# Patient Record
Sex: Male | Born: 1940 | Race: White | Hispanic: No | Marital: Married | Smoking: Former smoker
Health system: Southern US, Community
[De-identification: ages and names within clinical notes are randomized; demographics above are authoritative.]

## PROBLEM LIST (undated history)

## (undated) DIAGNOSIS — Z9889 Other specified postprocedural states: Secondary | ICD-10-CM

## (undated) DIAGNOSIS — M1990 Unspecified osteoarthritis, unspecified site: Secondary | ICD-10-CM

## (undated) DIAGNOSIS — C4490 Unspecified malignant neoplasm of skin, unspecified: Secondary | ICD-10-CM

## (undated) DIAGNOSIS — E119 Type 2 diabetes mellitus without complications: Secondary | ICD-10-CM

## (undated) DIAGNOSIS — I10 Essential (primary) hypertension: Secondary | ICD-10-CM

## (undated) DIAGNOSIS — J309 Allergic rhinitis, unspecified: Secondary | ICD-10-CM

## (undated) DIAGNOSIS — N189 Chronic kidney disease, unspecified: Secondary | ICD-10-CM

## (undated) DIAGNOSIS — G4733 Obstructive sleep apnea (adult) (pediatric): Secondary | ICD-10-CM

## (undated) DIAGNOSIS — E039 Hypothyroidism, unspecified: Secondary | ICD-10-CM

## (undated) DIAGNOSIS — L989 Disorder of the skin and subcutaneous tissue, unspecified: Secondary | ICD-10-CM

## (undated) DIAGNOSIS — Z9989 Dependence on other enabling machines and devices: Secondary | ICD-10-CM

## (undated) DIAGNOSIS — R42 Dizziness and giddiness: Secondary | ICD-10-CM

## (undated) DIAGNOSIS — I4891 Unspecified atrial fibrillation: Secondary | ICD-10-CM

## (undated) DIAGNOSIS — R112 Nausea with vomiting, unspecified: Secondary | ICD-10-CM

## (undated) HISTORY — PX: TONSILLECTOMY: SUR1361

## (undated) HISTORY — PX: EYE SURGERY: SHX253

## (undated) HISTORY — PX: CARDIAC CATHETERIZATION: SHX172

## (undated) HISTORY — DX: Dependence on other enabling machines and devices: Z99.89

## (undated) HISTORY — DX: Type 2 diabetes mellitus without complications: E11.9

## (undated) HISTORY — DX: Obstructive sleep apnea (adult) (pediatric): G47.33

## (undated) HISTORY — PX: KNEE ARTHROSCOPY: SUR90

## (undated) HISTORY — DX: Unspecified atrial fibrillation: I48.91

## (undated) HISTORY — DX: Allergic rhinitis, unspecified: J30.9

---

## 1978-11-20 HISTORY — PX: OTHER SURGICAL HISTORY: SHX169

## 2004-10-27 ENCOUNTER — Ambulatory Visit: Payer: Self-pay | Admitting: Cardiology

## 2004-10-27 ENCOUNTER — Ambulatory Visit: Payer: Self-pay | Admitting: Endocrinology

## 2004-10-28 ENCOUNTER — Ambulatory Visit: Payer: Self-pay | Admitting: Internal Medicine

## 2004-11-24 ENCOUNTER — Ambulatory Visit: Payer: Self-pay | Admitting: Internal Medicine

## 2004-12-15 ENCOUNTER — Ambulatory Visit: Payer: Self-pay | Admitting: Internal Medicine

## 2005-01-05 ENCOUNTER — Ambulatory Visit: Payer: Self-pay | Admitting: *Deleted

## 2005-01-18 ENCOUNTER — Ambulatory Visit: Payer: Self-pay | Admitting: Endocrinology

## 2005-01-25 ENCOUNTER — Ambulatory Visit: Payer: Self-pay | Admitting: Cardiology

## 2005-01-26 ENCOUNTER — Ambulatory Visit: Payer: Self-pay | Admitting: Endocrinology

## 2005-01-27 ENCOUNTER — Ambulatory Visit: Payer: Self-pay | Admitting: Endocrinology

## 2005-02-09 ENCOUNTER — Ambulatory Visit: Payer: Self-pay | Admitting: Endocrinology

## 2005-02-14 ENCOUNTER — Ambulatory Visit: Payer: Self-pay | Admitting: Internal Medicine

## 2005-02-15 ENCOUNTER — Ambulatory Visit: Payer: Self-pay | Admitting: Internal Medicine

## 2005-02-15 ENCOUNTER — Ambulatory Visit: Payer: Self-pay

## 2005-02-22 ENCOUNTER — Ambulatory Visit: Payer: Self-pay | Admitting: *Deleted

## 2005-03-22 ENCOUNTER — Ambulatory Visit: Payer: Self-pay | Admitting: *Deleted

## 2005-04-06 ENCOUNTER — Ambulatory Visit: Payer: Self-pay | Admitting: Internal Medicine

## 2005-04-28 ENCOUNTER — Ambulatory Visit: Payer: Self-pay | Admitting: Cardiology

## 2005-06-19 ENCOUNTER — Ambulatory Visit: Payer: Self-pay | Admitting: Cardiology

## 2005-07-11 ENCOUNTER — Ambulatory Visit: Payer: Self-pay | Admitting: Cardiology

## 2005-07-17 ENCOUNTER — Ambulatory Visit: Payer: Self-pay | Admitting: Cardiology

## 2005-08-07 ENCOUNTER — Ambulatory Visit: Payer: Self-pay | Admitting: Cardiology

## 2005-09-04 ENCOUNTER — Ambulatory Visit: Payer: Self-pay | Admitting: Internal Medicine

## 2005-09-05 ENCOUNTER — Ambulatory Visit: Payer: Self-pay | Admitting: Endocrinology

## 2005-09-27 ENCOUNTER — Ambulatory Visit: Payer: Self-pay | Admitting: Endocrinology

## 2005-10-05 ENCOUNTER — Ambulatory Visit: Payer: Self-pay | Admitting: Endocrinology

## 2005-10-09 ENCOUNTER — Ambulatory Visit: Payer: Self-pay | Admitting: Cardiology

## 2005-10-24 ENCOUNTER — Ambulatory Visit: Payer: Self-pay | Admitting: Cardiology

## 2005-10-26 ENCOUNTER — Ambulatory Visit: Payer: Self-pay | Admitting: Endocrinology

## 2005-11-27 ENCOUNTER — Ambulatory Visit: Payer: Self-pay | Admitting: Cardiology

## 2005-12-18 ENCOUNTER — Ambulatory Visit: Payer: Self-pay | Admitting: Cardiology

## 2005-12-28 ENCOUNTER — Ambulatory Visit: Payer: Self-pay | Admitting: *Deleted

## 2006-01-25 ENCOUNTER — Ambulatory Visit: Payer: Self-pay | Admitting: Cardiology

## 2006-02-22 ENCOUNTER — Ambulatory Visit: Payer: Self-pay | Admitting: Cardiology

## 2006-03-26 ENCOUNTER — Ambulatory Visit: Payer: Self-pay | Admitting: Cardiology

## 2006-04-23 ENCOUNTER — Ambulatory Visit: Payer: Self-pay | Admitting: Cardiology

## 2006-05-15 ENCOUNTER — Ambulatory Visit: Payer: Self-pay | Admitting: Cardiovascular Disease

## 2006-06-12 ENCOUNTER — Ambulatory Visit: Payer: Self-pay | Admitting: *Deleted

## 2006-06-26 ENCOUNTER — Ambulatory Visit: Payer: Self-pay | Admitting: Cardiology

## 2006-07-24 ENCOUNTER — Ambulatory Visit: Payer: Self-pay | Admitting: Cardiovascular Disease

## 2006-08-21 ENCOUNTER — Ambulatory Visit: Payer: Self-pay | Admitting: *Deleted

## 2006-09-11 ENCOUNTER — Ambulatory Visit: Payer: Self-pay | Admitting: Internal Medicine

## 2006-09-19 ENCOUNTER — Ambulatory Visit: Payer: Self-pay | Admitting: Internal Medicine

## 2006-09-20 ENCOUNTER — Ambulatory Visit (HOSPITAL_BASED_OUTPATIENT_CLINIC_OR_DEPARTMENT_OTHER): Admission: RE | Admit: 2006-09-20 | Discharge: 2006-09-20 | Payer: Self-pay | Admitting: Internal Medicine

## 2006-09-23 ENCOUNTER — Ambulatory Visit: Payer: Self-pay | Admitting: Internal Medicine

## 2006-10-16 ENCOUNTER — Ambulatory Visit: Payer: Self-pay | Admitting: Internal Medicine

## 2006-10-30 ENCOUNTER — Encounter (HOSPITAL_BASED_OUTPATIENT_CLINIC_OR_DEPARTMENT_OTHER): Admission: RE | Admit: 2006-10-30 | Discharge: 2006-11-08 | Payer: Self-pay | Admitting: Surgery

## 2006-11-23 ENCOUNTER — Encounter (HOSPITAL_BASED_OUTPATIENT_CLINIC_OR_DEPARTMENT_OTHER): Admission: RE | Admit: 2006-11-23 | Discharge: 2007-02-21 | Payer: Self-pay | Admitting: Internal Medicine

## 2007-04-16 ENCOUNTER — Ambulatory Visit: Payer: Self-pay | Admitting: Internal Medicine

## 2008-04-10 DIAGNOSIS — J302 Other seasonal allergic rhinitis: Secondary | ICD-10-CM

## 2008-04-10 DIAGNOSIS — J4540 Moderate persistent asthma, uncomplicated: Secondary | ICD-10-CM | POA: Insufficient documentation

## 2008-04-10 DIAGNOSIS — J3089 Other allergic rhinitis: Secondary | ICD-10-CM

## 2008-04-14 ENCOUNTER — Ambulatory Visit: Payer: Self-pay | Admitting: Internal Medicine

## 2008-04-23 DIAGNOSIS — G4733 Obstructive sleep apnea (adult) (pediatric): Secondary | ICD-10-CM

## 2008-04-23 DIAGNOSIS — E119 Type 2 diabetes mellitus without complications: Secondary | ICD-10-CM

## 2009-04-14 ENCOUNTER — Ambulatory Visit: Payer: Self-pay | Admitting: Internal Medicine

## 2009-06-02 ENCOUNTER — Telehealth (INDEPENDENT_AMBULATORY_CARE_PROVIDER_SITE_OTHER): Payer: Self-pay | Admitting: *Deleted

## 2010-04-13 ENCOUNTER — Ambulatory Visit: Payer: Self-pay | Admitting: Internal Medicine

## 2010-06-20 ENCOUNTER — Encounter: Payer: Self-pay | Admitting: Cardiology

## 2010-07-06 ENCOUNTER — Encounter: Payer: Self-pay | Admitting: Cardiology

## 2010-07-12 DIAGNOSIS — I10 Essential (primary) hypertension: Secondary | ICD-10-CM | POA: Insufficient documentation

## 2010-07-12 DIAGNOSIS — I4891 Unspecified atrial fibrillation: Secondary | ICD-10-CM

## 2010-07-12 DIAGNOSIS — E785 Hyperlipidemia, unspecified: Secondary | ICD-10-CM

## 2010-07-13 ENCOUNTER — Ambulatory Visit: Payer: Self-pay | Admitting: Cardiology

## 2010-07-13 DIAGNOSIS — R0609 Other forms of dyspnea: Secondary | ICD-10-CM

## 2010-07-14 ENCOUNTER — Ambulatory Visit: Payer: Self-pay | Admitting: Cardiology

## 2010-07-14 ENCOUNTER — Encounter (INDEPENDENT_AMBULATORY_CARE_PROVIDER_SITE_OTHER): Payer: Self-pay | Admitting: *Deleted

## 2010-07-14 LAB — CONVERTED CEMR LAB
Basophils Relative: 1.2 % (ref 0.0–3.0)
CO2: 26 meq/L (ref 19–32)
Calcium: 9.2 mg/dL (ref 8.4–10.5)
Chloride: 105 meq/L (ref 96–112)
Creatinine, Ser: 1.5 mg/dL (ref 0.4–1.5)
Eosinophils Absolute: 0.3 10*3/uL (ref 0.0–0.7)
Eosinophils Relative: 3.1 % (ref 0.0–5.0)
Hemoglobin: 15.3 g/dL (ref 13.0–17.0)
INR: 1.2 — ABNORMAL HIGH (ref 0.8–1.0)
Lymphocytes Relative: 28.7 % (ref 12.0–46.0)
MCHC: 33.8 g/dL (ref 30.0–36.0)
MCV: 95.2 fL (ref 78.0–100.0)
Neutro Abs: 5.1 10*3/uL (ref 1.4–7.7)
Neutrophils Relative %: 59.9 % (ref 43.0–77.0)
Prothrombin Time: 12.7 s — ABNORMAL HIGH (ref 9.7–11.8)
RBC: 4.75 M/uL (ref 4.22–5.81)
Sodium: 138 meq/L (ref 135–145)
WBC: 8.6 10*3/uL (ref 4.5–10.5)

## 2010-07-20 ENCOUNTER — Inpatient Hospital Stay (HOSPITAL_BASED_OUTPATIENT_CLINIC_OR_DEPARTMENT_OTHER): Admission: RE | Admit: 2010-07-20 | Discharge: 2010-07-20 | Payer: Self-pay | Admitting: Cardiovascular Disease

## 2010-07-20 ENCOUNTER — Ambulatory Visit: Payer: Self-pay | Admitting: Cardiovascular Disease

## 2010-08-16 ENCOUNTER — Ambulatory Visit: Payer: Self-pay | Admitting: Cardiology

## 2010-08-17 ENCOUNTER — Encounter: Payer: Self-pay | Admitting: Cardiology

## 2010-09-22 ENCOUNTER — Ambulatory Visit: Payer: Self-pay | Admitting: Internal Medicine

## 2010-10-20 HISTORY — PX: JOINT REPLACEMENT: SHX530

## 2010-11-02 ENCOUNTER — Inpatient Hospital Stay (HOSPITAL_COMMUNITY)
Admission: RE | Admit: 2010-11-02 | Discharge: 2010-11-06 | Payer: Self-pay | Source: Home / Self Care | Attending: Orthopedic Surgery | Admitting: Orthopedic Surgery

## 2010-12-20 NOTE — Medication Information (Signed)
Summary: [facility] Medication Refill Log   [facility] Medication Refill Log   Imported By: Roderic Ovens 09/22/2010 12:21:47  _____________________________________________________________________  External Attachment:    Type:   Image     Comment:   External Document

## 2010-12-20 NOTE — Assessment & Plan Note (Signed)
Summary: SOB/ MBW   Copy to:  Buren Kos Primary Provider/Referring Provider:  Buren Kos  CC:  Follow up pt c/o increase sob with exertion and cardiac catherization in sept 2011.  History of Present Illness: 04/14/09- OSA, allergic rhinitis, asthma Asks a bout increasing cpap. He wakes unrested more often. Using a full face mask.  Frequent colds this winter- only a little asthma. Got through Spring pollen with continuous use of all meds- reviewed. Controls edema with support hose and diuretics. Currently feels very well.  Apr 13, 2010- OSA, allergic rhinitis, asthma, AF/ Pradaxa No active asthma, except a flare after climbing stairs in [place]- rescue inhaler worked. After that he used rescue inhaler ahead of exertion for rest of trip to be safe. Ambulation limited by his arthritis and DM. He asks I fill celebrex til he can see Dr [name] next month. Spring pollen cause watery rhinorhea- controlled with Claritin then.  Had a sustained flu syndrome last winter- did get flu shot. Sleep apnea- Uses CPAP 12 almost every night. He doesn't take the cpap on trips out of town and we discussed this. Pressure setting now is good.  September 22, 2010- OSA, allergic rhinitis, asthma, AF/ Pradaxa Nurse-CC: Follow up pt c/o increase sob with exertion, cardiac catherization in sept 2011 Fully compliant with CPAP w/o change. Had been exercising regularly in July. On return from [employer] ad asthma episode managed with his meds. After that never seemed to get over lingering dyspnea, worse than ususal. Had heart cath in September"OK".- widely patent coronaries, EF 55%. EKG-AFib w/ slow ventricular response. CXR for routine physical was ok. Pending knee surgery in December. Now nasal congestion- uses Neti pot. Notes mainly exertional dyspnea w/o wheeze or cough. Not anemic.     Asthma History    Initial Asthma Severity Rating:    Age range: 12+ years    Symptoms: daily    Nighttime Awakenings: 0-2/month    Interferes w/ normal activity: minor limitations    SABA use (not for EIB): >2 days/week but not >1X/day    Asthma Severity Assessment: Moderate Persistent   Preventive Screening-Counseling & Management  Alcohol-Tobacco     Smoking Status: quit     Packs/Day: 1.0     Year Started: 1956     Year Quit: 1991  Medications Prior to Update: 1)  Potassium Chloride Crys Cr 20 Meq Tbcr (Potassium Chloride Crys Cr) .... Take 1 Tablet By Mouth Once A Day 2)  Pradaxa 150 Mg Caps (Dabigatran Etexilate Mesylate) .[name].. 1 Cap Two Times A Day 3)  Lipitor 80 Mg  Tabs (Atorvastatin Calcium) .... Take 1 By Mouth Once Daily 4)  Singulair 10 Mg  Tabs (Montelukast Sodium) .... 2 Tabs At Bedtime 5)  Coq10 100 Mg  Caps (Coenzyme Q10) .... Take Once Daily 6)  Hydrochlorothiazide 12.5 Mg  Tabs (Hydrochlorothiazide) .... Take 1 By Mouth Once Daily 7)  Accupril 40 Mg  Tabs (Quinapril Hcl) .... 2 Tab At Bedtime 8)  Furosemide 20 Mg Tabs (Furosemide) .[name].. 1 Tab Once Daily 9)  Metformin Hcl 500 Mg Tabs (Metformin Hcl) .... Take 1 Tablet By Mouth Two Times A Day 10)  Victoza 18 Mg/11ml Soln (Liraglutide) .... 1.8 Units Once Daily 11)  Protonix 20 Mg  Tbec (Pantoprazole Sodium) .... Two Times A Day 12)  Advair Diskus 250-50 Mcg/dose  Misc (Fluticasone-Salmeterol) .[name].. 1 Puff Two Times A Day As Needed 13)  Proair Hfa 108 (90 Base) Mcg/act  Aers (Albuterol Sulfate) .... 2  Puffs Four Times A Day As Needed 14)  Cpap 12  [facility] .... At Bedtime 15)  Skelaxin 800 Mg  Tabs (Metaxalone) .... As Needed 16)  Endocet 7.5-325 Mg  Tabs (Oxycodone-Acetaminophen) .... As Needed 17)  Verelan Pm 300 Mg  Cp24 (Verapamil Hcl) .... Once Daily 18)  Androgel Pump 1 %  Gel (Testosterone) .... Once Daily 19)  Creon 10 33.12-31-35.5 Mu  Cpep (Amylase-Lipase-Protease) .... Once Daily 20)  Astelin 137 Mcg/spray  Soln (Azelastine Hcl) .... Once Daily 21)  Fluticasone Propionate 50 Mcg/act  Susp (Fluticasone Propionate) .... Once  Daily 22)  Vitamin B-12 100 Mcg  Tabs (Cyanocobalamin) .... Once Daily 23)  Safflower Caps .Marland KitchenMarland Kitchen. 1 Cap Two Times A Day 24)  Accuflora .... Once Daily 25)  Metoprolol Succinate 100 Mg Xr24h-Tab (Metoprolol Succinate) .... Take 1 Tablet By Mouth Two Times A Day 26)  Cinnamon/chrouim .... Take 1 By Mouth Once Daily 27)  Synthroid 50 Mcg Tabs (Levothyroxine Sodium) .[name].. 1 Tab Once Daily 28)  Meloxicam 15 Mg Tabs (Meloxicam) .[name].. 1 Tab Once Daily 29)  Qualaquin 324 Mg Caps (Quinine Sulfate) .[name].. 1 Tab At Bedtime As Needed  Allergies (verified): No Known Drug Allergies  Past History:  Past Medical History: Last updated: 04/14/2008 Allergic Rhinitis Asthma Atrial Fibrillation Sleep Apnea Diabetes, Type 2  Past Surgical History: Last updated: 04/14/2009 Knee arthroscopy Tonsillectomy repair lacerated finger Cardiac cath  Family History: Last updated: 04/14/2008 emphysema-father heart disease-mpther,father rheumatism-father cancer-mother-breast mother died at 27 of respiratory failure father died at 18 of respiratory failure sister-71-hypertension brother-64 emphysema  Social History: Last updated: 04/13/2010 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 remote  Risk Factors: Alcohol Use: 2 (04/14/2008)  Risk Factors: Smoking Status: quit (09/22/2010) Packs/Day: 1.0 (09/22/2010)  Social History: Smoking Status:  quit Packs/Day:  1.0  Review of Systems      See HPI       The patient complains of dyspnea on exertion and peripheral edema.  The patient denies anorexia, fever, weight loss, weight gain, vision loss, decreased hearing, hoarseness, chest pain, syncope, prolonged cough, headaches, hemoptysis, abdominal pain, melena, severe indigestion/heartburn, muscle weakness, enlarged lymph nodes, and angioedema.    Vital Signs:  Patient profile:   70 year old male Height:      68 inches Weight:      259.4  pounds BMI:     39.58 O2 Sat:      97 % on Room air Pulse rate:   51 / minute BP sitting:   130 / 78  (left arm) Cuff size:   large  Vitals Entered By: Renold Genta RCP, LPN (September 22, 2010 11:08 AM)  O2 Flow:  Room air CC: Follow up pt c/o increase sob with exertion, cardiac catherization in sept 2011 Comments Medications reviewed with patient Renold Genta RCP, LPN  September 22, 2010 11:13 AM    Physical Exam  Additional Exam:  General: A/Ox3; pleasant and cooperative, NAD,  SKIN: no rash, lesions NODES: no lymphadenopathy HEENT: [place]/AT, EOM- WNL, Conjuctivae- clear, PERRLA, TM-WNL, Nose- clear, Throat- clear and wnl, Mallampati III-IV NECK: Supple w/ fair ROM, JVD- none, normal carotid impulses w/o bruits Thyroid- , no stridor CHEST: Clear to P&A HEART: RRR- slow but regular, no m/g/r heard ABDOMEN: overweight ZOX:WRUE, nl pulses, cane, 1+ edema L>R NEURO: Grossly intact to observation      Impression & Recommendations:  Problem # 1:  DYSPNEA (ICD-786.05)  Dyspnea  with change of pattern since [place] trip. Negative Homan's. I will check D-dimer. He is getting better over time, and I will ok him for planned knee surgery at this point. Note that resting sat is now 97% w/ no wheeze and negative Homan's. His heart cath didn't see any redflags. Inhaler helps some, but not enough. We will try to get a 6 MWT if his  knees can do that.       ADDENDUM: 6 minute Walk Test: done today- 98% at start, 96% at end, 100% after 2 minutes rest; no stops. Good oxygenation.                    Conclusion: Dyspnea probably from deconditioning and obesity. No contraindication to knee surgery.  Had flu vax.  Other Orders: Est. Patient Level IV (11914) T-D-Dimer Fibrin Derivatives Quantitive ([id number]) Pulmonary Stress (6 min walk) (86578)  Patient Instructions: 1)  Please schedule a follow-up appointment in 6 months. Call sooner as needed. 2)  Lab 3)  6 Minute walk  test   Six Minute Walk Test Medications taken before test(dose and time): 1)  Potassium Chloride Crys Cr 20 Meq Tbcr (Potassium Chloride Crys Cr) .... Take 1 Tablet By Mouth Once A Day 2)  Pradaxa 150 Mg Caps (Dabigatran Etexilate Mesylate) .[name].. 1 Cap Two Times A Day 3)  Lipitor 80 Mg  Tabs (Atorvastatin Calcium) .... Take 1 By Mouth Once Daily 4)  Singulair 10 Mg  Tabs (Montelukast Sodium) .... 2 Tabs At Bedtime 5)  Coq10 100 Mg  Caps (Coenzyme Q10) .... Take Once Daily 6)  Hydrochlorothiazide 12.5 Mg  Tabs (Hydrochlorothiazide) .... Take 1 By Mouth Once Daily 7)  Accupril 40 Mg  Tabs (Quinapril Hcl) .... 2 Tab At Bedtime 8)  Furosemide 20 Mg Tabs (Furosemide) .[name].. 1 Tab Once Daily 9)  Metformin Hcl 500 Mg Tabs (Metformin Hcl) .... Take 1 Tablet By Mouth Two Times A Day 10)  Victoza 18 Mg/63ml Soln (Liraglutide) .... 1.8 Units Once Daily 11)  Protonix 20 Mg  Tbec (Pantoprazole Sodium) .... Two Times A Day 12)  Advair Diskus 250-50 Mcg/dose  Misc (Fluticasone-Salmeterol) .[name].. 1 Puff Two Times A Day As Needed 13)  Proair Hfa 108 (90 Base) Mcg/act  Aers (Albuterol Sulfate) .... 2 Puffs Four Times A Day As Needed 14)  Cpap 12  [facility] .... At Bedtime 15)  Skelaxin 800 Mg  Tabs (Metaxalone) .... As Needed 16)  Endocet 7.5-325 Mg  Tabs (Oxycodone-Acetaminophen) .... As Needed 17)  Verelan Pm 300 Mg  Cp24 (Verapamil Hcl) .... Once Daily 18)  Androgel Pump 1 %  Gel (Testosterone) .... Once Daily 19)  Creon 10 33.12-31-35.5 Mu  Cpep (Amylase-Lipase-Protease) .... Once Daily 20)  Astelin 137 Mcg/spray  Soln (Azelastine Hcl) .... Once Daily 21)  Fluticasone Propionate 50 Mcg/act  Susp (Fluticasone Propionate) .... Once Daily 22)  Vitamin B-12 100 Mcg  Tabs (Cyanocobalamin) .... Once Daily 23)  Safflower Caps .Marland KitchenMarland Kitchen. 1 Cap Two Times A Day PT TOOK ALL AM MEDS AT APPROX 8AM TODAY Supplemental oxygen during the test: No Flow: Room air  Lap counter(place a tick mark inside a square for each  lap completed) lap 1 complete  lap 2 complete   lap 3 complete   lap 4 complete  lap 5 complete  lap 6 complete  lap 7 complete    Baseline  BP sitting: 130/ 78 Heart rate: 51 Dyspnea ( Borg scale) 0 Fatigue (Borg scale) 1  SPO2 98  End Of Test  BP sitting: 140/ 82 Heart rate: 78 Dyspnea ( Borg scale) 0.5 Fatigue (Borg scale) 1 SPO2 96  2 Minutes post  BP sitting: 140/ 84 Heart rate: 56 SPO2 100  Stopped or paused before six minutes? No  Interpretation: Number of laps  7 X 48 meters =   336 meters =    336 meters   Total distance walked in six minutes: 336 meters  Tech ID: Tivis Ringer, CNA (September 22, 2010 4:04 PM) Tech Comments Pt completed test w/ 0 rest breaks and 0 complaints. Armeta performed test and I am documenting this for her.

## 2010-12-20 NOTE — Assessment & Plan Note (Signed)
Summary: eph/jml   Visit Type:  EPH Referring Provider:  Buren Kos Primary Provider:  Buren Kos  CC:  pt states he is having Right total knee replacement in 10/2010 w/Dr. Lequita Halt....sob....denies any cp or edema.Colton KitchenMarland Kitchenpt has lost 11 lb's since 06/2010.  History of Present Illness: Colton Brooks returns today after having a cardiac catheterization. He had widely patent coronary vessels. His chest discomfort and/or his shortness of breath developing noncardiac. He had a normal left ventricular EF of 55%. There were no wall motion analysis.  He needs preop clearance for a right total knee replacement by Dr. Despina Hick in December. He will have his left knee done after that. He is also cleared a low operative risk. He will have to stop his Pradaxa 4 days prior to surgery.  Clinical Reports Reviewed:  Cardiac Cath:  07/20/2010: Cardiac Cath Findings:   Left ventriculography performed in the RAO projection shows normal LV   function.  The EF is estimated at 55%.  There are no regional wall   motion abnormalities.      Coronary angiography.  The right coronary artery is smooth throughout   its course.  It is a dominant vessel that supplies a PDA branch as well   as 2 posterolateral branches.  The first posterolateral branch is   moderate. The second PL is small.  PDA is fairly large.  The RCA is   smooth throughout its course and there appears to be no luminal   irregularity or significant obstructive disease.      Left mainstem.  Widely patent.  Divides into the LAD and left   circumflex.      LAD:  The LAD is also smooth throughout its course.  There are no   luminal irregularities or significant stenoses identified.  The LAD   gives off two moderate-sized diagonal branches, both of which are   patent.      Left circumflex:  Left circumflex is also patent throughout its course.   There is no obstructive disease identified.  Circumflex gives off its   largest branch in an OM-2 territory.        ASSESSMENT:   1. Widely patent coronary arteries.   2. Normal left ventricular function.               Colton Brooks. Excell Seltzer, MD       Current Medications (verified): 1)  Potassium Chloride Crys Cr 20 Meq Tbcr (Potassium Chloride Crys Cr) .... Take 1 Tablet By Mouth Once A Day 2)  Pradaxa 150 Mg Caps (Dabigatran Etexilate Mesylate) .Colton Brooks.. 1 Cap Two Times A Day 3)  Lipitor 80 Mg  Tabs (Atorvastatin Calcium) .... Take 1 By Mouth Once Daily 4)  Singulair 10 Mg  Tabs (Montelukast Sodium) .... 2 Tabs At Bedtime 5)  Coq10 100 Mg  Caps (Coenzyme Q10) .... Take Once Daily 6)  Hydrochlorothiazide 12.5 Mg  Tabs (Hydrochlorothiazide) .... Take 1 By Mouth Once Daily 7)  Accupril 40 Mg  Tabs (Quinapril Hcl) .... 2 Tab At Bedtime 8)  Furosemide 20 Mg Tabs (Furosemide) .Colton Brooks.. 1 Tab Once Daily 9)  Metformin Hcl 500 Mg Tabs (Metformin Hcl) .... Take 1 Tablet By Mouth Two Times A Day 10)  Victoza 18 Mg/45ml Soln (Liraglutide) .... 1.8 Units Once Daily 11)  Protonix 20 Mg  Tbec (Pantoprazole Sodium) .... Two Times A Day 12)  Advair Diskus 250-50 Mcg/dose  Misc (Fluticasone-Salmeterol) .Colton Brooks.. 1 Puff Two Times A Day As Needed 13)  Proair Hfa 108 (  90 Base) Mcg/act  Aers (Albuterol Sulfate) .... 2 Puffs Four Times A Day As Needed 14)  Cpap 12  [facility] .... At Bedtime 15)  Skelaxin 800 Mg  Tabs (Metaxalone) .... As Needed 16)  Endocet 7.5-325 Mg  Tabs (Oxycodone-Acetaminophen) .... As Needed 17)  Verelan Pm 300 Mg  Cp24 (Verapamil Hcl) .... Once Daily 18)  Androgel Pump 1 %  Gel (Testosterone) .... Once Daily 19)  Creon 10 33.12-31-35.5 Mu  Cpep (Amylase-Lipase-Protease) .... Once Daily 20)  Astelin 137 Mcg/spray  Soln (Azelastine Hcl) .... Once Daily 21)  Fluticasone Propionate 50 Mcg/act  Susp (Fluticasone Propionate) .... Once Daily 22)  Vitamin B-12 100 Mcg  Tabs (Cyanocobalamin) .... Once Daily 23)  Safflower Caps .Colton KitchenMarland Brooks. 1 Cap Two Times A Day 24)  Accuflora .... Once Daily 25)  Metoprolol  Succinate 100 Mg Xr24h-Tab (Metoprolol Succinate) .... Take 1 Tablet By Mouth Two Times A Day 26)  Cinnamon/chrouim .... Take 1 By Mouth Once Daily 27)  Synthroid 50 Mcg Tabs (Levothyroxine Sodium) .Colton Brooks.. 1 Tab Once Daily 28)  Meloxicam 15 Mg Tabs (Meloxicam) .Colton Brooks.. 1 Tab Once Daily 29)  Qualaquin 324 Mg Caps (Quinine Sulfate) .Colton Brooks.. 1 Tab At Bedtime As Needed  Allergies (verified): No Known Drug Allergies  Past History:  Past Medical History: Last updated: 04/14/2008 Allergic Rhinitis Asthma Atrial Fibrillation Sleep Apnea Diabetes, Type 2  Past Surgical History: Last updated: 04/14/2009 Knee arthroscopy Tonsillectomy repair lacerated finger Cardiac cath  Family History: Last updated: 04/14/2008 emphysema-father heart disease-mpther,father rheumatism-father cancer-mother-breast mother died at 24 of respiratory failure father died at 5 of respiratory failure sister-71-hypertension brother-64 emphysema  Social History: Last updated: 04/13/2010 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 No problem catheter site.  Vital Signs:  Patient profile:   70 year old male Height:      68 inches Weight:      248 pounds BMI:     37.84 Pulse rate:   58 / minute Pulse rhythm:   irregular BP sitting:   144 / 90  (left arm) Cuff size:   large  Vitals Entered By: Danielle Rankin, CMA (August 16, 2010 12:08 PM)  Physical Exam  General:  obese.   Head:  normocephalic and atraumatic Eyes:  PERRLA/EOM intact; conjunctiva and lids normal. Neck:  Neck supple, no JVD. No masses, thyromegaly or abnormal cervical nodes. Chest Wall:  no deformities or breast masses noted Lungs:  Clear bilaterally to auscultation and percussion. Heart:  PMI  nondisplaced, regular rate and rhythm, normal S1-S2, carotids full without bruit Msk:  decreased ROM.   Pulses:  pulses normal in all 4 extremities Extremities:  No clubbing or cyanosis. Neurologic:  Alert and oriented x 3. Skin:  Intact without lesions or rashes. Psych:  Normal affect.   EKG  Procedure date:  08/16/2010  Findings:      chronic atrial fibrillation with a well-controlled ventricular rate  Impression & Recommendations:  Problem # 1:  PRE-OPERATIVE CARDIOVASCULAR EXAMINATION (ICD-V72.81) We have given the patient a letter clearing him for surgery at low risk. Followup in 2 years. The following medications were removed from the medication list:    Aspirin Ec 325 Mg Tbec (Aspirin) .Colton Brooks... Take one tablet by mouth daily His updated medication  list for this problem includes:    Accupril 40 Mg Tabs (Quinapril hcl) .Colton Brooks... 2 tab at bedtime    Verelan Pm 300 Mg Cp24 (Verapamil hcl) ..... Once daily    Metoprolol Succinate 100 Mg Xr24h-tab (Metoprolol succinate) .Colton Brooks... Take 1 tablet by mouth two times a day  Orders: EKG w/ Interpretation (93000)  Problem # 2:  DYSPNEA (ICD-786.05) Assessment: Unchanged  The following medications were removed from the medication list:    Aspirin Ec 325 Mg Tbec (Aspirin) .Colton Brooks... Take one tablet by mouth daily His updated medication list for this problem includes:    Hydrochlorothiazide 12.5 Mg Tabs (Hydrochlorothiazide) .Colton Brooks... Take 1 by mouth once daily    Accupril 40 Mg Tabs (Quinapril hcl) .Colton Brooks... 2 tab at bedtime    Furosemide 20 Mg Tabs (Furosemide) .Colton Brooks... 1 tab once daily    Verelan Pm 300 Mg Cp24 (Verapamil hcl) ..... Once daily    Metoprolol Succinate 100 Mg Xr24h-tab (Metoprolol succinate) .Colton Brooks... Take 1 tablet by mouth two times a day  Problem # 3:  ATRIAL FIBRILLATION, CHRONIC (ICD-427.31) Assessment: Unchanged Stop Pradaxa 4 days prior to surgery. The following medications were removed from the medication list:    Aspirin Ec 325 Mg  Tbec (Aspirin) .Colton Brooks... Take one tablet by mouth daily His updated medication list for this problem includes:    Metoprolol Succinate 100 Mg Xr24h-tab (Metoprolol succinate) .Colton Brooks... Take 1 tablet by mouth two times a day  Patient Instructions: 1)  Your physician recommends that you schedule a follow-up appointment in: 2 years 2)  Your physician recommends that you continue on your current medications as directed. Please refer to the Current Medication list given to you today. 3)  Stop Pradaxa 4 days prior to knee surgery.

## 2010-12-20 NOTE — Assessment & Plan Note (Signed)
Summary: ec6/last seen in 06 by Dr wall/DOE/ o.k. per tw to doubale bo...   Visit Type:  Surg clearance Referring Leighanna Kirn:  Buren Kos Primary Keyaan Lederman:  Buren Kos  CC:  sob.....denies any cp....pt states he is having bilateral total knee replacements w/ Dr. Lequita Halt.  History of Present Illness: Mr Vanbenschoten is a 70 year old gentleman that I saw all over 5 years ago for chronic atrial fibrillation.  Referred now by Dr. [name] with new onset dyspnea on exertion.  He's always been mildly short of breath with activity though he is very active. The last couple weeks he's had increased dyspnea on exertion to the point he cannot do what he normally does.he denies orthopnea, PND or edema.  His multiple cardiac risk factors including diabetes. He is very compliant with his medications.  He has not had a stress test and number of years since moving here from [place]. Echocardiogram in 2006 showed normal left ventricular function with ejection fraction 60% with basal inferior hypokinesia.  Current Medications (verified): 1)  Potassium Chloride Crys Cr 20 Meq Tbcr (Potassium Chloride Crys Cr) .... Take 1 Tablet By Mouth Once A Day 2)  Pradaxa 75 Mg Caps (Dabigatran Etexilate Mesylate) .... Take As Directed 3)  Lipitor 80 Mg  Tabs (Atorvastatin Calcium) .... Take 1 By Mouth Once Daily 4)  Singulair 10 Mg  Tabs (Montelukast Sodium) .... Take 1 By Mouth Two Times A Day 5)  Coq10 100 Mg  Caps (Coenzyme Q10) .... Take Once Daily 6)  Joint Juice .... Take As Directed 7)  Hydrochlorothiazide 12.5 Mg  Tabs (Hydrochlorothiazide) .... Take 1 By Mouth Once Daily 8)  Accupril 40 Mg  Tabs (Quinapril Hcl) .... Take 1 By Mouth Two Times A Day 9)  Furosemide 20 Mg Tabs (Furosemide) .[name].. 1 Tab Once Daily 10)  Metformin Hcl 500 Mg Tabs (Metformin Hcl) .... Take 1 Tablet By Mouth Two Times A Day 11)  Victoza 18 Mg/80ml Soln (Liraglutide) .... 1.8 Units Once Daily 12)  Protonix 20 Mg  Tbec (Pantoprazole Sodium)  .... Two Times A Day 13)  Advair Diskus 250-50 Mcg/dose  Misc (Fluticasone-Salmeterol) .[name].. 1 Puff Two Times A Day As Needed 14)  Proair Hfa 108 (90 Base) Mcg/act  Aers (Albuterol Sulfate) .... 2 Puffs Four Times A Day As Needed 15)  Cpap 12  [facility] .... At Bedtime 16)  Skelaxin 800 Mg  Tabs (Metaxalone) .... As Needed 17)  Endocet 7.5-325 Mg  Tabs (Oxycodone-Acetaminophen) .... As Needed 18)  Verelan Pm 300 Mg  Cp24 (Verapamil Hcl) .... Once Daily 19)  Androgel Pump 1 %  Gel (Testosterone) .... Once Daily 20)  Creon 10 33.12-31-35.5 Mu  Cpep (Amylase-Lipase-Protease) .... Once Daily 21)  Astelin 137 Mcg/spray  Soln (Azelastine Hcl) .... Once Daily 22)  Fluticasone Propionate 50 Mcg/act  Susp (Fluticasone Propionate) .... Once Daily 23)  Vitamin B-12 100 Mcg  Tabs (Cyanocobalamin) .... Once Daily 24)  [place] 300 Mg  Caps ([place]) .... Once Daily 25)  Safflower Oil   Oil (Safflower Oil) .... Once Daily 26)  Accuflora .... Once Daily 27)  Celebrex 200 Mg Caps (Celecoxib) .... Take 1 Tablet By Mouth Once A Day.as Needed 28)  Metoprolol Succinate 100 Mg Xr24h-Tab (Metoprolol Succinate) .... Take 1 Tablet By Mouth Two Times A Day 29)  Cinnamon/chrouim .... Take 1 By Mouth Once Daily 30)  Synthroid 50 Mcg Tabs (Levothyroxine Sodium) .[name].. 1 Tab Once Daily 31)  Meloxicam 15 Mg Tabs (Meloxicam) .Marland KitchenMarland KitchenMarland Kitchen  1 Tab Once Daily 32)  Qualaquin 324 Mg Caps (Quinine Sulfate) .[name].. 1 Tab At Bedtime As Needed  Allergies (verified): No Known Drug Allergies  Past History:  Past Medical History: Last updated: 04/14/2008 Allergic Rhinitis Asthma Atrial Fibrillation Sleep Apnea Diabetes, Type 2  Past Surgical History: Last updated: 04/14/2009 Knee arthroscopy Tonsillectomy repair lacerated finger Cardiac cath  Family History: Last updated: 04/14/2008 emphysema-father heart disease-mpther,father rheumatism-father cancer-mother-breast mother died at 23 of respiratory  failure father died at 49 of respiratory failure sister-71-hypertension brother-64 emphysema  Social History: Last updated: 04/13/2010 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 remote  Risk Factors: Alcohol Use: 2 (04/14/2008)  Risk Factors: Smoking Status: quit > 6 months (04/13/2010)  Review of Systems       negative other than history of present illness  Vital Signs:  Patient profile:   70 year old male Height:      68 inches Weight:      259.12 pounds BMI:     39.54 Pulse rate:   58 / minute Pulse rhythm:   irregular BP sitting:   110 / 70  (left arm) Cuff size:   large  Vitals Entered By: Danielle Rankin, CMA (July 13, 2010 4:41 PM)  Physical Exam  General:  pleasant, overweight, in no acute distress Head:  normocephalic and atraumatic Eyes:  PERRLA/EOM intact; conjunctiva and lids normal. Neck:  Neck supple, no JVD. No masses, thyromegaly or abnormal cervical nodes. Chest Wall:  no deformities or breast masses noted Lungs:  Clear bilaterally to auscultation and percussion. Heart:  PMI nondisplaced, normal S1-S2, no gallop, regular rate and rhythm, chronotropic February Abdomen:  Bowel sounds positive; abdomen soft and non-tender without masses, organomegaly, or hernias noted. No hepatosplenomegaly. Msk:  Back normal, normal gait. Muscle strength and tone normal. Pulses:  pulses normal in all 4 extremities Extremities:  No clubbing or cyanosis. Neurologic:  Alert and oriented x 3. Skin:  Intact without lesions or rashes. Psych:  Normal affect.   Problems:  Medical Problems Added: 1)  Dx of Dyspnea  (ICD-786.05)  Impression & Recommendations:  Problem # 1:  DYSPNEA (ICD-786.05) Assessment New This could clearly be an ischemic equivalent with underlying coronary disease. I've recommended cardiac catheterization which he agrees to. Indication, risks, potential benefits discussed. We'll proceed early  next week. He will need to come off of Pradaxa 2 days and his metformin. His updated medication list for this problem includes:    Hydrochlorothiazide 12.5 Mg Tabs (Hydrochlorothiazide) .[name]... Take 1 by mouth once daily    Accupril 40 Mg Tabs (Quinapril hcl) .[name]... Take 1 by mouth two times a day    Furosemide 20 Mg Tabs (Furosemide) .[name]... 1 tab once daily    Verelan Pm 300 Mg Cp24 (Verapamil hcl) ..... Once daily    Metoprolol Succinate 100 Mg Xr24h-tab (Metoprolol succinate) .[name]... Take 1 tablet by mouth two times a day    Aspirin Ec 325 Mg Tbec (Aspirin) .[name]... Take one tablet by mouth daily  Problem # 2:  HYPERTENSION, HX OF (ICD-V12.50) Assessment: Improved  Orders: EKG w/ Interpretation (93000)  Problem # 3:  HYPERLIPIDEMIA (ICD-272.4)  His updated medication list for this problem includes:    Lipitor 80 Mg Tabs (Atorvastatin calcium) .[name]... Take 1 by mouth once daily  Problem # 4:  DIABETES, TYPE 2 (ICD-250.00)  The following medications were removed from the medication list:    Actos 30 Mg Tabs (Pioglitazone hcl) .[name]... Take  1 tablet by mouth once a day His updated medication list for this problem includes:    Accupril 40 Mg Tabs (Quinapril hcl) .[name]... Take 1 by mouth two times a day    Metformin Hcl 500 Mg Tabs (Metformin hcl) .[name]... Take 1 tablet by mouth two times a day    Victoza 18 Mg/57ml Soln (Liraglutide) .[name]... 1.8 units once daily    Aspirin Ec 325 Mg Tbec (Aspirin) .[name]... Take one tablet by mouth daily  Problem # 5:  SLEEP APNEA (ICD-780.57)  Problem # 6:  ATRIAL FIBRILLATION, CHRONIC (ICD-427.31) Assessment: Unchanged  His updated medication list for this problem includes:    Metoprolol Succinate 100 Mg Xr24h-tab (Metoprolol succinate) .[name]... Take 1 tablet by mouth two times a day    Aspirin Ec 325 Mg Tbec (Aspirin) .[name]... Take one tablet by mouth daily  Patient Instructions: 1)  Your physician has recommended you make the following change in your medication: STOP  PRADAXA 2 DAYS PRIOR TO HEART CATHETERIZATION.  STOP METFORMIN 24 HOURS PRIOR TO CATHETERIZATION.   START ASPIRIN  2)  Your physician recommends that you return for lab work this week prior to your cath:  cbc, pt, ptt, bmet  v58.61, 786.09, 427.31

## 2010-12-20 NOTE — Letter (Signed)
Summary: Clearance Letter  [facility], Main Office  [address]   [place], [place] [address]   Phone: [phone]  Fax: [phone]    August 16, 2010  Re:     [facility] Address:   [address]     [place], [place]  [address] DOB:     14-Oct-1941 MRN:     [id number]   Dear Dr. Leonette Monarch  Colton Brooks is low cardiac risk for surgery.  He will stop Pradaxa four days prior to surgery. Please call if there are any questions. Our office number is [phone]           Sincerely,  Valera Castle, MD

## 2010-12-20 NOTE — Letter (Signed)
Summary: [facility]  [facility]   Imported By: Marylou Mccoy 08/29/2010 08:25:47  _____________________________________________________________________  External Attachment:    Type:   Image     Comment:   External Document

## 2010-12-20 NOTE — Assessment & Plan Note (Signed)
Summary: 12 months/apc   Primary Provider/Referring Provider:  Harrison Mons  CC:  Follow up  visit-sleep.  History of Present Illness: 04/14/08-OBSTRUCTIVE SLEEP APNEA (ICD-327.23) Hx of ATRIAL FIBRILLATION (ICD-427.31) ASTHMA (ICD-493.90) ALLERGIC RHINITIS (ICD-477.9)   He returns for follow-up saying that he has been relatively stable.  CPAP is set  at 11 CWP through [facility].  His last study was in November of 2007.  He stopped wearing CPAP this winter after a series of colds.  When he got better in the springtime he found that the mask seemed to hurt under his nose.  He does use a Neti pot. asthma is controlled.  04/14/09- OSA, allergic rhinitis, asthma Asks a bout increasing cpap. He wakes unrested more often. Using a full face mask.  Frequent colds this winter- only a little asthma. Got through Spring pollen with continuous use of all meds- reviewed. Controls edema with support hose and diuretics. Currently feels very well.  Apr 13, 2010- OSA, allergic rhinitis, asthma, AF/ Pradoxa No active asthma, except a flare after climbing stairs in [place]- rescue inhaler worked. After that he used rescue inhaler ahead of exertion for rest of trip to be safe. Ambulation limited by his arthritis and DM. He asks I fill celebrex til he can see Dr [name] next month. Spring pollen cause watery rhinorhea- controlled with Claritin then.  Had a sustained flu syndrome last winter- did get flu shot. Sleep apnea- Uses CPAP 12 almost every night. He doesn't take the cpap on trips out of town and we discussed this. Pressure setting now is good.     Preventive Screening-Counseling & Management  Alcohol-Tobacco     Smoking Status: quit > 6 months  Current Medications (verified): 1)  Potassium Chloride Crys Cr 20 Meq Tbcr (Potassium Chloride Crys Cr) .... Take 1 Tablet By Mouth Once A Day 2)  Pradaxa 75 Mg Caps (Dabigatran Etexilate Mesylate) .... Take As Directed 3)  Lipitor 80 Mg  Tabs  (Atorvastatin Calcium) .... Take 1 By Mouth Once Daily 4)  Singulair 10 Mg  Tabs (Montelukast Sodium) .... Take 1 By Mouth Two Times A Day 5)  Coq10 100 Mg  Caps (Coenzyme Q10) .... Take Once Daily 6)  Joint Juice .... Take As Directed 7)  Hydrochlorothiazide 12.5 Mg  Tabs (Hydrochlorothiazide) .... Take 1 By Mouth Once Daily 8)  Accupril 40 Mg  Tabs (Quinapril Hcl) .... Take 1 By Mouth Two Times A Day 9)  Furosemide 80 Mg Tabs (Furosemide) .... Take 1 By Mouth Once Daily 10)  Metformin Hcl 500 Mg Tabs (Metformin Hcl) .... Take 1 Tablet By Mouth Two Times A Day 11)  Actos 30 Mg Tabs (Pioglitazone Hcl) .... Take 1 Tablet By Mouth Once A Day 12)  Victoza 18 Mg/69ml Soln (Liraglutide) .... 1.8 Units Once Daily 13)  Protonix 20 Mg  Tbec (Pantoprazole Sodium) .... Two Times A Day 14)  Advair Diskus 250-50 Mcg/dose  Misc (Fluticasone-Salmeterol) .[name].. 1 Puff Two Times A Day As Needed 15)  Proair Hfa 108 (90 Base) Mcg/act  Aers (Albuterol Sulfate) .... 2 Puffs Four Times A Day As Needed 16)  Cpap 12  [facility] 17)  Skelaxin 800 Mg  Tabs (Metaxalone) .... As Needed 18)  Endocet 7.5-325 Mg  Tabs (Oxycodone-Acetaminophen) .... As Needed 19)  Verelan Pm 300 Mg  Cp24 (Verapamil Hcl) .... Once Daily 20)  Androgel Pump 1 %  Gel (Testosterone) .... Once Daily 21)  Creon 10 33.12-31-35.5 Mu  Cpep (Amylase-Lipase-Protease) .Marland KitchenMarland KitchenMarland Kitchen  Once Daily 22)  Astelin 137 Mcg/spray  Soln (Azelastine Hcl) .... Once Daily 23)  Fluticasone Propionate 50 Mcg/act  Susp (Fluticasone Propionate) .... Once Daily 24)  Vitamin B-12 100 Mcg  Tabs (Cyanocobalamin) .... Once Daily 25)  [place] 300 Mg  Caps ([place]) .... Once Daily 26)  Safflower Oil   Oil (Safflower Oil) .... Once Daily 27)  Accuflora .... Once Daily 28)  Celebrex 200 Mg Caps (Celecoxib) .... Take 1 Tablet By Mouth Once A Day 29)  Metoprolol Succinate 100 Mg Xr24h-Tab (Metoprolol Succinate) .... Take 1 Tablet By Mouth Two Times A Day 30)   Cinnamon/chrouim .... Take 1 By Mouth Once Daily  Allergies (verified): No Known Drug Allergies  Past History:  Past Medical History: Last updated: 04/14/2008 Allergic Rhinitis Asthma Atrial Fibrillation Sleep Apnea Diabetes, Type 2  Past Surgical History: Last updated: 04/14/2009 Knee arthroscopy Tonsillectomy repair lacerated finger Cardiac cath  Family History: Last updated: 04/14/2008 emphysema-father heart disease-mpther,father rheumatism-father cancer-mother-breast mother died at 80 of respiratory failure father died at 65 of respiratory failure sister-71-hypertension brother-64 emphysema  Social History: Last updated: 04/13/2010 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  The patient denies shortness of breath with activity, shortness of breath at rest, productive cough, non-productive cough, coughing up blood, chest pain, acid heartburn, indigestion, loss of appetite, weight change, abdominal pain, difficulty swallowing, sore throat, tooth/dental problems, headaches, nasal congestion/difficulty breathing through nose, and sneezing.    Vital Signs:  Patient profile:   70 year old male Height:      68 inches Weight:      241 pounds BMI:     36.78 O2 Sat:      97 % on Room air Pulse rate:   48 / minute BP sitting:   132 / 64  (left arm) Cuff size:   large  Vitals Entered By: Reynaldo Minium CMA (Apr 13, 2010 9:13 AM)  O2 Flow:  Room air  Physical Exam  Additional Exam:  General: A/Ox3; pleasant and cooperative, NAD,  SKIN: no rash,  lesions NODES: no lymphadenopathy HEENT: [place]/AT, EOM- WNL, Conjuctivae- clear, PERRLA, TM-WNL, Nose- clear, Throat- clear and wnl, Mallampati III-IV NECK: Supple w/ fair ROM, JVD- none, normal carotid impulses w/o bruits Thyroid- , no stridor CHEST: Clear to P&A HEART: RRR- slow but regular, no m/g/r heard ABDOMEN: overweight ZOX:WRUE, nl pulses, no edema -wearing elastic hose, walks with cane NEURO: Grossly intact to observation      Impression & Recommendations:  Problem # 1:  SLEEP APNEA (ICD-780.57)  Good control and compliance with cpap, except when leaving town. We talked about smaller machines available now for travel  Problem # 2:  ALLERGIC RHINITIS (ICD-477.9) Assessment: Improved  Good control with claritin His updated medication list for this problem includes:    Astelin 137 Mcg/spray Soln (Azelastine hcl) ..... Once daily    Fluticasone Propionate 50 Mcg/act Susp (Fluticasone propionate) ..... Once daily  Problem # 3:  ASTHMA (ICD-493.90) Assessment: Improved Good control with minimal need for occasional rescue inhaler  Medications Added to Medication  List This Visit: 1)  Pradaxa 75 Mg Caps (Dabigatran etexilate mesylate) .... Take as directed 2)  Joint Juice  .... Take as directed 3)  Furosemide 80 Mg Tabs (Furosemide) .... Take 1 by mouth once daily 4)  Victoza 18 Mg/83ml Soln (Liraglutide) .... 1.8 units once daily 5)  Cinnamon/chrouim  .... Take 1 by mouth once daily  Other Orders: Est. Patient Level IV (09811) Prescription Created Electronically [id number])  Patient Instructions: 1)  Please schedule a follow-up appointment in 1 year. 2)  Script for Celebrex sent 3)  Copy sent to: Dr [name] Prescriptions: CELEBREX 200 MG CAPS (CELECOXIB) Take 1 tablet by mouth once a day  #30 x 1   Entered and Authorized by:   Waymon Budge MD   Signed by:   Waymon Budge MD on 04/13/2010   Method used:   Electronically to        [id number] (retail)        [address]       [place], [place]  [address]       Ph: [phone] or [phone]       Fax: [phone]   RxID:   [id number]

## 2010-12-20 NOTE — Cardiovascular Report (Signed)
Summary: Pre Cath Orders   Pre Cath Orders   Imported By: Roderic Ovens 08/02/2010 15:14:30  _____________________________________________________________________  External Attachment:    Type:   Image     Comment:   External Document

## 2010-12-20 NOTE — Letter (Signed)
Summary: Cardiac Catheterization Instructions- [facility]  [facility], Main Office  [address]   [place], [place] [address]   Phone: [phone]  Fax: [phone]     07/14/2010 MRN: [id number]  [facility] [address] [place], [place]  [address]  Dear Mr. Kneip,   You are scheduled for a Cardiac Catheterization on _8/31/11 at 9:00 am________ with Dr._nishan_____________  Please arrive to the 1st floor of the Heart and Vascular Center at [facility] at 8:00____ am  on the day of your procedure. Please do not arrive before 6:30 a.m. Call the Heart and Vascular Center at [phone] if you are unable to make your appointmnet. The Code to get into the parking garage under the building is__0002_____. Take the elevators to the 1st floor. You must have someone to drive you home. Someone must be with you for the first 24 hours after you arrive home. Please wear clothes that are easy to get on and off and wear slip-on shoes. Do not eat or drink after midnight except water with your medications that morning. Bring all your medications and current insurance cards with you.  ___ DO NOT take these medications before your procedure: __please stop your pradaxa 07/17/10 and stop your metformin 07/19/10 ______________________________________________________________  _x__ Make sure you take your aspirin.  ___ You may take ALL of your medications with water that morning. ________________________________________________________________________________________________________________________________  ___ DO NOT take ANY medications before your procedure.  ___ Pre-med instructions:  ________________________________________________________________________________________________________________________________  The usual length of stay after your procedure is 2 to 3 hours. This can vary.  If you have any questions, please call the office at the number listed  above.   Ledon Snare, RN

## 2011-01-30 LAB — BASIC METABOLIC PANEL
BUN: 14 mg/dL (ref 6–23)
CO2: 29 mEq/L (ref 19–32)
Chloride: 100 mEq/L (ref 96–112)
Chloride: 100 mEq/L (ref 96–112)
Creatinine, Ser: 1.92 mg/dL — ABNORMAL HIGH (ref 0.4–1.5)
GFR calc Af Amer: 42 mL/min — ABNORMAL LOW (ref >60)
GFR calc non Af Amer: 55 mL/min — ABNORMAL LOW (ref >60)
Glucose, Bld: 157 mg/dL — ABNORMAL HIGH (ref 70–99)
Potassium: 3.5 mEq/L (ref 3.5–5.1)
Sodium: 135 mEq/L (ref 135–145)

## 2011-01-30 LAB — CBC
HCT: 39.6 % (ref 39.0–52.0)
Hemoglobin: 13 g/dL (ref 13.0–17.0)
MCH: 30.1 pg (ref 26.0–34.0)
MCH: 30.3 pg (ref 26.0–34.0)
MCHC: 32.4 g/dL (ref 30.0–36.0)
MCV: 91 fL (ref 78.0–100.0)
MCV: 93.4 fL (ref 78.0–100.0)
MCV: 93.5 fL (ref 78.0–100.0)
Platelets: 162 10*3/uL (ref 150–400)
Platelets: 164 10*3/uL (ref 150–400)
RBC: 4.16 MIL/uL — ABNORMAL LOW (ref 4.22–5.81)
RBC: 4.24 MIL/uL (ref 4.22–5.81)
RDW: 14.9 % (ref 11.5–15.5)
WBC: 11.5 10*3/uL — ABNORMAL HIGH (ref 4.0–10.5)
WBC: 13.5 10*3/uL — ABNORMAL HIGH (ref 4.0–10.5)

## 2011-01-30 LAB — PROTIME-INR
INR: 1.34 (ref 0.00–1.49)
INR: 3.33 — ABNORMAL HIGH (ref 0.00–1.49)

## 2011-01-30 LAB — GLUCOSE, CAPILLARY
Glucose-Capillary: 114 mg/dL — ABNORMAL HIGH (ref 70–99)
Glucose-Capillary: 115 mg/dL — ABNORMAL HIGH (ref 70–99)
Glucose-Capillary: 121 mg/dL — ABNORMAL HIGH (ref 70–99)
Glucose-Capillary: 127 mg/dL — ABNORMAL HIGH (ref 70–99)
Glucose-Capillary: 146 mg/dL — ABNORMAL HIGH (ref 70–99)
Glucose-Capillary: 150 mg/dL — ABNORMAL HIGH (ref 70–99)
Glucose-Capillary: 170 mg/dL — ABNORMAL HIGH (ref 70–99)
Glucose-Capillary: 182 mg/dL — ABNORMAL HIGH (ref 70–99)

## 2011-01-31 LAB — COMPREHENSIVE METABOLIC PANEL
ALT: 23 U/L (ref 0–53)
AST: 26 U/L (ref 0–37)
Albumin: 3.9 g/dL (ref 3.5–5.2)
Calcium: 9.2 mg/dL (ref 8.4–10.5)
GFR calc Af Amer: >60 mL/min (ref >60)
Sodium: 142 mEq/L (ref 135–145)
Total Protein: 7 g/dL (ref 6.0–8.3)

## 2011-01-31 LAB — TYPE AND SCREEN
ABO/RH(D): B POS
Antibody Screen: NEGATIVE

## 2011-01-31 LAB — URINALYSIS, ROUTINE W REFLEX MICROSCOPIC
Ketones, ur: NEGATIVE mg/dL
Nitrite: NEGATIVE
Protein, ur: NEGATIVE mg/dL

## 2011-01-31 LAB — GLUCOSE, CAPILLARY
Glucose-Capillary: 161 mg/dL — ABNORMAL HIGH (ref 70–99)
Glucose-Capillary: 179 mg/dL — ABNORMAL HIGH (ref 70–99)
Glucose-Capillary: 180 mg/dL — ABNORMAL HIGH (ref 70–99)

## 2011-01-31 LAB — CBC
MCHC: 34.5 g/dL (ref 30.0–36.0)
Platelets: 158 10*3/uL (ref 150–400)
RDW: 15.1 % (ref 11.5–15.5)
WBC: 9.7 10*3/uL (ref 4.0–10.5)

## 2011-01-31 LAB — PROTIME-INR
INR: 1.12 (ref 0.00–1.49)
Prothrombin Time: 16.9 seconds — ABNORMAL HIGH (ref 11.6–15.2)

## 2011-01-31 LAB — SURGICAL PCR SCREEN: MRSA, PCR: NEGATIVE

## 2011-03-20 ENCOUNTER — Encounter: Payer: Self-pay | Admitting: Internal Medicine

## 2011-03-22 ENCOUNTER — Encounter: Payer: Self-pay | Admitting: Internal Medicine

## 2011-03-22 ENCOUNTER — Ambulatory Visit (INDEPENDENT_AMBULATORY_CARE_PROVIDER_SITE_OTHER): Payer: Medicare Other | Admitting: Internal Medicine

## 2011-03-22 VITALS — BP 120/72 | HR 50 | Ht 68.0 in | Wt 240.2 lb

## 2011-03-22 DIAGNOSIS — G4730 Sleep apnea, unspecified: Secondary | ICD-10-CM

## 2011-03-22 DIAGNOSIS — J309 Allergic rhinitis, unspecified: Secondary | ICD-10-CM

## 2011-03-22 DIAGNOSIS — J45909 Unspecified asthma, uncomplicated: Secondary | ICD-10-CM

## 2011-03-22 NOTE — Patient Instructions (Signed)
Order- [facility] referral to Dr [name], Orthodontist, for oral appliance for Sleep apnea

## 2011-03-22 NOTE — Assessment & Plan Note (Signed)
His compliance is ok but not better than that. We will give referral to learn about oral appliances.

## 2011-03-22 NOTE — Progress Notes (Signed)
  Subjective:    Patient ID: Colton Brooks, male    DOB: November 26, 1940, 70 y.o.   MRN: [id number]  HPI 8 [name] followed for dyspnea, OSA, allergic rhinitis, asthma, complicated by DM-II, chronic AFib. Last here- September 22, 2010- notre reviewed. No more episodes of dyspnea. Got through right TKR with no respiratory problems.  CPAP remains good, but got off it for a bit while recuperating from knee. If he gets up at 4 AM he won't bother putting it back on. Pressure 12. We talked about alternatives including oral appliances. He is interested in learning more about oral appliances for especially during frequent travel.   Review of Systems See HPI Constitutional:   No weight loss, night sweats,  Fevers, chills, fatigue, lassitude. HEENT:   No headaches,  Difficulty swallowing,  Tooth/dental problems,  Sore throat,                No sneezing, itching, ear ache, nasal congestion, post nasal drip,   CV:  No chest pain,  Orthopnea, PND, swelling in lower extremities, anasarca, dizziness, palpitations  GI  No heartburn, indigestion, abdominal pain, nausea, vomiting, diarrhea, change in bowel habits, loss of appetite  Resp: No shortness of breath with exertion or at rest.  No excess mucus, no productive cough,  No non-productive cough,  No coughing up of blood.  No change in color of mucus.  No wheezing.  Skin: no rash or lesions.  GU: no dysuria, change in color of urine, no urgency or frequency.  No flank pain.  MS:  No joint pain or swelling.  No decreased range of motion.  No back pain.  Psych:  No change in mood or affect. No depression or anxiety.  No memory loss.      Objective:   Physical Exam General- Alert, Oriented, Affect-appropriate, Distress- none acute  Skin- rash-none, lesions- none, excoriation- none  Lymphadenopathy- none  Head- atraumatic  Eyes- Gross vision intact, PERRLA, conjunctivae clear secretions  Ears- Normal- Hearing, canals,   Nose- Clear, No-Septal dev,  mucus, polyps, erosion, perforation   Throat- Mallampati IV , mucosa clear , drainage- none, tonsils- atrophic  Neck- flexible , trachea midline, no stridor , thyroid nl, carotid no bruit  Chest - symmetrical excursion , unlabored     Heart/CV- Irregular/ controlled , no murmur , no gallop  , no rub, nl s1 s2                     - JVD- none , edema- none, stasis changes- none, varices- none     Lung- clear to P&A,    wheeze- none, cough- none , dullness-none, rub- none     Chest wall-  Abd- tender-no, distended-no, bowel sounds-present, HSM- no  Br/ Gen/ Rectal- Not done, not indicated  Extrem- cyanosis- none, clubbing, none, atrophy- none, strength- nl. New right knee scar, cane.   Neuro- grossly intact to observation         Assessment & Plan:

## 2011-03-26 NOTE — Assessment & Plan Note (Signed)
Infrequent need for rescue albuterol inhaler. Better stability in the past year.

## 2011-03-26 NOTE — Assessment & Plan Note (Signed)
This Spring season has been quite benign, with occasional antihistamine when needed.

## 2011-04-04 NOTE — Assessment & Plan Note (Signed)
[facility]                             PULMONARY OFFICE NOTE   Colton Brooks, Colton Brooks                    MRN:          [id number]  DATE:04/16/2007                            DOB:          July 16, 1941    PROBLEM:  1. Obstructive sleep apnea.  2. Allergic rhinitis.  3. Chronic atrial fibrillation/Coumadin.  4. Asthma.   HISTORY:  We had changed his CPAP pressure to 11 and home titration had  shown an AHI on this pressure of 2.7.  He feels very comfortable with  this.  He continues Singulair which he insists he needs to take b.i.d.,  that once a day is not enough.  I told him that the standard  prescription guidance for this drug was once daily.  He is tolerating it  very well and believes it helps his breathing.  Otherwise, he has had no  problems.   MEDICATIONS:  1. Lipitor 80 mg.  2. Singulair 10 mg b.i.d.  3. Coumadin.  4. Potassium.  5. Hydrochlorothiazide.  6. Toprol XL 100 mg.  7. Accupril 40 mg b.i.d.  8. Furosemide 20 mg.  9. Glucophage XR 1000 mg.  10.Actos 45 mg.  11.AndroGel.  12.Byetta.  13.Protonix.  14.Albuterol rescue inhaler.  15.Advair 250/50.  16.CPAP at 11 CWP through [facility].   No medication allergy.   OBJECTIVE:  Weight 252 pounds, blood pressure 136/78, pulse is about 70,  room air saturation 96%.  He is overweight, walks with a cane, but in very good spirits.  Breath sounds are distant, but unlabored with no rales, rhonchi or  wheeze heard.  Heart sounds were regular or nearly so without murmur audible.  No edema.   IMPRESSION:  Overall doing quite well.   PLAN:  Continue CPAP at 11 CWP, continue pulmonary medications and  schedule return in one year, earlier p.r.n.     Clinton D. Maple Hudson, MD, Tonny Bollman, FACP  Electronically Signed    CDY/MedQ  DD: 04/16/2007  DT: 04/17/2007  Job #: [id number]

## 2011-04-07 NOTE — Assessment & Plan Note (Signed)
[facility]                               PULMONARY OFFICE NOTE   Colton Brooks, Colton Brooks                    MRN:          [id number]  DATE:09/01/2006                            DOB:          Dec 24, 1940    PROBLEM:  1. Obstructive sleep apnea.  2. Allergic rhinitis.  3. Chronic atrial fibrillation/Coumadin.  4. Asthma.   HISTORY:  Singulair has helped his nasal congestion, and Advair has seemed  to help his chest.  He has finally came back saying that his CPAP machine is  old and worn out.  We had been unclear about his pressure status previously,  and he is at a point now here his insurance needs a new study to approve a  replacement machine.  He has gotten his flu shot.  He is being cared for by  Dr. __________  for his atrial fibrillation and control of peripheral edema.   OBJECTIVE:  VITAL SIGNS:  He has gained a few more pounds since I last saw  him in May 2006, now at 263 pounds.  BP 132/74.  Pulse regular at 72.  Room  air saturation 97%.  His pulse feels regular to me with occasional extra  beat.  GENERAL:  He seems alert, relaxed and comfortable, walking with a cane.  RESPIRATORY:  Breathing is unlabored and lung fields are clear.  His nasal  airway is not obstructed.  NECK:  There is no neck vein distention.  EXTREMITIES:  No ankle edema.   MEDICATIONS:  1. Lipitor 80 mg.  2. Singulair 10 mg b.i.d.  3. Fish oil.  4. Glucosamine.  5. Verelan 300 mg.  6. Coumadin.  7. Potassium 20 mEq.  8. HCTZ 12.5 mg.  9. Toprol XL 100.  10.Accupril 40 mg b.i.d.  11.Furosemide 20 mg.  12.Glucophage XR 1,000 mg.  13.Actos 45 mg.  14.AndroGel.  15.Byetta.  16.Protonix.  17.Albuterol inhaler.  18.Advair 250/50.   ALLERGIES:  NO MEDICATION ALLERGY.   IMPRESSION:  1. Obstructive sleep apnea, needing replacement CPAP and reassessment of      correct pressures.  2. Exogenous obesity.  Weight gain would help several of his medical  problems.  3. Asthma.  Has been adequately controlled.  4. Atrial fibrillation.  Now feeling a if he may be in sinus rhythm with      PAC's, on Coumadin followed by Dr. __________ .   PLAN:  1. We refilled his Albuterol inhaler with discussion of effect on heart      rhythm.  2. Reschedule nocturnal polysomnogram by split protocol with the [facility].  3. He will return after his sleep study for my followup.     Clinton D. Maple Hudson, MD, Tonny Bollman, FACP  Electronically Signed    CDY/MedQ  DD: 09/20/2006  DT: 09/21/2006  Job #: [id number]   cc:   [facility]

## 2011-04-07 NOTE — Assessment & Plan Note (Signed)
[facility]   NAME:  [name], [name]             ACCOUNT NO.:  [id number]   MEDICAL RECORD NO.:  [id number]      DATE OF BIRTH:  10-Feb-1941   PHYSICIAN:  Theresia Majors. Tanda Rockers, M.D. VISIT DATE:  11/07/2006                                   OFFICE VISIT   VITAL SIGNS:  Blood pressure is 125/80, respirations 18, pulse rate 78  and temperature 98.4.  Capillary blood glucose is 113 mg percent.   PURPOSE OF TODAY'S VISIT:  Mr. Colton Brooks is a 70 year old man who we have  treated for a stasis ulcer involving his right lower extremity.  In the  interim, he has worn an Radio broadcast assistant.  He returns complaining of decrease  in drainage, no excessive malodor or pain.   WOUND EXAM:  Inspection of the right lower extremity shows that the  ulcer has markedly improved.  There is 80-85% reepithelization with  healthy granulation tissue over the remainder of the wound.  There is no  evidence of infection.  The pulses remain palpable.  There is bilateral  2+ edema with the persistent changes of chronic stasis.   DIAGNOSIS:  Marked improvement.   MANAGEMENT PLAN & GOAL:  We will reapply an Unna boot and we have given  the patient a prescription for bilateral, below the knee, compression  hose to be fitted in the interim.  We will reevaluate the patient in 1  week.  The patient reports that he is going out of town through the  holidays and will not be back until after the new year.  We have  explained to him that we prefer to see the patient back in 1 week.  Nevertheless, if he intends to leave the area, the Unna boot should not  stay on over 10 days, at which time he can remove that, wash the leg and  to begin wearing the compression hose.  We will see him when he returns  from his vacation.           ______________________________  Theresia Majors Tanda Rockers, M.D.     Cephus Slater  D:  11/07/2006  T:  11/07/2006  Job:  [id number]

## 2011-04-07 NOTE — Assessment & Plan Note (Signed)
[facility]                             PULMONARY OFFICE NOTE   Colton Brooks, Colton Brooks                    MRN:          [id number]  DATE:10/16/2006                            DOB:          Aug 29, 1941    PROBLEM LIST:  1. Obstructive sleep apnea.  2. Allergic rhinitis.  3. Chronic atrial fibrillation/Coumadin.  4. Asthma.   This is a late dictation for record completion. The patient had reported  breathing well. Humidifier was uncomfortable on his old CPAP machine  with pressure set at 11. He had an updated sleep study on September 20, 2006 confirming moderately severe obstructive apnea with an index of  53.8/hour, moderate to loud snoring and oxygen desaturation to 80%. CPAP  titration was to 13 CWP for an index of zero.   MEDICATIONS:  1. Lipitor 80 mg.  2. Singulair 10 mg.  3. Verelan 300 mg.  4. Coumadin.  5. Potassium 20 mEq.  6. HCTZ 12.5 mg.  7. Toprol XL 100 mg.  8. Accupril 40 mg.  9. Furosemide 20 mg.  10.Glucophage XR 1000 mg.  11.Actos 45 mg.  12.Byetta.  13.Protonix.  14.Albuterol rescue inhaler p.r.n. use.  15.Advair 250/50.  16.CPAP.   MEDICATION ALLERGIES:  None.   OBJECTIVE:  Weight 253 pounds, blood pressure 142/82, pulse 67, room air  saturation 97%. Overweight, alert, pulse irregularly irregular.  Breathing unlabored. Nasal airway unobstructed.   IMPRESSION:  1. Obstructive sleep apnea.  2. Asthma is adequately controlled.  3. Rhinitis is controlled.   PLAN:  We emphasized weight loss. After discussion, we have refilled  Singulair which he likes to be able to use b.i.d. He finds that it does  help on standard dose. We are going to change the CPAP to 13 CWP and  schedule return in 6 months, earlier p.r.n.     Clinton D. Maple Hudson, MD, Tonny Bollman, FACP  Electronically Signed    CDY/MedQ  DD: 01/29/2007  DT: 01/31/2007  Job #: [id number]

## 2011-04-07 NOTE — Procedures (Signed)
NAME:  Colton Brooks, Colton Brooks NO.:  [id number]   MEDICAL RECORD NO.:  [id number]          PATIENT TYPE:  OUT   LOCATION:  SLEEP CENTER                 FACILITY:  [facility]   PHYSICIAN:  Clinton D. Maple Hudson, MD, FCCP, FACPDATE OF BIRTH:  10[birth year]-39   DATE OF STUDY:  09/20/2006                              NOCTURNAL POLYSOMNOGRAM   INDICATION FOR STUDY:  Hypersomnia with sleep apnea.   EPWORTH SLEEPINESS SCORE:  10/24.  BMI 37.6, weight 263 pounds.   MEDICATIONS:  Listed and reviewed.   The patient has a history of sleep apnea and CPAP mask at 11 cwp which he  has found uncomfortable. He is being reevaluated anticipating replacement  machine if appropriate.   SLEEP ARCHITECTURE:  Total sleep time 403 minutes with sleep efficiency at  87%.  Stage I was 10%;  stage II 76%; stages III and IV 2%.  REM 13% of  total sleep time.  Sleep latency 10 minutes, REM latency 166 minutes.  Awake  after sleep onset 51 minutes.  Arousal index 7.  No bedtime medication was  reported.   RESPIRATORY DATA:  Split study protocol.  Apnea hypopnea index (AHI, RDI)  53.8 obstructive events per hour indicating moderately severe obstructive  sleep apnea/hypopnea syndrome before CPAP.  There were 34 obstructive  apneas, and 135 hypopneas before CPAP.  He slept only supine.  All events  were recorded in that position.  REM AHI 27.3 per hour.  CPAP was titrate to  13 cwp, AHI 0 per hour.  Events were suppressed at all pressures tested, and  snoring was finally suppressed at 13 cwp.  He has a full-face beard.  A  Respironics ComfortLite mask small, nasal pillows was used with the heated  humidifier.   OXYGEN DATA:  Moderate-to-loud snoring before CPAP with oxygen desaturation  to a nadir of 80%.  After CPAP control saturation held 95% on room air.   CARDIAC DATA:  There is a regular narrow complex rhythm.  The patient has a  history of atrial fibrillation.  The rate for this study ranges between 61  and 69 beats/minute.  Through much of the tracing there is a poorly definite  P wave with short and possibly variable PR interval difficult to define on a  single lead tracing.  This may be a well regulated atrial fibrillation or  junctional rhythm. A 12-lead EKG would be necessary for clarification.   MOVEMENT/PARASOMNIA:  Occasional limb jerk with little effect on sleep.   IMPRESSIONS/RECOMMENDATIONS:  1. Moderately severe obstructive sleep apnea/hypopnea syndrome.  AHI 53.8      per hour while sleeping only supine.  Moderate-to-loud snoring with      oxygen saturation to a nadir of 80%.  2. CPAP titration to 13 cwp, AHI 0 per hour.  A Respironics ComfortLite      mask  with small nasal pillows and      heated humidifier was used.  3. A poorly definite cardiac rhythm, possibly atrial fibrillation as per      patient history.      Clinton D. Maple Hudson, MD, FCCP, FACP  Diplomate, Biomedical engineer of Sleep Medicine  Electronically  Signed     CDY/MEDQ  D:  09/23/2006 11:01:56  T:  09/24/2006 09:58:38  Job:  [id number]

## 2011-04-07 NOTE — Assessment & Plan Note (Signed)
[facility]   NAME:  ANDREJ, SPAGNOLI             ACCOUNT NO.:  [id number]   MEDICAL RECORD NO.:  [id number]      DATE OF BIRTH:  05-03-1941   PHYSICIAN:  Maxwell Caul, M.D. VISIT DATE:  11/23/2006                                   OFFICE VISIT   PURPOSE OF TODAY'S VISIT:  Followup venous stasis wound on his right  anterior leg.   Mr. Orlick was last seen here on December 19.  He had an Radio broadcast assistant  applied for a stasis ulcer involving his right lower extremity.  He  actually took the [employer] off sometime after New Years.  He does not  complain of any drainage, excessive malodor or pain.   WOUND EXAM:  The wound is essentially resolved.  His edema appears well  controlled.  He has very extensive changes of venous stasis.  His  peripheral pulses are well palpable.  There is 1+ edema noted.   DIAGNOSIS:  Resolved wound.   MANAGEMENT PLAN & GOAL:  We applied his own graded pressure stockings,  which we had ordered for him.  They appear to fit well.  The area of  original wound is somewhat erythematous and I think is all part of his  venous stasis looking back on the pictures.  There is warmth in the  area, but no specific evidence of infection.  It is not tender or  painful.   I am going to see him back in 2 weeks using his own graded pressure  stockings just to followup on this wound.  Incidentally, he had  requested that we order him another pair.           ______________________________  Maxwell Caul, M.D.     MGR/MEDQ  D:  11/23/2006  T:  11/23/2006  Job:  [id number]

## 2011-04-07 NOTE — Consult Note (Signed)
Colton Brooks, Colton Brooks NO.:  [id number]   MEDICAL RECORD NO.:  [id number]          PATIENT TYPE:  REC   LOCATION:  FOOT                         FACILITY:  [facility]   PHYSICIAN:  Theresia Majors. Tanda Rockers, M.D.DATE OF BIRTH:  18-Sep-1941   DATE OF CONSULTATION:  10/31/2006  DATE OF DISCHARGE:                                 CONSULTATION   SUBJECTIVE:  Colton Brooks is a 70 year old man who is referred by Dr.  Clelia Brooks for evaluation of an ulceration on the anteromedial aspect of the  right lower extremity.   IMPRESSION:  Post traumatic stasis ulcer with fluid retention.   RECOMMENDATIONS:  Proceed with __________ protocol following complete  debridement in the wound center.   SUBJECTIVE:  Colton Brooks is a 70 year old man who has been under the  care of Dr. Clelia Brooks for diabetes and asthma.  He had a blunt injury to the  right lower extremity approximately a week ago followed by blister  formation, rupture and ulceration.  He has not had fever but the wound  has been moderately painful.   PAST MEDICAL HISTORY:  Remarkable for NO KNOWN ALLERGIES.   CURRENT MEDICATIONS:  1. Singulair 10 mg daily.  2. Advair 250/50 mg p.r.n.  3. Fluticasone propionate 50 mg b.i.d.  4. Astelin HCl 137 mg daily.  5. Hydrochlorothiazide 12.5 mg daily.  6. Warfarin 5 mg on Wednesday.  7. Verelan 300 p.m.  8. Toprol 50 mg daily.  9. Toprol XL 100 mg daily  10.K-Dur 20 mEq quadrant.  11.Furosemide 20 mg daily.  12.Accupril 40 mg daily.  13.Lipitor 80 mg daily.  14.Metformin extended release 500 mg.  15.Actos 30 mg daily.  16.Byetta 10 mg b.i.d.  17.Protonix 40 mg daily.  18.AndroGel 1% 5 g.  19.Mobic 15 mg daily.  20.Skelaxin 800 mg p.r.n.  21.Co-enzyme Q10, triple Omar omega 1 in the morning.  22.Magnesium 400 mg daily.  23.Horse Chestnut 250 mg p.r.n.  24.Accuflora150 mg p.r.n.  25.Niacin 500 mg daily.  26.__________ 20 mg p.r.n.  27.Cinnamon and glucosamine chondroitin 1500 mg daily.   PAST SURGICAL HISTORY:  Included bilateral knee arthroscopies.   FAMILY HISTORY:  Positive for diabetes, vascular disease.  Negative for  cancer.   SOCIAL HISTORY:  The patient is retired, lives in [place].   REVIEW OF SYSTEMS:  Remarkable for the absence of claudication.  He  denies transient visual field losses or paralysis.  His weight has  recently decreased concurrent of the administration of his new  hypoglycemic agents.  He denies heat or cold intolerance, polydipsia or  polyuria.  There are no bowel or bladder complaints.  The remainder of  the review of systems is negative.   PHYSICAL EXAMINATION:  GENERAL:  He is a pleasant oriented male in no  acute distress.  VITAL SIGNS:  His blood pressure is 140/80, respirations 20, pulse rate  78 and he is afebrile.  Capillary blood glucose is 109 mg%.  HEENT:  Clear.  NECK:  Supple.  Trachea midline.  Thyroid is nonpalpable.  LUNGS:  Clear.  HEART:  Sounds are normal.  ABDOMEN:  Soft.  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  There is no popliteal  tenderness.  The dorsalis pedis pulse is 3+, readily palpable  bilaterally.  There are no obvious varicosities.  There is no local  warmth.  On the anterolateral medial aspect of the right lower  extremity, there is a denuded area consistent with ruptured stasis  blister.  It is markedly tender but was completely relieved with the  application of EMLA cream.  NEUROLOGIC:  Protective sensation is preserved.  A full-thickness  debridement of the ulceration was performed without difficulty.  Thereafter an  wrap was placed.   DISCUSSION:  The patient has severe fluid retention and stasis.  This  will aggravate his ulceration.  We have explained the protocol of  compression wraps to the patient in terms that he seems to understand.  We will see him weekly for followup and for the wrap changes and   debridements as necessary.  We have also provided him with a  prescription for Vicodin, total of 20 tablets.  We will re-evaluate him  in a week.  The patient has been given opportunity to ask questions.  He  seems to be satisfied and expresses gratitude of having been seen in the  clinic.           ______________________________  Theresia Majors. Tanda Rockers, M.D.     Cephus Slater  D:  10/31/2006  T:  11/01/2006  Job:  [id number]   cc:   Colton Croft, MD

## 2011-04-20 ENCOUNTER — Encounter: Payer: Self-pay | Admitting: Internal Medicine

## 2011-09-27 ENCOUNTER — Encounter: Payer: Self-pay | Admitting: Internal Medicine

## 2011-09-27 ENCOUNTER — Ambulatory Visit (INDEPENDENT_AMBULATORY_CARE_PROVIDER_SITE_OTHER): Payer: Medicare Other | Admitting: Internal Medicine

## 2011-09-27 VITALS — BP 136/78 | HR 55 | Ht 68.0 in | Wt 247.6 lb

## 2011-09-27 DIAGNOSIS — G4733 Obstructive sleep apnea (adult) (pediatric): Secondary | ICD-10-CM

## 2011-09-27 DIAGNOSIS — G4730 Sleep apnea, unspecified: Secondary | ICD-10-CM

## 2011-09-27 DIAGNOSIS — J309 Allergic rhinitis, unspecified: Secondary | ICD-10-CM

## 2011-09-27 NOTE — Patient Instructions (Signed)
Order- [facility]- change CPAP to 12 cwp  Dx OSA  Continue present treatment- please call as needed

## 2011-09-27 NOTE — Progress Notes (Signed)
Subjective:    Patient ID: Colton Brooks, male    DOB: 13-Jun-1941, 70 y.o.   MRN: [id number]  03/22/11- HPI 70 [name] followed for dyspnea, OSA, allergic rhinitis, asthma, complicated by DM-II, chronic AFib. Last here- September 22, 2010- note reviewed. No more episodes of dyspnea. Got through right TKR with no respiratory problems.  CPAP remains good, but got off it for a bit while recuperating from knee. If he gets up at 4 AM he won't bother putting it back on. Pressure 12. We talked about alternatives including oral appliances. He is interested in learning more about oral appliances for especially during frequent travel.   09/27/11- 70 [name] followed for dyspnea, OSA, allergic rhinitis, asthma, complicated by DM-II, chronic AFib. He has had flu vaccine. He did go to Dr. [name] and was fitted for an oral appliance (TAP) which she uses an instead of CPAP when traveling because it is more portable. He sleeps better with CPAP. If his breathing is bad because of a cold for asthma, he can't wear CPAP because he can't cough into the mask. He has noted more nocturia with his oral appliance and I discussed that as suggesting his blood pressure goes higher during the night when he is wearing it, leading to renal compensation. Now for 4-5 weeks he has had sinus congestion and otitis with hearing loss. He was evaluated by Dr. [name] and given antibiotic by Dr. [name]. He stays on Claritin, nasal spray and daily saline nasal rinse. Because of his atrial fibrillation he avoids Sudafed. He tolerates Advair twice a day for his asthma and uses a rescue inhaler about twice daily. Arthritis limits walking more than his breathing does, especially in cold weather. CPAP is usually comfortable. He asks for a marginal increase from 11.6-12 CWP. Compliance and control are good.   Review of Systems See HPI Constitutional:   No-   weight loss, night sweats, fevers, chills, fatigue, lassitude. HEENT:   No-  headaches, difficulty  swallowing, tooth/dental problems, sore throat,       No-  sneezing, itching, ear ache, +Occasional-nasal congestion, post nasal drip,  CV:  No-   chest pain, orthopnea, PND, swelling in lower extremities, anasarca,                                  dizziness, palpitations Resp: Some shortness of breath with exertion.              No-   productive cough,  No non-productive cough,  No- coughing up of blood.              No-   change in color of mucus.  No- wheezing.   Skin: No-   rash or lesions. GI:  No-   heartburn, indigestion, abdominal pain, nausea, vomiting, diarrhea,                 change in bowel habits, loss of appetite GU: No-   dysuria, change in color of urine, no urgency or frequency.  No- flank pain. MS:  Chronic  joint pain or swelling.  No- decreased range of motion.  + back pain. Neuro-     nothing unusual Psych:  No- change in mood or affect. No depression or anxiety.  No memory loss.      Objective:   Physical Exam General- Alert, Oriented, Affect-appropriate, Distress- none acute; obese Skin- rash-none, lesions- none, excoriation- none Lymphadenopathy- none  Head- atraumatic            Eyes- Gross vision intact, PERRLA, conjunctivae clear secretions            Ears- Hearing, canals-normal            Nose- Clear, no-Septal dev, mucus, polyps, erosion, perforation             Throat- Mallampati III , mucosa clear , drainage- none, tonsils- atrophic Neck- flexible , trachea midline, no stridor , thyroid nl, carotid no bruit Chest - symmetrical excursion , unlabored           Heart/CV- Irregular , no murmur , no gallop  , no rub, nl s1 s2                           - JVD- none , edema- none, stasis changes- none, varices- none           Lung- clear to P&A, wheeze- none, cough- none , dullness-none, rub- none           Chest wall-  Abd- tender-no, distended-no, bowel sounds-present, HSM- no Br/ Gen/ Rectal- Not done, not indicated Extrem- cyanosis- none, clubbing, none,  atrophy- none, strength- nl Neuro- grossly intact to observation

## 2011-09-30 NOTE — Assessment & Plan Note (Signed)
Good compliance and control with CPAP. Oral appliance from Dr. [name] has been an acceptable substitute with greater portability for trips.

## 2011-09-30 NOTE — Assessment & Plan Note (Signed)
Over the last month he has had a persistent rhinosinusitis with eustachian tube dysfunction and otitis. This has been evaluated by his primary physician and ENT. Unfortunately he does not tolerate decongestants well because of his heart rhythm. Plan-continue saline rinse.

## 2011-12-06 DIAGNOSIS — E1129 Type 2 diabetes mellitus with other diabetic kidney complication: Secondary | ICD-10-CM | POA: Diagnosis not present

## 2011-12-06 DIAGNOSIS — E785 Hyperlipidemia, unspecified: Secondary | ICD-10-CM | POA: Diagnosis not present

## 2011-12-06 DIAGNOSIS — J45909 Unspecified asthma, uncomplicated: Secondary | ICD-10-CM | POA: Diagnosis not present

## 2011-12-06 DIAGNOSIS — I10 Essential (primary) hypertension: Secondary | ICD-10-CM | POA: Diagnosis not present

## 2012-03-12 DIAGNOSIS — J00 Acute nasopharyngitis [common cold]: Secondary | ICD-10-CM | POA: Diagnosis not present

## 2012-03-26 ENCOUNTER — Encounter: Payer: Self-pay | Admitting: Internal Medicine

## 2012-03-26 ENCOUNTER — Ambulatory Visit (INDEPENDENT_AMBULATORY_CARE_PROVIDER_SITE_OTHER): Payer: Medicare Other | Admitting: Internal Medicine

## 2012-03-26 ENCOUNTER — Ambulatory Visit (INDEPENDENT_AMBULATORY_CARE_PROVIDER_SITE_OTHER)
Admission: RE | Admit: 2012-03-26 | Discharge: 2012-03-26 | Disposition: A | Discharge status: Home / Self Care | Payer: Medicare Other | Source: Ambulatory Visit | Attending: Internal Medicine | Admitting: Internal Medicine

## 2012-03-26 VITALS — BP 138/80 | HR 64 | Ht 68.0 in | Wt 251.0 lb

## 2012-03-26 DIAGNOSIS — J209 Acute bronchitis, unspecified: Secondary | ICD-10-CM

## 2012-03-26 DIAGNOSIS — J40 Bronchitis, not specified as acute or chronic: Secondary | ICD-10-CM | POA: Diagnosis not present

## 2012-03-26 DIAGNOSIS — J45909 Unspecified asthma, uncomplicated: Secondary | ICD-10-CM

## 2012-03-26 DIAGNOSIS — J309 Allergic rhinitis, unspecified: Secondary | ICD-10-CM

## 2012-03-26 DIAGNOSIS — J45998 Other asthma: Secondary | ICD-10-CM

## 2012-03-26 DIAGNOSIS — G4730 Sleep apnea, unspecified: Secondary | ICD-10-CM | POA: Diagnosis not present

## 2012-03-26 DIAGNOSIS — J3089 Other allergic rhinitis: Secondary | ICD-10-CM

## 2012-03-26 NOTE — Patient Instructions (Signed)
Order CXR- dx acute bronchitis  Order- DME- [facility]    Replacement CPAP machine, 12 cwp, humidifier, supplies   Dx OSA

## 2012-03-26 NOTE — Progress Notes (Signed)
Subjective:    Patient ID: Colton Brooks, male    DOB: 08-20-1941, 71 y.o.   MRN: [id number]  03/22/11- HPI 69 [name] followed for dyspnea, OSA, allergic rhinitis, asthma, complicated by DM-II, chronic AFib. Last here- September 22, 2010- note reviewed. No more episodes of dyspnea. Got through right TKR with no respiratory problems.  CPAP remains good, but got off it for a bit while recuperating from knee. If he gets up at 4 AM he won't bother putting it back on. Pressure 12. We talked about alternatives including oral appliances. He is interested in learning more about oral appliances for especially during frequent travel.   09/27/11- 69 [name] followed for dyspnea, OSA, allergic rhinitis, asthma, complicated by DM-II, chronic AFib. He has had flu vaccine. He did go to Dr. [name] and was fitted for an oral appliance (TAP) which she uses an instead of CPAP when traveling because it is more portable. He sleeps better with CPAP. If his breathing is bad because of a cold for asthma, he can't wear CPAP because he can't cough into the mask. He has noted more nocturia with his oral appliance and I discussed that as suggesting his blood pressure goes higher during the night when he is wearing it, leading to renal compensation. Now for 4-5 weeks he has had sinus congestion and otitis with hearing loss. He was evaluated by Dr. [name] and given antibiotic by Dr. [name]. He stays on Claritin, nasal spray and daily saline nasal rinse. Because of his atrial fibrillation he avoids Sudafed. He tolerates Advair twice a day for his asthma and uses a rescue inhaler about twice daily. Arthritis limits walking more than his breathing does, especially in cold weather. CPAP is usually comfortable. He asks for a marginal increase from 11.6-12 CWP. Compliance and control are good.  03/26/12- 69 [name] followed for dyspnea, OSA, allergic rhinitis, asthma, complicated by DM-II, chronic AFib. PCP Dr [name] Wears CPAP every night when  home; otherwise uses oral appliance from Dr [name] when traveling; Recently had sinus infection and cold. CPAP machine/12/[facility] is an old machine. He had sinusitis with some blood in nasal discharge treated by Dr. [name] successfully with antibiotic ? Augmentin.  Review of Systems-See HPI Constitutional:   No-   weight loss, night sweats, fevers, chills, fatigue, lassitude. HEENT:   No-  headaches, difficulty swallowing, tooth/dental problems, sore throat,       No-  sneezing, itching, ear ache, +Occasional-nasal congestion, post nasal drip,  CV:  No-   chest pain, orthopnea, PND, swelling in lower extremities, anasarca, dizziness, palpitations Resp: Some shortness of breath with exertion.              No-   productive cough,  No non-productive cough,  No- coughing up of blood.              No-   change in color of mucus.  No- wheezing.   Skin: No-   rash or lesions. GI:  No-   heartburn, indigestion, abdominal pain, nausea, vomiting,  GU: No-   dysuria,  MS:  +Chronic joint pain or swelling.   Neuro-     nothing unusual Psych:  No- change in mood or affect. No depression or anxiety.  No memory loss.   Objective:   Physical Exam General- Alert, Oriented, Affect-appropriate, Distress- none acute; obese Skin- rash-none, lesions- none, excoriation- none Lymphadenopathy- none Head- atraumatic            Eyes-  Gross vision intact, PERRLA, conjunctivae clear secretions            Ears- Hearing, canals-normal            Nose- Clear, no-Septal dev, mucus, polyps, erosion, perforation             Throat- Mallampati III-IV , mucosa clear , drainage- none, tonsils- atrophic Neck- flexible , trachea midline, no stridor , thyroid nl, carotid no bruit Chest - symmetrical excursion , unlabored           Heart/CV- Irregular , no murmur , no gallop  , no rub, nl s1 s2                           - JVD- none , edema- none, stasis changes- none, varices- none           Lung- clear to  P&A except for a few crackles, wheeze- none, cough- none , dullness-none, rub- none           Chest wall-  Abd-  Br/ Gen/ Rectal- Not done, not indicated Extrem- cyanosis- none, clubbing, none, atrophy- none, strength- nl, cane Neuro- grossly intact to observation

## 2012-03-29 NOTE — Progress Notes (Signed)
Quick Note:  Pt aware of results. ______ 

## 2012-03-31 NOTE — Assessment & Plan Note (Signed)
Not sure if this explains faint crackles that I hear on exam. Plan-chest x-ray

## 2012-03-31 NOTE — Assessment & Plan Note (Signed)
Recent sinusitis may have been related to spring pollens. He now seems to be in good control and resolved.

## 2012-03-31 NOTE — Assessment & Plan Note (Signed)
Good compliance and control. His machine is quite old and we have discussed replacement before it breaks

## 2012-04-09 DIAGNOSIS — E785 Hyperlipidemia, unspecified: Secondary | ICD-10-CM | POA: Diagnosis not present

## 2012-04-09 DIAGNOSIS — I10 Essential (primary) hypertension: Secondary | ICD-10-CM | POA: Diagnosis not present

## 2012-04-09 DIAGNOSIS — E1129 Type 2 diabetes mellitus with other diabetic kidney complication: Secondary | ICD-10-CM | POA: Diagnosis not present

## 2012-04-10 DIAGNOSIS — H2510 Age-related nuclear cataract, unspecified eye: Secondary | ICD-10-CM | POA: Diagnosis not present

## 2012-04-10 DIAGNOSIS — H35379 Puckering of macula, unspecified eye: Secondary | ICD-10-CM | POA: Diagnosis not present

## 2012-04-10 DIAGNOSIS — H43819 Vitreous degeneration, unspecified eye: Secondary | ICD-10-CM | POA: Diagnosis not present

## 2012-04-10 DIAGNOSIS — H40019 Open angle with borderline findings, low risk, unspecified eye: Secondary | ICD-10-CM | POA: Diagnosis not present

## 2012-08-22 DIAGNOSIS — E039 Hypothyroidism, unspecified: Secondary | ICD-10-CM | POA: Diagnosis not present

## 2012-08-22 DIAGNOSIS — I10 Essential (primary) hypertension: Secondary | ICD-10-CM | POA: Diagnosis not present

## 2012-08-22 DIAGNOSIS — R82998 Other abnormal findings in urine: Secondary | ICD-10-CM | POA: Diagnosis not present

## 2012-08-22 DIAGNOSIS — Z125 Encounter for screening for malignant neoplasm of prostate: Secondary | ICD-10-CM | POA: Diagnosis not present

## 2012-08-22 DIAGNOSIS — E291 Testicular hypofunction: Secondary | ICD-10-CM | POA: Diagnosis not present

## 2012-08-22 DIAGNOSIS — E785 Hyperlipidemia, unspecified: Secondary | ICD-10-CM | POA: Diagnosis not present

## 2012-08-22 DIAGNOSIS — E1129 Type 2 diabetes mellitus with other diabetic kidney complication: Secondary | ICD-10-CM | POA: Diagnosis not present

## 2012-08-29 DIAGNOSIS — Z125 Encounter for screening for malignant neoplasm of prostate: Secondary | ICD-10-CM | POA: Diagnosis not present

## 2012-08-29 DIAGNOSIS — E1129 Type 2 diabetes mellitus with other diabetic kidney complication: Secondary | ICD-10-CM | POA: Diagnosis not present

## 2012-08-29 DIAGNOSIS — Z0000 Encounter for general adult medical examination without abnormal findings: Secondary | ICD-10-CM | POA: Diagnosis not present

## 2012-08-29 DIAGNOSIS — I10 Essential (primary) hypertension: Secondary | ICD-10-CM | POA: Diagnosis not present

## 2012-08-29 DIAGNOSIS — Z23 Encounter for immunization: Secondary | ICD-10-CM | POA: Diagnosis not present

## 2012-09-02 DIAGNOSIS — Z23 Encounter for immunization: Secondary | ICD-10-CM | POA: Diagnosis not present

## 2012-09-18 ENCOUNTER — Ambulatory Visit (INDEPENDENT_AMBULATORY_CARE_PROVIDER_SITE_OTHER): Payer: Medicare Other | Admitting: Cardiology

## 2012-09-18 ENCOUNTER — Encounter: Payer: Self-pay | Admitting: Cardiology

## 2012-09-18 VITALS — BP 142/74 | HR 81 | Ht 69.0 in | Wt 240.0 lb

## 2012-09-18 DIAGNOSIS — I4891 Unspecified atrial fibrillation: Secondary | ICD-10-CM

## 2012-09-18 NOTE — Patient Instructions (Signed)
Your physician wants you to follow-up in: 1 year with Dr. Wall. You will receive a reminder letter in the mail two months in advance. If you don't receive a letter, please call our office to schedule the follow-up appointment.  Your physician recommends that you continue on your current medications as directed. Please refer to the Current Medication list given to you today.  

## 2012-09-18 NOTE — Progress Notes (Signed)
HPI Colton Brooks returns today for evaluation and management of his chronic A. fib and anticoagulation. He is totally asymptomatic. He denies any presyncope, syncope, palpitations or chest pain. He's had no bleeding.  Past Medical History  Diagnosis Date  . Allergic rhinitis, cause unspecified   . Asthma   . AF (atrial fibrillation)   . OSA on CPAP   . Type II or unspecified type diabetes mellitus without mention of complication, not stated as uncontrolled     Current Outpatient Prescriptions  Medication Sig Dispense Refill  . albuterol (PROAIR HFA) 108 (90 BASE) MCG/ACT inhaler Inhale 2 puffs into the lungs every 6 (six) hours as needed.        . Alpha-D-Galactosidase (BEANO) TABS Take 5 mg by mouth as needed.        . ANDROGEL PUMP 20.25 MG/ACT (1.62%) GEL 2 pumps daily      . atorvastatin (LIPITOR) 80 MG tablet Take 80 mg by mouth daily.        [name] azelastine (ASTELIN) 137 MCG/SPRAY nasal spray 1 spray by Nasal route daily. Use in each nostril as directed       . B-D ULTRAFINE III SHORT PEN 31G X 8 MM MISC       . CALCIUM-MAGNESIUM PO Take 15 mLs by mouth as needed.        . CELEBREX 200 MG capsule Take 1 tablet by mouth as needed.       . Cholecalciferol (VITAMIN D3) 1000 UNITS CAPS Take 1 capsule by mouth every other day.       . Chromium Picolinate 1000 MCG TABS Take 1 tablet by mouth daily.       . Coenzyme Q10 (COQ10) 100 MG CAPS Take 150 mg by mouth daily.       . colchicine 0.6 MG tablet Take 0.6 mg by mouth daily as needed.        . COLLAGEN PO Take 6 g by mouth daily.        . Cyanocobalamin (VITAMIN B-12) 2500 MCG SUBL Place 1 tablet under the tongue as needed.       . dabigatran (PRADAXA) 150 MG CAPS Take 150 mg by mouth every 12 (twelve) hours.        [name] desonide (DESOWEN) 0.05 % cream Apply 1 application topically as needed.        [name] ESZOPICLONE 3 MG tablet Take 1 tablet by mouth At bedtime as needed.      . fluticasone (VERAMYST) 27.5 MCG/SPRAY nasal spray 2 sprays by Nasal  route daily.        . Fluticasone-Salmeterol (ADVAIR DISKUS) 250-50 MCG/DOSE AEPB Inhale 1 puff into the lungs every 12 (twelve) hours.        . furosemide (LASIX) 20 MG tablet Take 20 mg by mouth daily.        [name] HORSE CHESTNUT PO Take 250 mg by mouth as needed.        . hydrochlorothiazide (,MICROZIDE/HYDRODIURIL,) 12.5 MG capsule Take 12.5 mg by mouth daily.        . hydrocortisone (ANUSOL-HC) 2.5 % rectal cream Place 1 application rectally 2 (two) times daily.        [name] levothyroxine (SYNTHROID, LEVOTHROID) 50 MCG tablet Take 50 mcg by mouth daily.        . Liraglutide (VICTOZA) 18 MG/3ML SOLN Inject 1.8 mLs into the skin daily.        . Loratadine (CLARITIN) 10 MG CAPS Take 1 capsule by mouth daily.        [name]  Melatonin 3 MG TABS Take 1 tablet by mouth at bedtime.        . meloxicam (MOBIC) 15 MG tablet Take 15 mg by mouth as needed.       . metaxalone (SKELAXIN) 800 MG tablet Take 800 mg by mouth 3 (three) times daily as needed.        . metFORMIN (GLUCOPHAGE) 500 MG tablet Take 500 mg by mouth 2 (two) times daily with a meal.        . metoprolol (TOPROL-XL) 100 MG 24 hr tablet Take 100 mg by mouth 2 (two) times daily.        . Misc Natural Products (OSTEO BI-FLEX ADV JOINT SHIELD PO) Take 1,500 mg by mouth daily.        . montelukast (SINGULAIR) 10 MG tablet Take 20 mg by mouth at bedtime.       [name] neomycin-polymyxin-hydrocortisone (CORTISPORIN) otic solution Place 3 drops into the right ear as needed.       . NON FORMULARY cinsulin 500mg  qod       . NON FORMULARY Force Factor 3000mg  qd       . OMEGA-3 KRILL OIL 300 MG CAPS Take 1 capsule by mouth daily.        [name] oxyCODONE (OXY IR/ROXICODONE) 5 MG immediate release tablet Take 2 tablets by mouth At bedtime.      . Pancrelipase, Lip-Prot-Amyl, (CREON) 24000 UNITS CPEP Take by mouth 3 (three) times daily with meals.      . pantoprazole (PROTONIX) 40 MG tablet Take 40 mg by mouth 2 (two) times daily.        . potassium chloride SA  (K-DUR,KLOR-CON) 20 MEQ tablet Take 20 mEq by mouth daily.        . Probiotic Product (PROBIOTIC FORMULA PO) Take 1 capsule by mouth as needed.        . Psyllium (HYDROCIL PO) Take 30 grams in 8 oz of water      . quinapril (ACCUPRIL) 40 MG tablet Take 2 at bedtime      . quiNINE (QUALAQUIN) 324 MG capsule Take 324 mg by mouth daily.        . Safflower Oil 1000 MG CAPS Take 1 capsule by mouth as needed.       . Simethicone (GAS-X PO) Take 180 mg by mouth as needed.        . traMADol (ULTRAM) 50 MG tablet Take 1 tablet by mouth Three times daily as needed.      . Verapamil HCl CR (VERELAN PM) 300 MG CP24 Take 1 capsule by mouth daily.          No Known Allergies  Family History  Problem Relation Age of Onset  . Emphysema Father     died at 12 resp failure  . Heart disease Mother     died at 11 resp failure  . Breast cancer Mother   . Hypertension Sister     History   Social History  . Marital Status: Married    Spouse Name: N/A    Number of Children: N/A  . Years of Education: N/A   Occupational History  . Retired     Music therapist   Social History Main Topics  . Smoking status: Former Games developer  . Smokeless tobacco: Not on file  . Alcohol Use: Not on file  . Drug Use: Not on file  . Sexually Active: Not on file   Other Topics Concern  . Not on  file   Social History Narrative  . No narrative on file    ROS ALL NEGATIVE EXCEPT THOSE NOTED IN HPI  PE  General Appearance: well developed, well nourished in no acute distress, obese HEENT: symmetrical face, PERRLA, good dentition  Neck: no JVD, thyromegaly, or adenopathy, trachea midline Chest: symmetric without deformity Cardiac: PMI non-displaced, irregular rate and rhythm, normal S1, S2, no gallop or murmur Lung: clear to ausculation and percussion Vascular: all pulses full without bruits  Abdominal: nondistended, nontender, good bowel sounds, no HSM, no bruits Extremities: no cyanosis, clubbing or  edema, no sign of DVT, no varicosities  Skin: normal color, no rashes Neuro: alert and oriented x 3, non-focal Pysch: normal affect  EKG Chronic atrial fibrillation, rate 81 beats per minute, nonspecific ST segment changes.  BMET    Component Value Date/Time   NA 138 11/04/2010 0435   K 4.3 DELTA CHECK NOTED 11/04/2010 0435   CL 100 11/04/2010 0435   CO2 29 11/04/2010 0435   GLUCOSE 157* 11/04/2010 0435   BUN 14 11/04/2010 0435   CREATININE 1.92* 11/04/2010 0435   CALCIUM 8.5 11/04/2010 0435   GFRNONAA 35* 11/04/2010 0435   GFRAA  Value: 42        The eGFR has been calculated using the MDRD equation. This calculation has not been validated in all clinical situations. eGFR's persistently <60 mL/min signify possible Chronic Kidney Disease.* 11/04/2010 0435    Lipid Panel  No results found for this basename: chol, trig, hdl, cholhdl, vldl, ldlcalc    CBC    Component Value Date/Time   WBC 11.5* 11/05/2010 0555   RBC 4.02* 11/05/2010 0555   HGB 12.1* 11/05/2010 0555   HCT 36.6* 11/05/2010 0555   PLT 162 11/05/2010 0555   MCV 91.0 11/05/2010 0555   MCH 30.1 11/05/2010 0555   MCHC 33.1 11/05/2010 0555   RDW 14.9 11/05/2010 0555   LYMPHSABS 2.5 07/14/2010 1001   MONOABS 0.6 07/14/2010 1001   EOSABS 0.3 07/14/2010 1001   BASOSABS 0.1 07/14/2010 1001

## 2012-09-18 NOTE — Assessment & Plan Note (Signed)
Asymptomatic with good rate control and anticoagulation. No change in treatment. Stop his anticoagulation for 2 days prior to dental work. Return the office in one year.

## 2012-09-25 DIAGNOSIS — L565 Disseminated superficial actinic porokeratosis (DSAP): Secondary | ICD-10-CM | POA: Diagnosis not present

## 2012-09-25 DIAGNOSIS — C44319 Basal cell carcinoma of skin of other parts of face: Secondary | ICD-10-CM | POA: Diagnosis not present

## 2012-09-26 ENCOUNTER — Ambulatory Visit: Payer: Medicare Other | Admitting: Internal Medicine

## 2012-10-28 DIAGNOSIS — C44319 Basal cell carcinoma of skin of other parts of face: Secondary | ICD-10-CM | POA: Diagnosis not present

## 2012-11-26 ENCOUNTER — Encounter: Payer: Self-pay | Admitting: Internal Medicine

## 2012-11-26 ENCOUNTER — Ambulatory Visit (INDEPENDENT_AMBULATORY_CARE_PROVIDER_SITE_OTHER): Payer: Medicare Other | Admitting: Internal Medicine

## 2012-11-26 VITALS — BP 130/62 | HR 52 | Ht 68.0 in | Wt 250.8 lb

## 2012-11-26 DIAGNOSIS — I4891 Unspecified atrial fibrillation: Secondary | ICD-10-CM

## 2012-11-26 DIAGNOSIS — G4733 Obstructive sleep apnea (adult) (pediatric): Secondary | ICD-10-CM

## 2012-11-26 DIAGNOSIS — R0602 Shortness of breath: Secondary | ICD-10-CM

## 2012-11-26 NOTE — Patient Instructions (Addendum)
We can continue CPAP 12/ [facility]  I suggest you try to check your weight every day. When you feel shortness of breath building, try taking an extra lasix 20 mg for one or 2 days. See if that controls the shortness of breath before it gets bad. You can discuss this strategy with Dr Daleen Squibb as well.

## 2012-11-26 NOTE — Progress Notes (Signed)
Subjective:    Patient ID: Colton Brooks, male    DOB: May 15, 1941, 72 y.o.   MRN: [id number]  03/22/11- HPI 69 [name] followed for dyspnea, OSA, allergic rhinitis, asthma, complicated by DM-II, chronic AFib. Last here- September 22, 2010- note reviewed. No more episodes of dyspnea. Got through right TKR with no respiratory problems.  CPAP remains good, but got off it for a bit while recuperating from knee. If he gets up at 4 AM he won't bother putting it back on. Pressure 12. We talked about alternatives including oral appliances. He is interested in learning more about oral appliances for especially during frequent travel.   09/27/11- 69 [name] followed for dyspnea, OSA, allergic rhinitis, asthma, complicated by DM-II, chronic AFib. He has had flu vaccine. He did go to Dr. [name] and was fitted for an oral appliance (TAP) which she uses an instead of CPAP when traveling because it is more portable. He sleeps better with CPAP. If his breathing is bad because of a cold for asthma, he can't wear CPAP because he can't cough into the mask. He has noted more nocturia with his oral appliance and I discussed that as suggesting his blood pressure goes higher during the night when he is wearing it, leading to renal compensation. Now for 4-5 weeks he has had sinus congestion and otitis with hearing loss. He was evaluated by Dr. [name] and given antibiotic by Dr. [name]. He stays on Claritin, nasal spray and daily saline nasal rinse. Because of his atrial fibrillation he avoids Sudafed. He tolerates Advair twice a day for his asthma and uses a rescue inhaler about twice daily. Arthritis limits walking more than his breathing does, especially in cold weather. CPAP is usually comfortable. He asks for a marginal increase from 11.6-12 CWP. Compliance and control are good.  03/26/12- 69 [name] followed for dyspnea, OSA, allergic rhinitis, asthma, complicated by DM-II, chronic AFib. PCP Dr [name] Wears CPAP every night when  home; otherwise uses oral appliance from Dr [name] when traveling; Recently had sinus infection and cold. CPAP machine/12/[facility] is an old machine. He had sinusitis with some blood in nasal discharge treated by Dr. [name] successfully with antibiotic ? Augmentin.  11/26/12- 69 [name] followed for dyspnea, OSA, allergic rhinitis, asthma, complicated by DM-II, chronic AFib. PCP Dr [name] FOLLOWS FOR: wears CPAP12/ [facility] every night for about 5-7 hours(as long as he is sleeping); pressure is working well for patient. Doing well with CPAP all night every night Occasionally gets very short of breath for 2 or 3 days until he diuresis spontaneously. During these times he will sleep in a chair.  Review of Systems-See HPI Constitutional:   No-   weight loss, night sweats, fevers, chills, fatigue, lassitude. HEENT:   No-  headaches, difficulty swallowing, tooth/dental problems, sore throat,       No-  sneezing, itching, ear ache,  no  -nasal congestion, post nasal drip,  CV:  No-   chest pain, orthopnea, PND, swelling in lower extremities, anasarca, dizziness, palpitations Resp: +shortness of breath with exertion.              No-   productive cough,  No non-productive cough,  No- coughing up of blood.              No-   change in color of mucus.  No- wheezing.   Skin: No-   rash or lesions. GI:  No-   heartburn, indigestion, abdominal pain, nausea, vomiting,  GU: No-   dysuria,  MS:  +Chronic joint pain or swelling.   Neuro-     nothing unusual Psych:  No- change in mood or affect. No depression or anxiety.  No memory loss.   Objective:   Physical Exam BP 130/62  Pulse 52  Ht 5\' 8"  (1.727 m)  Wt 250 lb 12.8 oz (113.762 kg)  BMI 38.13 kg/m2  SpO2 95% General- Alert, Oriented, Affect-appropriate, Distress- none acute; overweight Skin- rash-none, lesions- none, excoriation- none Lymphadenopathy- none Head- atraumatic            Eyes- Gross vision intact, PERRLA, conjunctivae  clear secretions            Ears- Hearing, canals-normal            Nose- Clear, no-Septal dev, mucus, polyps, erosion, perforation             Throat- Mallampati III-IV , mucosa clear , drainage- none, tonsils- atrophic Neck- flexible , trachea midline, no stridor , thyroid nl, carotid no bruit Chest - symmetrical excursion , unlabored           Heart/CV- slow regular pulse , no murmur , no gallop  , no rub, nl s1 s2                           - JVD- none , edema- none, stasis changes- none, varices- none           Lung- clear to P&A, wheeze- none, cough- none , dullness-none, rub- none           Chest wall-  Abd-  Br/ Gen/ Rectal- Not done, not indicated Extrem- cyanosis- none, clubbing, none, atrophy- none, strength- nl, cane Neuro- grossly intact to observation

## 2012-12-07 NOTE — Assessment & Plan Note (Signed)
Adequate compliance and good control. Weight loss would help as discussed.

## 2012-12-07 NOTE — Assessment & Plan Note (Signed)
He is pretty clearly describing intervals of increased dyspnea associated with fluid retention and relieved by spontaneous diuresis. Until he can get back to his cardiologist, I suggested that he check his weight daily. If up by 4 pounds take an extra 20 mg of Lasix that day, before he gets uncomfortably short of breath.

## 2012-12-07 NOTE — Assessment & Plan Note (Addendum)
Rate is almost regular but a little slow today. He does not have a pacemaker. He has not had palpitations or syncope.

## 2012-12-31 DIAGNOSIS — I4891 Unspecified atrial fibrillation: Secondary | ICD-10-CM | POA: Diagnosis not present

## 2012-12-31 DIAGNOSIS — E785 Hyperlipidemia, unspecified: Secondary | ICD-10-CM | POA: Diagnosis not present

## 2012-12-31 DIAGNOSIS — E1129 Type 2 diabetes mellitus with other diabetic kidney complication: Secondary | ICD-10-CM | POA: Diagnosis not present

## 2013-04-09 DIAGNOSIS — H01009 Unspecified blepharitis unspecified eye, unspecified eyelid: Secondary | ICD-10-CM | POA: Diagnosis not present

## 2013-04-09 DIAGNOSIS — H43399 Other vitreous opacities, unspecified eye: Secondary | ICD-10-CM | POA: Diagnosis not present

## 2013-04-09 DIAGNOSIS — H2510 Age-related nuclear cataract, unspecified eye: Secondary | ICD-10-CM | POA: Diagnosis not present

## 2013-04-09 DIAGNOSIS — H04129 Dry eye syndrome of unspecified lacrimal gland: Secondary | ICD-10-CM | POA: Diagnosis not present

## 2013-04-09 DIAGNOSIS — H35379 Puckering of macula, unspecified eye: Secondary | ICD-10-CM | POA: Diagnosis not present

## 2013-04-09 DIAGNOSIS — H40019 Open angle with borderline findings, low risk, unspecified eye: Secondary | ICD-10-CM | POA: Diagnosis not present

## 2013-04-09 DIAGNOSIS — E119 Type 2 diabetes mellitus without complications: Secondary | ICD-10-CM | POA: Diagnosis not present

## 2013-05-27 ENCOUNTER — Encounter: Payer: Self-pay | Admitting: Internal Medicine

## 2013-05-27 ENCOUNTER — Ambulatory Visit (INDEPENDENT_AMBULATORY_CARE_PROVIDER_SITE_OTHER): Payer: Medicare Other | Admitting: Internal Medicine

## 2013-05-27 VITALS — BP 152/82 | HR 60 | Ht 69.75 in | Wt 256.2 lb

## 2013-05-27 DIAGNOSIS — G4733 Obstructive sleep apnea (adult) (pediatric): Secondary | ICD-10-CM | POA: Diagnosis not present

## 2013-05-27 DIAGNOSIS — M1990 Unspecified osteoarthritis, unspecified site: Secondary | ICD-10-CM | POA: Diagnosis not present

## 2013-05-27 DIAGNOSIS — J45909 Unspecified asthma, uncomplicated: Secondary | ICD-10-CM

## 2013-05-27 DIAGNOSIS — R21 Rash and other nonspecific skin eruption: Secondary | ICD-10-CM

## 2013-05-27 DIAGNOSIS — J45998 Other asthma: Secondary | ICD-10-CM

## 2013-05-27 DIAGNOSIS — M130 Polyarthritis, unspecified: Secondary | ICD-10-CM | POA: Diagnosis not present

## 2013-05-27 MED ORDER — SALICYLIC ACID-CLEANSER 6 % LOTION EX KIT: PACK | CUTANEOUS | Status: DC

## 2013-05-27 NOTE — Progress Notes (Signed)
Subjective:    Patient ID: Colton Brooks, male    DOB: 12-21-1940, 72 y.o.   MRN: [id number]  03/22/11- HPI 69 [name] followed for dyspnea, OSA, allergic rhinitis, asthma, complicated by DM-II, chronic AFib. Last here- September 22, 2010- note reviewed. No more episodes of dyspnea. Got through right TKR with no respiratory problems.  CPAP remains good, but got off it for a bit while recuperating from knee. If he gets up at 4 AM he won't bother putting it back on. Pressure 12. We talked about alternatives including oral appliances. He is interested in learning more about oral appliances for especially during frequent travel.   09/27/11- 69 [name] followed for dyspnea, OSA, allergic rhinitis, asthma, complicated by DM-II, chronic AFib. He has had flu vaccine. He did go to Dr. [name] and was fitted for an oral appliance (TAP) which she uses an instead of CPAP when traveling because it is more portable. He sleeps better with CPAP. If his breathing is bad because of a cold for asthma, he can't wear CPAP because he can't cough into the mask. He has noted more nocturia with his oral appliance and I discussed that as suggesting his blood pressure goes higher during the night when he is wearing it, leading to renal compensation. Now for 4-5 weeks he has had sinus congestion and otitis with hearing loss. He was evaluated by Dr. [name] and given antibiotic by Dr. [name]. He stays on Claritin, nasal spray and daily saline nasal rinse. Because of his atrial fibrillation he avoids Sudafed. He tolerates Advair twice a day for his asthma and uses a rescue inhaler about twice daily. Arthritis limits walking more than his breathing does, especially in cold weather. CPAP is usually comfortable. He asks for a marginal increase from 11.6-12 CWP. Compliance and control are good.  03/26/12- 69 [name] followed for dyspnea, OSA, allergic rhinitis, asthma, complicated by DM-II, chronic AFib. PCP Dr [name] Wears CPAP every night when  home; otherwise uses oral appliance from Dr [name] when traveling; Recently had sinus infection and cold. CPAP machine/12/[facility] is an old machine. He had sinusitis with some blood in nasal discharge treated by Dr. [name] successfully with antibiotic ? Augmentin.  11/26/12- 69 [name] followed for dyspnea, OSA, allergic rhinitis, asthma, complicated by DM-II, chronic AFib. PCP Dr [name] FOLLOWS FOR: wears CPAP12/ [facility] every night for about 5-7 hours(as long as he is sleeping); pressure is working well for patient. Doing well with CPAP all night every night Occasionally gets very short of breath for 2 or 3 days until he diureses spontaneously. During these times he will sleep in a chair.  05/27/13- 69 [name] followed for dyspnea, OSA, allergic rhinitis, asthma, complicated by DM-II, chronic AFib. FOLLOWS FOR: 6 month follow up - reports sleeping well.  reports wears CPAP 12/ [facility]  99% of the time.  no new supplies needed at this time. Celebrex stopped because it made his chest tight. Activity limited by arthritis pain. Dyspnea associated with deconditioning and overweight. He does remain chronic atrial fibrillation. CXR 03/29/12 IMPRESSION:  1. No acute cardiopulmonary abnormalities.  Original Report Authenticated By: Rosealee Albee, M.D.  Review of Systems-See HPI Constitutional:   No-   weight loss, night sweats, fevers, chills, fatigue, lassitude. HEENT:   No-  headaches, difficulty swallowing, tooth/dental problems, sore throat,       No-  sneezing, itching, ear ache,  no  -nasal congestion, post nasal drip,  CV:  No-   chest  pain, orthopnea, PND, swelling in lower extremities, anasarca, dizziness, palpitations Resp: +shortness of breath with exertion.              No-   productive cough,  No non-productive cough,  No- coughing up of blood.              No-   change in color of mucus.  No- wheezing.   Skin: No-   rash or lesions. GI:  No-   heartburn, indigestion, abdominal pain,  nausea, vomiting,  GU: No-   dysuria,  MS:  +Chronic joint pain or swelling.   Neuro-     nothing unusual Psych:  No- change in mood or affect. No depression or anxiety.  No memory loss.   Objective:   Physical Exam  General- Alert, Oriented, Affect-appropriate, Distress- none acute; overweight Skin- rash-none, lesions- none, excoriation- none Lymphadenopathy- none Head- atraumatic            Eyes- Gross vision intact, PERRLA, conjunctivae clear secretions            Ears- Hearing, canals-normal            Nose- Clear, no-Septal dev, mucus, polyps, erosion, perforation             Throat- Mallampati III-IV , mucosa clear , drainage- none, tonsils- atrophic Neck- flexible , trachea midline, no stridor , thyroid nl, carotid no bruit Chest - symmetrical excursion , unlabored           Heart/CV- +nearly regular pulse , no murmur , no gallop  , no rub, nl s1 s2                           - JVD- none , edema-+2, stasis changes-+, varices- none           Lung- clear to P&A, wheeze- none, cough- none , dullness-none, rub- none           Chest wall-  Abd-  Br/ Gen/ Rectal- Not done, not indicated Extrem- cyanosis- none, clubbing, none, atrophy- none, strength- nl, +cane Neuro- grossly intact to observation

## 2013-05-27 NOTE — Patient Instructions (Addendum)
Script printed for [place] salicyclic acid lotion  We can continue CPAP 12/ [facility]  Referral to Rheumatologist Dr Alben Deeds group     Dx osteoarthritis, dyspnea from celebrex

## 2013-06-10 ENCOUNTER — Encounter: Payer: Self-pay | Admitting: Internal Medicine

## 2013-06-10 DIAGNOSIS — R21 Rash and other nonspecific skin eruption: Secondary | ICD-10-CM | POA: Insufficient documentation

## 2013-06-10 DIAGNOSIS — M130 Polyarthritis, unspecified: Secondary | ICD-10-CM | POA: Insufficient documentation

## 2013-06-10 NOTE — Assessment & Plan Note (Signed)
Continued good compliance and control. We discussed options but will make no changes.

## 2013-06-10 NOTE — Assessment & Plan Note (Signed)
He has had some areas of skin irritation treated over the years with salicylic acid lotion. He asks refill. Plan-refill 6% salicylic acid lotion for skin lesions

## 2013-06-10 NOTE — Assessment & Plan Note (Signed)
He needs help managing his arthritis pain. Expresses strong interest I suggested rheumatologist. Plan-rheumatology referral.

## 2013-06-10 NOTE — Assessment & Plan Note (Signed)
Generally good control. Activity is limited more by his deconditioning, atrial fibrillation and arthritis.

## 2013-07-01 DIAGNOSIS — M1990 Unspecified osteoarthritis, unspecified site: Secondary | ICD-10-CM | POA: Diagnosis not present

## 2013-07-01 DIAGNOSIS — M2550 Pain in unspecified joint: Secondary | ICD-10-CM | POA: Diagnosis not present

## 2013-07-15 DIAGNOSIS — M109 Gout, unspecified: Secondary | ICD-10-CM | POA: Diagnosis not present

## 2013-07-15 DIAGNOSIS — M1990 Unspecified osteoarthritis, unspecified site: Secondary | ICD-10-CM | POA: Diagnosis not present

## 2013-07-15 DIAGNOSIS — N289 Disorder of kidney and ureter, unspecified: Secondary | ICD-10-CM | POA: Diagnosis not present

## 2013-08-19 DIAGNOSIS — M1990 Unspecified osteoarthritis, unspecified site: Secondary | ICD-10-CM | POA: Diagnosis not present

## 2013-08-19 DIAGNOSIS — M109 Gout, unspecified: Secondary | ICD-10-CM | POA: Diagnosis not present

## 2013-08-19 DIAGNOSIS — N289 Disorder of kidney and ureter, unspecified: Secondary | ICD-10-CM | POA: Diagnosis not present

## 2013-08-26 ENCOUNTER — Encounter: Payer: Self-pay | Admitting: Cardiology

## 2013-08-26 DIAGNOSIS — Z125 Encounter for screening for malignant neoplasm of prostate: Secondary | ICD-10-CM | POA: Diagnosis not present

## 2013-08-26 DIAGNOSIS — I10 Essential (primary) hypertension: Secondary | ICD-10-CM | POA: Diagnosis not present

## 2013-08-26 DIAGNOSIS — E039 Hypothyroidism, unspecified: Secondary | ICD-10-CM | POA: Diagnosis not present

## 2013-08-26 DIAGNOSIS — E785 Hyperlipidemia, unspecified: Secondary | ICD-10-CM | POA: Diagnosis not present

## 2013-08-26 DIAGNOSIS — E1129 Type 2 diabetes mellitus with other diabetic kidney complication: Secondary | ICD-10-CM | POA: Diagnosis not present

## 2013-08-26 DIAGNOSIS — E291 Testicular hypofunction: Secondary | ICD-10-CM | POA: Diagnosis not present

## 2013-08-26 DIAGNOSIS — R809 Proteinuria, unspecified: Secondary | ICD-10-CM | POA: Diagnosis not present

## 2013-09-04 DIAGNOSIS — M109 Gout, unspecified: Secondary | ICD-10-CM | POA: Diagnosis not present

## 2013-09-04 DIAGNOSIS — E1129 Type 2 diabetes mellitus with other diabetic kidney complication: Secondary | ICD-10-CM | POA: Diagnosis not present

## 2013-09-04 DIAGNOSIS — G4733 Obstructive sleep apnea (adult) (pediatric): Secondary | ICD-10-CM | POA: Diagnosis not present

## 2013-09-04 DIAGNOSIS — J45909 Unspecified asthma, uncomplicated: Secondary | ICD-10-CM | POA: Diagnosis not present

## 2013-09-04 DIAGNOSIS — Z1331 Encounter for screening for depression: Secondary | ICD-10-CM | POA: Diagnosis not present

## 2013-09-04 DIAGNOSIS — I10 Essential (primary) hypertension: Secondary | ICD-10-CM | POA: Diagnosis not present

## 2013-09-04 DIAGNOSIS — I4891 Unspecified atrial fibrillation: Secondary | ICD-10-CM | POA: Diagnosis not present

## 2013-09-04 DIAGNOSIS — E785 Hyperlipidemia, unspecified: Secondary | ICD-10-CM | POA: Diagnosis not present

## 2013-09-04 DIAGNOSIS — Z0000 Encounter for general adult medical examination without abnormal findings: Secondary | ICD-10-CM | POA: Diagnosis not present

## 2013-09-04 DIAGNOSIS — Z23 Encounter for immunization: Secondary | ICD-10-CM | POA: Diagnosis not present

## 2013-09-17 ENCOUNTER — Ambulatory Visit (INDEPENDENT_AMBULATORY_CARE_PROVIDER_SITE_OTHER): Payer: Medicare Other | Admitting: Cardiology

## 2013-09-17 ENCOUNTER — Encounter: Payer: Self-pay | Admitting: Cardiology

## 2013-09-17 VITALS — BP 116/66 | HR 40 | Ht 69.0 in | Wt 257.0 lb

## 2013-09-17 DIAGNOSIS — I4891 Unspecified atrial fibrillation: Secondary | ICD-10-CM | POA: Diagnosis not present

## 2013-09-17 NOTE — Patient Instructions (Signed)
Your physician wants you to follow-up in:  12 months.  You will receive a reminder letter in the mail two months in advance. If you don't receive a letter, please call our office to schedule the follow-up appointment.   

## 2013-09-17 NOTE — Progress Notes (Signed)
Patient ID: Colton Brooks, male   DOB: 05-21-1941, 72 y.o.   MRN: [id number]  HPI [name] returns today for evaluation and management of his chronic A. fib and anticoagulation. He is totally asymptomatic. He denies any presyncope, syncope, palpitations or chest pain. He's had no bleeding. His HR has been always in the 40-60 range, but he is asymptomatic.  Past Medical History  Diagnosis Date  . Allergic rhinitis, cause unspecified   . Asthma   . AF (atrial fibrillation)   . OSA on CPAP   . Type II or unspecified type diabetes mellitus without mention of complication, not stated as uncontrolled     Current Outpatient Prescriptions  Medication Sig Dispense Refill  . ELIQUIS 5 MG TABS tablet Take 1 tablet by mouth 2 (two) times daily.      [name] albuterol (PROAIR HFA) 108 (90 BASE) MCG/ACT inhaler Inhale 2 puffs into the lungs every 6 (six) hours as needed.        . Alpha-D-Galactosidase (BEANO) TABS Take 5 mg by mouth as needed.        . ANDROGEL PUMP 20.25 MG/ACT (1.62%) GEL 2 pumps daily      . atorvastatin (LIPITOR) 80 MG tablet Take 80 mg by mouth daily.        [name] azelastine (ASTELIN) 137 MCG/SPRAY nasal spray 1 spray by Nasal route daily. Use in each nostril as directed       . B-D ULTRAFINE III SHORT PEN 31G X 8 MM MISC       . CALCIUM-MAGNESIUM PO Take 15 mLs by mouth as needed.        . Cholecalciferol (VITAMIN D3) 1000 UNITS CAPS Take 1 capsule by mouth every other day.       . Chromium Picolinate 1000 MCG TABS Take 1 tablet by mouth daily.       . Coenzyme Q10 (COQ10) 100 MG CAPS Take 150 mg by mouth daily.       . colchicine 0.6 MG tablet Take 0.6 mg by mouth daily as needed.        . COLLAGEN PO Take 6 g by mouth daily.        . Cyanocobalamin (VITAMIN B-12) 2500 MCG SUBL Place 1 tablet under the tongue as needed.       . dabigatran (PRADAXA) 150 MG CAPS Take 150 mg by mouth every 12 (twelve) hours.        [name] desonide (DESOWEN) 0.05 % cream Apply 1 application topically  as needed.        [name] ESZOPICLONE 3 MG tablet Take 1 tablet by mouth At bedtime as needed.      . fluticasone (VERAMYST) 27.5 MCG/SPRAY nasal spray 2 sprays by Nasal route daily.        . Fluticasone-Salmeterol (ADVAIR DISKUS) 250-50 MCG/DOSE AEPB Inhale 1 puff into the lungs every 12 (twelve) hours.        . furosemide (LASIX) 20 MG tablet Take 20 mg by mouth daily.        [name] HORSE CHESTNUT PO Take 250 mg by mouth as needed.        . hydrochlorothiazide (HYDRODIURIL) 25 MG tablet Take 25 mg by mouth daily.      . hydrocortisone (ANUSOL-HC) 2.5 % rectal cream Place 1 application rectally 2 (two) times daily.        [name] levothyroxine (SYNTHROID, LEVOTHROID) 50 MCG tablet Take 50 mcg by mouth daily.        [name]  Liraglutide (VICTOZA) 18 MG/3ML SOLN Inject 1.8 mLs into the skin daily.        . Loratadine (CLARITIN) 10 MG CAPS Take 1 capsule by mouth daily.        . Melatonin 3 MG TABS Take 1 tablet by mouth at bedtime.        . meloxicam (MOBIC) 15 MG tablet Take 15 mg by mouth as needed.       . metaxalone (SKELAXIN) 800 MG tablet Take 800 mg by mouth 3 (three) times daily as needed.        . metFORMIN (GLUCOPHAGE) 500 MG tablet Take 500 mg by mouth 2 (two) times daily with a meal.        . metoprolol (TOPROL-XL) 100 MG 24 hr tablet Take 100 mg by mouth 2 (two) times daily.        . Misc Natural Products (OSTEO BI-FLEX ADV JOINT SHIELD PO) Take 1,500 mg by mouth daily.        . montelukast (SINGULAIR) 10 MG tablet Take 20 mg by mouth at bedtime.       . NON FORMULARY cinsulin 500mg  qod       . NON FORMULARY Force Factor 3000mg  qd       . OMEGA-3 KRILL OIL 300 MG CAPS Take 1 capsule by mouth daily.        [name] oxyCODONE (OXY IR/ROXICODONE) 5 MG immediate release tablet Take 2 tablets by mouth At bedtime.      . Pancrelipase, Lip-Prot-Amyl, (CREON) 24000 UNITS CPEP Take by mouth 3 (three) times daily with meals.      . pantoprazole (PROTONIX) 40 MG tablet Take 40 mg by mouth 2 (two) times daily.        .  potassium chloride SA (K-DUR,KLOR-CON) 20 MEQ tablet Take 20 mEq by mouth daily.        . Probiotic Product (PROBIOTIC FORMULA PO) Take 1 capsule by mouth as needed.        . Psyllium (HYDROCIL PO) Take 30 grams in 8 oz of water      . quinapril (ACCUPRIL) 40 MG tablet Take 2 at bedtime      . quiNINE (QUALAQUIN) 324 MG capsule Take 324 mg by mouth daily.        . Safflower Oil 1000 MG CAPS Take 1 capsule by mouth as needed.       . Salicylic Acid-Cleanser 6 % (LOTION) KIT Apply topically and occlude area at night  1 kit  prn  . Simethicone (GAS-X PO) Take 180 mg by mouth as needed.        . traMADol (ULTRAM) 50 MG tablet Take 1 tablet by mouth Three times daily as needed.      . Verapamil HCl CR (VERELAN PM) 300 MG CP24 Take 1 capsule by mouth daily.         No current facility-administered medications for this visit.    No Known Allergies  Family History  Problem Relation Age of Onset  . Emphysema Father     died at 69 resp failure  . Heart disease Mother     died at 37 resp failure  . Breast cancer Mother   . Hypertension Sister     History   Social History  . Marital Status: Married    Spouse Name: N/A    Number of Children: N/A  . Years of Education: N/A   Occupational History  . Retired     Music therapist  Social History Main Topics  . Smoking status: Former Smoker -- 1.00 packs/day for 26 years    Types: Cigarettes    Quit date: 11/20/1982  . Smokeless tobacco: Not on file  . Alcohol Use: Not on file  . Drug Use: Not on file  . Sexual Activity: Not on file   Other Topics Concern  . Not on file   Social History Narrative  . No narrative on file    ROS ALL NEGATIVE EXCEPT THOSE NOTED IN HPI  PE Blood pressure 116/66 mmHg, heart rate 40 beats per minute General Appearance: well developed, well nourished in no acute distress, obese HEENT: symmetrical face, PERRLA, good dentition  Neck: no JVD, thyromegaly, or adenopathy, trachea midline Chest:  symmetric without deformity Cardiac: PMI non-displaced, irregular rate and rhythm, normal S1, S2, no gallop or murmur Lung: clear to ausculation and percussion Vascular: all pulses full without bruits  Abdominal: nondistended, nontender, good bowel sounds, no HSM, no bruits Extremities: no cyanosis, clubbing or edema, no sign of DVT, no varicosities  Skin: normal color, no rashes Neuro: alert and oriented x 3, non-focal Pysch: normal affect  EKG Chronic atrial fibrillation, rate 40 beats per minute, nonspecific ST segment changes.  LABS: from an outside lab: 08/26/2013 Glucose 212, BUN 19, creatinine 1.4, GFR 49.5, sodium 137, potassium 4.3, AST 20, ALT 17, apolipoprotein B. 61, TSH 2.0, free T4-1 0.0, hemoglobin A1c 7.4, triacylglycerides 107, HDL 28, LDL 54    Assessment and plan:  72 year old gentleman previously followed by Dr. Daleen Squibb for chronic A. Fib.  1. Chronic A. Fib - with slow ventricular response, however the patient is physically very active, and asymptomatic. Currently on anticoagulation with Eliquis. The patient was offered lowering the dose of metoprolol succinate  that is currently at 100 mg daily, however he is concerned that his blood pressure would become elevated and he assures me that he is completely asymptomatic and his heart rate was always been low.  2. Hypertension - well controlled on current regimen.  3. Hyperlipidemia - Good LDL and TAG, low HDL despite lipitor 80 mg daily and a high exercise level. We will continue the same regimen as the patient doesn't have a h/o CAD.  Follow up in 1 year.

## 2013-09-22 DIAGNOSIS — H40019 Open angle with borderline findings, low risk, unspecified eye: Secondary | ICD-10-CM | POA: Diagnosis not present

## 2013-09-22 DIAGNOSIS — H10509 Unspecified blepharoconjunctivitis, unspecified eye: Secondary | ICD-10-CM | POA: Diagnosis not present

## 2013-09-25 ENCOUNTER — Other Ambulatory Visit: Payer: Self-pay

## 2013-11-05 DIAGNOSIS — M109 Gout, unspecified: Secondary | ICD-10-CM | POA: Diagnosis not present

## 2013-11-05 DIAGNOSIS — N289 Disorder of kidney and ureter, unspecified: Secondary | ICD-10-CM | POA: Diagnosis not present

## 2013-11-05 DIAGNOSIS — M1990 Unspecified osteoarthritis, unspecified site: Secondary | ICD-10-CM | POA: Diagnosis not present

## 2013-11-26 ENCOUNTER — Ambulatory Visit (INDEPENDENT_AMBULATORY_CARE_PROVIDER_SITE_OTHER): Payer: Medicare Other | Admitting: Internal Medicine

## 2013-11-26 ENCOUNTER — Encounter: Payer: Self-pay | Admitting: Internal Medicine

## 2013-11-26 VITALS — BP 124/66 | HR 67 | Ht 68.75 in | Wt 234.2 lb

## 2013-11-26 DIAGNOSIS — J45998 Other asthma: Secondary | ICD-10-CM

## 2013-11-26 DIAGNOSIS — J45909 Unspecified asthma, uncomplicated: Secondary | ICD-10-CM

## 2013-11-26 DIAGNOSIS — J309 Allergic rhinitis, unspecified: Secondary | ICD-10-CM

## 2013-11-26 DIAGNOSIS — J3089 Other allergic rhinitis: Secondary | ICD-10-CM

## 2013-11-26 DIAGNOSIS — J302 Other seasonal allergic rhinitis: Secondary | ICD-10-CM

## 2013-11-26 DIAGNOSIS — G4733 Obstructive sleep apnea (adult) (pediatric): Secondary | ICD-10-CM | POA: Diagnosis not present

## 2013-11-26 NOTE — Patient Instructions (Signed)
Order- DME [facility] increase CPAP to 13     Dx OSA

## 2013-11-26 NOTE — Progress Notes (Signed)
Subjective:    Patient ID: Colton Brooks, male    DOB: [birth year], 73 y.o.   MRN: [id number]  03/22/11- HPI 47 [name] followed for dyspnea, OSA, allergic rhinitis, asthma, complicated by DM-II, chronic AFib. Last here- September 22, 2010- note reviewed. No more episodes of dyspnea. Got through right TKR with no respiratory problems.  CPAP remains good, but got off it for a bit while recuperating from knee. If he gets up at 4 AM he won't bother putting it back on. Pressure 12. We talked about alternatives including oral appliances. He is interested in learning more about oral appliances for especially during frequent travel.   09/27/11- [name] followed for dyspnea, OSA, allergic rhinitis, asthma, complicated by DM-II, chronic AFib. He has had flu vaccine. He did go to Dr. Ron Parker and was fitted for an oral appliance (TAP) which she uses an instead of CPAP when traveling because it is more portable. He sleeps better with CPAP. If his breathing is bad because of a cold for asthma, he can't wear CPAP because he can't cough into the mask. He has noted more nocturia with his oral appliance and I discussed that as suggesting his blood pressure goes higher during the night when he is wearing it, leading to renal compensation. Now for 4-5 weeks he has had sinus congestion and otitis with hearing loss. He was evaluated by Dr. Johnnette Gourd and given antibiotic by Dr. Brigitte Pulse. He stays on Claritin, nasal spray and daily saline nasal rinse. Because of his atrial fibrillation he avoids Sudafed. He tolerates Advair twice a day for his asthma and uses a rescue inhaler about twice daily. Arthritis limits walking more than his breathing does, especially in cold weather. CPAP is usually comfortable. He asks for a marginal increase from 11.6-12 CWP. Compliance and control are good.  03/26/12- [name] followed for dyspnea, OSA, allergic rhinitis, asthma, complicated by DM-II, chronic AFib. PCP Dr Lang Snow Wears CPAP every night when  home; otherwise uses oral appliance from Dr Oneal Grout when traveling; Recently had sinus infection and cold. CPAP machine/12/[facility] is an old machine. He had sinusitis with some blood in nasal discharge treated by Dr. Redmond Baseman successfully with antibiotic ? Augmentin.  11/26/12- [name] followed for dyspnea, OSA, allergic rhinitis, asthma, complicated by DM-II, chronic AFib. PCP Dr Lang Snow FOLLOWS FOR: wears CPAP12/ [facility] every night for about 5-7 hours(as long as he is sleeping); pressure is working well for patient. Doing well with CPAP all night every night Occasionally gets very short of breath for 2 or 3 days until he diureses spontaneously. During these times he will sleep in a chair.  05/27/13- [name] followed for dyspnea, OSA, allergic rhinitis, asthma, complicated by DM-II, chronic AFib. FOLLOWS FOR: 6 month follow up - reports sleeping well.  reports wears CPAP 12/ AHP  99% of the time.  no new supplies needed at this time. Celebrex stopped because it made his chest tight. Activity limited by arthritis pain. Dyspnea associated with deconditioning and overweight. He does remain chronic atrial fibrillation. CXR 03/29/12 IMPRESSION:  1. No acute cardiopulmonary abnormalities.  Original Report Authenticated By: Angelita Ingles, M.D.  11/26/13- [name] followed for dyspnea, OSA, allergic rhinitis, asthma, complicated by DM-II, chronic AFib, gout follows for- wears CPAP 12/ AHP nightly, X5-5.5 hours/night.  Pt denies any problems with his new machine or supplies. Has established with a rheumatologist for his arthritis/gout. Continues CPAP at 12 with good compliance and control, but he would like  to try a small pressure increased for comparison.  Review of Systems-See HPI Constitutional:   No-   weight loss, night sweats, fevers, chills, fatigue, lassitude. HEENT:   No-  headaches, difficulty swallowing, tooth/dental problems, sore throat,       No-  sneezing, itching, ear ache,  no   -nasal congestion, post nasal drip,  CV:  No-   chest pain, orthopnea, PND, swelling in lower extremities, anasarca, dizziness, palpitations Resp: +shortness of breath with exertion.              No-   productive cough,  No non-productive cough,  No- coughing up of blood.              No-   change in color of mucus.  No- wheezing.   Skin: No-   rash or lesions. GI:  No-   heartburn, indigestion, abdominal pain, nausea, vomiting,  GU: No-   dysuria,  MS:  +Chronic joint pain or swelling.   Neuro-     nothing unusual Psych:  No- change in mood or affect. No depression or anxiety.  No memory loss.   Objective:   Physical Exam  General- Alert, Oriented, Affect-appropriate, Distress- none acute; overweight Skin- rash-none, lesions- none, excoriation- none Lymphadenopathy- none Head- atraumatic            Eyes- Gross vision intact, PERRLA, conjunctivae clear secretions            Ears- Hearing, canals-normal            Nose- Clear, no-Septal dev, mucus, polyps, erosion, perforation             Throat- Mallampati III-IV , mucosa clear , drainage- none, tonsils- atrophic Neck- flexible , trachea midline, no stridor , thyroid nl, carotid no bruit Chest - symmetrical excursion , unlabored           Heart/CV- +nearly regular pulse/ Atrial fib , no murmur , no gallop  , no rub, nl s1 s2                           - JVD- none , edema-+2, stasis changes-+, varices- none           Lung- clear to P&A, wheeze- none, cough- none , dullness-none, rub- none           Chest wall-  Abd-  Br/ Gen/ Rectal- Not done, not indicated Extrem- cyanosis- none, clubbing, none, atrophy- none, strength- nl, +cane Neuro- grossly intact to observation

## 2013-12-17 DIAGNOSIS — H524 Presbyopia: Secondary | ICD-10-CM | POA: Diagnosis not present

## 2013-12-17 DIAGNOSIS — H40019 Open angle with borderline findings, low risk, unspecified eye: Secondary | ICD-10-CM | POA: Diagnosis not present

## 2013-12-17 DIAGNOSIS — H538 Other visual disturbances: Secondary | ICD-10-CM | POA: Diagnosis not present

## 2013-12-17 DIAGNOSIS — H2510 Age-related nuclear cataract, unspecified eye: Secondary | ICD-10-CM | POA: Diagnosis not present

## 2013-12-20 NOTE — Assessment & Plan Note (Signed)
controlled 

## 2013-12-20 NOTE — Assessment & Plan Note (Signed)
Controlled, pending onset of spring pollen season

## 2013-12-20 NOTE — Assessment & Plan Note (Signed)
Good compliance and control. At his request we are going to move pressure up to 13 for comparison.

## 2013-12-24 DIAGNOSIS — H269 Unspecified cataract: Secondary | ICD-10-CM | POA: Diagnosis not present

## 2013-12-24 DIAGNOSIS — H2510 Age-related nuclear cataract, unspecified eye: Secondary | ICD-10-CM | POA: Diagnosis not present

## 2014-01-08 DIAGNOSIS — E785 Hyperlipidemia, unspecified: Secondary | ICD-10-CM | POA: Diagnosis not present

## 2014-01-08 DIAGNOSIS — M109 Gout, unspecified: Secondary | ICD-10-CM | POA: Diagnosis not present

## 2014-01-08 DIAGNOSIS — E039 Hypothyroidism, unspecified: Secondary | ICD-10-CM | POA: Diagnosis not present

## 2014-01-08 DIAGNOSIS — E1129 Type 2 diabetes mellitus with other diabetic kidney complication: Secondary | ICD-10-CM | POA: Diagnosis not present

## 2014-01-08 DIAGNOSIS — I4891 Unspecified atrial fibrillation: Secondary | ICD-10-CM | POA: Diagnosis not present

## 2014-01-08 DIAGNOSIS — Z23 Encounter for immunization: Secondary | ICD-10-CM | POA: Diagnosis not present

## 2014-01-08 DIAGNOSIS — I10 Essential (primary) hypertension: Secondary | ICD-10-CM | POA: Diagnosis not present

## 2014-01-08 DIAGNOSIS — J45909 Unspecified asthma, uncomplicated: Secondary | ICD-10-CM | POA: Diagnosis not present

## 2014-01-08 DIAGNOSIS — N183 Chronic kidney disease, stage 3 unspecified: Secondary | ICD-10-CM | POA: Diagnosis not present

## 2014-02-03 DIAGNOSIS — H25019 Cortical age-related cataract, unspecified eye: Secondary | ICD-10-CM | POA: Diagnosis not present

## 2014-02-03 DIAGNOSIS — H2510 Age-related nuclear cataract, unspecified eye: Secondary | ICD-10-CM | POA: Diagnosis not present

## 2014-02-10 DIAGNOSIS — H269 Unspecified cataract: Secondary | ICD-10-CM | POA: Diagnosis not present

## 2014-02-10 DIAGNOSIS — H2510 Age-related nuclear cataract, unspecified eye: Secondary | ICD-10-CM | POA: Diagnosis not present

## 2014-04-29 DIAGNOSIS — N289 Disorder of kidney and ureter, unspecified: Secondary | ICD-10-CM | POA: Diagnosis not present

## 2014-04-29 DIAGNOSIS — M109 Gout, unspecified: Secondary | ICD-10-CM | POA: Diagnosis not present

## 2014-04-29 DIAGNOSIS — M1990 Unspecified osteoarthritis, unspecified site: Secondary | ICD-10-CM | POA: Diagnosis not present

## 2014-04-29 DIAGNOSIS — R059 Cough, unspecified: Secondary | ICD-10-CM | POA: Diagnosis not present

## 2014-04-29 DIAGNOSIS — R05 Cough: Secondary | ICD-10-CM | POA: Diagnosis not present

## 2014-05-13 DIAGNOSIS — M109 Gout, unspecified: Secondary | ICD-10-CM | POA: Diagnosis not present

## 2014-05-13 DIAGNOSIS — E785 Hyperlipidemia, unspecified: Secondary | ICD-10-CM | POA: Diagnosis not present

## 2014-05-13 DIAGNOSIS — F329 Major depressive disorder, single episode, unspecified: Secondary | ICD-10-CM | POA: Diagnosis not present

## 2014-05-13 DIAGNOSIS — E669 Obesity, unspecified: Secondary | ICD-10-CM | POA: Diagnosis not present

## 2014-05-13 DIAGNOSIS — I10 Essential (primary) hypertension: Secondary | ICD-10-CM | POA: Diagnosis not present

## 2014-05-13 DIAGNOSIS — N183 Chronic kidney disease, stage 3 unspecified: Secondary | ICD-10-CM | POA: Diagnosis not present

## 2014-05-13 DIAGNOSIS — E1129 Type 2 diabetes mellitus with other diabetic kidney complication: Secondary | ICD-10-CM | POA: Diagnosis not present

## 2014-05-13 DIAGNOSIS — I4891 Unspecified atrial fibrillation: Secondary | ICD-10-CM | POA: Diagnosis not present

## 2014-09-15 DIAGNOSIS — Z1389 Encounter for screening for other disorder: Secondary | ICD-10-CM | POA: Diagnosis not present

## 2014-09-15 DIAGNOSIS — I480 Paroxysmal atrial fibrillation: Secondary | ICD-10-CM | POA: Diagnosis not present

## 2014-09-15 DIAGNOSIS — M1990 Unspecified osteoarthritis, unspecified site: Secondary | ICD-10-CM | POA: Diagnosis not present

## 2014-09-15 DIAGNOSIS — J45909 Unspecified asthma, uncomplicated: Secondary | ICD-10-CM | POA: Diagnosis not present

## 2014-09-15 DIAGNOSIS — E1129 Type 2 diabetes mellitus with other diabetic kidney complication: Secondary | ICD-10-CM | POA: Diagnosis not present

## 2014-09-15 DIAGNOSIS — I10 Essential (primary) hypertension: Secondary | ICD-10-CM | POA: Diagnosis not present

## 2014-09-15 DIAGNOSIS — E785 Hyperlipidemia, unspecified: Secondary | ICD-10-CM | POA: Diagnosis not present

## 2014-09-15 DIAGNOSIS — E669 Obesity, unspecified: Secondary | ICD-10-CM | POA: Diagnosis not present

## 2014-09-15 DIAGNOSIS — Z23 Encounter for immunization: Secondary | ICD-10-CM | POA: Diagnosis not present

## 2014-10-06 DIAGNOSIS — H401232 Low-tension glaucoma, bilateral, moderate stage: Secondary | ICD-10-CM | POA: Diagnosis not present

## 2014-10-06 DIAGNOSIS — E119 Type 2 diabetes mellitus without complications: Secondary | ICD-10-CM | POA: Diagnosis not present

## 2014-10-06 DIAGNOSIS — Z961 Presence of intraocular lens: Secondary | ICD-10-CM | POA: Diagnosis not present

## 2014-10-07 DIAGNOSIS — Z6833 Body mass index (BMI) 33.0-33.9, adult: Secondary | ICD-10-CM | POA: Diagnosis not present

## 2014-10-07 DIAGNOSIS — N183 Chronic kidney disease, stage 3 (moderate): Secondary | ICD-10-CM | POA: Diagnosis not present

## 2014-10-07 DIAGNOSIS — I10 Essential (primary) hypertension: Secondary | ICD-10-CM | POA: Diagnosis not present

## 2014-10-07 DIAGNOSIS — E1129 Type 2 diabetes mellitus with other diabetic kidney complication: Secondary | ICD-10-CM | POA: Diagnosis not present

## 2014-10-22 DIAGNOSIS — M1A09X0 Idiopathic chronic gout, multiple sites, without tophus (tophi): Secondary | ICD-10-CM | POA: Diagnosis not present

## 2014-10-22 DIAGNOSIS — M150 Primary generalized (osteo)arthritis: Secondary | ICD-10-CM | POA: Diagnosis not present

## 2014-10-23 ENCOUNTER — Ambulatory Visit (INDEPENDENT_AMBULATORY_CARE_PROVIDER_SITE_OTHER): Payer: Medicare Other | Admitting: Cardiology

## 2014-10-23 ENCOUNTER — Encounter: Payer: Self-pay | Admitting: Cardiology

## 2014-10-23 VITALS — BP 148/64 | HR 44 | Ht 68.75 in | Wt 234.0 lb

## 2014-10-23 DIAGNOSIS — I481 Persistent atrial fibrillation: Secondary | ICD-10-CM

## 2014-10-23 DIAGNOSIS — R0600 Dyspnea, unspecified: Secondary | ICD-10-CM

## 2014-10-23 DIAGNOSIS — I10 Essential (primary) hypertension: Secondary | ICD-10-CM | POA: Diagnosis not present

## 2014-10-23 DIAGNOSIS — E785 Hyperlipidemia, unspecified: Secondary | ICD-10-CM | POA: Diagnosis not present

## 2014-10-23 DIAGNOSIS — I4819 Other persistent atrial fibrillation: Secondary | ICD-10-CM

## 2014-10-23 DIAGNOSIS — R0609 Other forms of dyspnea: Secondary | ICD-10-CM | POA: Diagnosis not present

## 2014-10-23 NOTE — Progress Notes (Signed)
Patient ID: Colton Brooks, male   DOB: December 19, 1940, 73 y.o.   MRN: [id number]      HPI Mr Patlan returns today for evaluation and management of his chronic A. fib and anticoagulation. He has chronic bradycardia but denies any dizziness or syncope. He states that he only would feel dizzy when he stands up fast. He otherwise denies any chest pain. He had cardiac catheterization about 3 years ago that was completely normal. He admits to occasional dyspnea on exertion but when he cut down on NSAIDs it has completely resolved. He feels very well and is compliant with all his meds. He's had no bleeding. He is followed for hyperlipidemia by his primary care physician, he just missed the appointment and is scheduled for another appointment in March but will include lipid panel drawing.  Past Medical History  Diagnosis Date  . Allergic rhinitis, cause unspecified   . Asthma   . AF (atrial fibrillation)   . OSA on CPAP   . Type II or unspecified type diabetes mellitus without mention of complication, not stated as uncontrolled     Current Outpatient Prescriptions  Medication Sig Dispense Refill  . Albiglutide (TANZEUM [place]) Inject into the skin once a week.    [name] albuterol (PROAIR HFA) 108 (90 BASE) MCG/ACT inhaler Inhale 2 puffs into the lungs every 6 (six) hours as needed.      . Alpha-D-Galactosidase (BEANO) TABS Take by mouth as needed.     . ANDROGEL PUMP 20.25 MG/ACT (1.62%) GEL 4 pumps daily    . atorvastatin (LIPITOR) 80 MG tablet Take 80 mg by mouth daily.      [name] azelastine (ASTELIN) 137 MCG/SPRAY nasal spray 1 spray by Nasal route daily. Use in each nostril as directed     . B-D ULTRAFINE III SHORT PEN 31G X 8 MM MISC     . CALCIUM-MAGNESIUM PO Take 15 mLs by mouth as needed.      . Canagliflozin (INVOKANA) 100 MG TABS Take 100 mg by mouth daily.    . Cholecalciferol (VITAMIN D3) 1000 UNITS CAPS Take 1 capsule by mouth every other day.     . Chromium Picolinate 1000 MCG TABS Take 1 tablet  by mouth daily.     [name] CITRULLINE PO Take 750 mg by mouth daily.    . Coenzyme Q10 (COQ10) 100 MG CAPS Take 100 mg by mouth daily.     . colchicine 0.6 MG tablet Take 0.6 mg by mouth daily. Alternating 1po every other day and 2 po every other day..    . COLLAGEN PO Take 6 g by mouth daily.      . Cyanocobalamin (VITAMIN B-12) 2500 MCG SUBL Place 1 tablet under the tongue as needed.     . desonide (DESOWEN) 0.05 % cream Apply 1 application topically as needed.      [name] ELIQUIS 5 MG TABS tablet Take 1 tablet by mouth 2 (two) times daily.    . Febuxostat (ULORIC) 80 MG TABS Take by mouth at bedtime.    . fluticasone (FLONASE) 50 MCG/ACT nasal spray as directed.    . Fluticasone-Salmeterol (ADVAIR DISKUS) 250-50 MCG/DOSE AEPB Inhale 1 puff into the lungs every 12 (twelve) hours.      . furosemide (LASIX) 20 MG tablet Take 20 mg by mouth daily.      . hydrochlorothiazide (HYDRODIURIL) 25 MG tablet Take 25 mg by mouth daily.    . hydrocortisone (ANUSOL-HC) 2.5 % rectal cream Place 1 application rectally  2 (two) times daily.      [name] levothyroxine (SYNTHROID, LEVOTHROID) 50 MCG tablet Take 50 mcg by mouth daily.      . Liraglutide (VICTOZA) 18 MG/3ML SOLN Inject 1.8 mLs into the skin daily.      . Loratadine (CLARITIN) 10 MG CAPS Take 1 capsule by mouth daily.      . Melatonin 3 MG TABS Take 1 tablet by mouth at bedtime.      . meloxicam (MOBIC) 15 MG tablet Take 15 mg by mouth as needed.     . metaxalone (SKELAXIN) 800 MG tablet Take 800 mg by mouth 3 (three) times daily as needed.      . metFORMIN (GLUCOPHAGE) 500 MG tablet Take 500 mg by mouth 2 (two) times daily with a meal.      . metoprolol (TOPROL-XL) 100 MG 24 hr tablet Take 100 mg by mouth 2 (two) times daily.      . montelukast (SINGULAIR) 10 MG tablet Take 20 mg by mouth at bedtime.     . NON FORMULARY cinquin  Cinnamon & Chromium    . Nutritional Supplements (PYCNOGENOL PO) Take 50 mg by mouth daily.    . OMEGA-3 KRILL OIL 300 MG CAPS Take  1 capsule by mouth daily.      . Oral Electrolytes (SUSTAIN PO) Take by mouth.    . oxyCODONE (OXY IR/ROXICODONE) 5 MG immediate release tablet Take 2 tablets by mouth At bedtime.    . Pancrelipase, Lip-Prot-Amyl, (CREON) 24000 UNITS CPEP Take by mouth 3 (three) times daily with meals.    . pantoprazole (PROTONIX) 40 MG tablet Take 40 mg by mouth 2 (two) times daily.      . potassium chloride SA (K-DUR,KLOR-CON) 20 MEQ tablet Take 20 mEq by mouth daily.      . Psyllium (HYDROCIL PO) Take 30 grams in 8 oz of water    . quinapril (ACCUPRIL) 40 MG tablet Take 2 at bedtime    . quiNINE (QUALAQUIN) 324 MG capsule Take 324 mg by mouth daily.      . Safflower Oil 1000 MG CAPS Take 1 capsule by mouth as needed.     . Salicylic Acid-Cleanser 6 % (LOTION) KIT Apply topically and occlude area at night 1 kit prn  . Selenium 200 MCG CAPS Take by mouth daily.    . Simethicone (GAS-X PO) Take 180 mg by mouth as needed.      . Taurine 500 MG CAPS Take by mouth daily.    . traMADol (ULTRAM) 50 MG tablet Take 1 tablet by mouth Three times daily as needed.    . Verapamil HCl CR (VERELAN PM) 300 MG CP24 Take 1 capsule by mouth daily.       No current facility-administered medications for this visit.    No Known Allergies  Family History  Problem Relation Age of Onset  . Emphysema Father     died at 59 resp failure  . Heart disease Mother     died at 37 resp failure  . Breast cancer Mother   . Hypertension Sister     History   Social History  . Marital Status: Married    Spouse Name: N/A    Number of Children: N/A  . Years of Education: N/A   Occupational History  . Retired     Scientist, research (life sciences)   Social History Main Topics  . Smoking status: Former Smoker -- 1.00 packs/day for 26 years    Types: Cigarettes  Quit date: 11/20/1982  . Smokeless tobacco: Not on file  . Alcohol Use: Not on file  . Drug Use: Not on file  . Sexual Activity: Not on file   Other Topics Concern  . Not  on file   Social History Narrative    ROS ALL NEGATIVE EXCEPT THOSE NOTED IN HPI  PE Blood pressure 116/66 mmHg, heart rate 40 beats per minute General Appearance: well developed, well nourished in no acute distress, obese HEENT: symmetrical face, PERRLA, good dentition  Neck: no JVD, thyromegaly, or adenopathy, trachea midline Chest: symmetric without deformity Cardiac: PMI non-displaced, irregular rate and rhythm, normal S1, S2, no gallop or murmur Lung: clear to ausculation and percussion Vascular: all pulses full without bruits  Abdominal: nondistended, nontender, good bowel sounds, no HSM, no bruits Extremities: no cyanosis, clubbing or edema, no sign of DVT, no varicosities  Skin: normal color, no rashes Neuro: alert and oriented x 3, non-focal Pysch: normal affect  EKG Chronic atrial fibrillation, rate 40 beats per minute, nonspecific ST segment changes.  LABS: from an outside lab: 08/26/2013 Glucose 212, BUN 19, creatinine 1.4, GFR 49.5, sodium 137, potassium 4.3, AST 20, ALT 17, apolipoprotein B. 61, TSH 2.0, free T4-1 0.0, hemoglobin A1c 7.4, triacylglycerides 107, HDL 28, LDL 54    Assessment and plan:  73 year old gentleman previously followed by Dr. Verl Blalock for chronic A. Fib.  1. Chronic A. Fib - with slow ventricular response, however the patient is physically very active, and asymptomatic. Currently on anticoagulation with Eliquis. The patient was offered lowering the dose of metoprolol succinate  that is currently at 100 mg daily, however he is concerned that his blood pressure would become elevated and he assures me that he is completely asymptomatic and his heart rate was always been low.  2. Hypertension - well controlled on current regimen.  3. Hyperlipidemia - last year results showed Good LDL and TAG, low HDL despite lipitor 80 mg daily and a high exercise level. We will continue the same regimen as the patient doesn't have a h/o CAD. He will send [place] results  in March 2016.  4. DOE - occasional, resolved with discontinuation of NSAIDs. Patient is not interested in stress testing especially since he had completely normal cardiac catheterization just 3 years ago.  Follow up in 1 year.

## 2014-10-23 NOTE — Patient Instructions (Signed)
**Note De-identified Erman Thum Obfuscation** Your physician recommends that you continue on your current medications as directed. Please refer to the Current Medication list given to you today.  Your physician wants you to follow-up in: 1 year. You will receive a reminder letter in the mail two months in advance. If you don't receive a letter, please call our office to schedule the follow-up appointment.  

## 2014-11-10 DIAGNOSIS — H401232 Low-tension glaucoma, bilateral, moderate stage: Secondary | ICD-10-CM | POA: Diagnosis not present

## 2014-11-26 ENCOUNTER — Ambulatory Visit (INDEPENDENT_AMBULATORY_CARE_PROVIDER_SITE_OTHER): Payer: Medicare Other | Admitting: Internal Medicine

## 2014-11-26 ENCOUNTER — Encounter: Payer: Self-pay | Admitting: Internal Medicine

## 2014-11-26 VITALS — BP 126/70 | HR 64 | Ht 68.75 in | Wt 249.0 lb

## 2014-11-26 DIAGNOSIS — R0609 Other forms of dyspnea: Secondary | ICD-10-CM | POA: Diagnosis not present

## 2014-11-26 DIAGNOSIS — J45998 Other asthma: Secondary | ICD-10-CM | POA: Diagnosis not present

## 2014-11-26 DIAGNOSIS — G4733 Obstructive sleep apnea (adult) (pediatric): Secondary | ICD-10-CM

## 2014-11-26 DIAGNOSIS — R0602 Shortness of breath: Secondary | ICD-10-CM

## 2014-11-26 NOTE — Patient Instructions (Addendum)
Office spirometry dx dyspnea, asthma- mild intermittent   Done  We can continue CPAP 13/ [facility]  Ok to use the inhalers when needed  Please call if we can help

## 2014-11-26 NOTE — Progress Notes (Signed)
Subjective:    Patient ID: Colton Brooks, male    DOB: [birth year], 74 y.o.   MRN: [id number]  03/22/11- HPI 47 [name] followed for dyspnea, OSA, allergic rhinitis, asthma, complicated by DM-II, chronic AFib. Last here- September 22, 2010- note reviewed. No more episodes of dyspnea. Got through right TKR with no respiratory problems.  CPAP remains good, but got off it for a bit while recuperating from knee. If he gets up at 4 AM he won't bother putting it back on. Pressure 12. We talked about alternatives including oral appliances. He is interested in learning more about oral appliances for especially during frequent travel.   09/27/11- [name] followed for dyspnea, OSA, allergic rhinitis, asthma, complicated by DM-II, chronic AFib. He has had flu vaccine. He did go to Dr. Ron Parker and was fitted for an oral appliance (TAP) which she uses an instead of CPAP when traveling because it is more portable. He sleeps better with CPAP. If his breathing is bad because of a cold for asthma, he can't wear CPAP because he can't cough into the mask. He has noted more nocturia with his oral appliance and I discussed that as suggesting his blood pressure goes higher during the night when he is wearing it, leading to renal compensation. Now for 4-5 weeks he has had sinus congestion and otitis with hearing loss. He was evaluated by Dr. Johnnette Gourd and given antibiotic by Dr. Brigitte Pulse. He stays on Claritin, nasal spray and daily saline nasal rinse. Because of his atrial fibrillation he avoids Sudafed. He tolerates Advair twice a day for his asthma and uses a rescue inhaler about twice daily. Arthritis limits walking more than his breathing does, especially in cold weather. CPAP is usually comfortable. He asks for a marginal increase from 11.6-12 CWP. Compliance and control are good.  03/26/12- [name] followed for dyspnea, OSA, allergic rhinitis, asthma, complicated by DM-II, chronic AFib. PCP Dr Lang Snow Wears CPAP every night when  home; otherwise uses oral appliance from Dr Oneal Grout when traveling; Recently had sinus infection and cold. CPAP machine/12/[facility] is an old machine. He had sinusitis with some blood in nasal discharge treated by Dr. Redmond Baseman successfully with antibiotic ? Augmentin.  11/26/12- [name] followed for dyspnea, OSA, allergic rhinitis, asthma, complicated by DM-II, chronic AFib. PCP Dr Lang Snow FOLLOWS FOR: wears CPAP12/ AHP every night for about 5-7 hours(as long as he is sleeping); pressure is working well for patient. Doing well with CPAP all night every night Occasionally gets very short of breath for 2 or 3 days until he diureses spontaneously. During these times he will sleep in a chair.  05/27/13- [name] followed for dyspnea, OSA, allergic rhinitis, asthma, complicated by DM-II, chronic AFib. FOLLOWS FOR: 6 month follow up - reports sleeping well.  reports wears CPAP 12/ AHP  99% of the time.  no new supplies needed at this time. Celebrex stopped because it made his chest tight. Activity limited by arthritis pain. Dyspnea associated with deconditioning and overweight. He does remain chronic atrial fibrillation. CXR 03/29/12 IMPRESSION:  1. No acute cardiopulmonary abnormalities.  Original Report Authenticated By: Angelita Ingles, M.D.  11/26/13- [name] followed for dyspnea, OSA, allergic rhinitis, asthma, complicated by DM-II, chronic AFib, gout follows for- wears CPAP 12/ AHP nightly, X5-5.5 hours/night.  Pt denies any problems with his new machine or supplies. Has established with a rheumatologist for his arthritis/gout. Continues CPAP at 12 with good compliance and control, but he would like  to try a small pressure increased for comparison.  11/26/14- 54 [name] former smoker followed for dyspnea, OSA, allergic rhinitis, asthma, complicated by DM-II, chronic AFib, gout CPAP 13/ AHP FOLLOWS FOR:  Wears CPAP 13/ AHP  nightly. Denies problems with mask/pressure. Pt states that his  humidifier is not working as it should be every night. Pt reports drier mouth than normal. Office Spirometry 11/26/14- Mild obstructive airways disease has not been using Advair but carries rescue inhaler still. Wheeze only when he has a cold  Review of Systems-See HPI Constitutional:   No-   weight loss, night sweats, fevers, chills, fatigue, lassitude. HEENT:   No-  headaches, difficulty swallowing, tooth/dental problems, sore throat,       No-  sneezing, itching, ear ache,  no  -nasal congestion, post nasal drip,  CV:  No-   chest pain, orthopnea, PND, swelling in lower extremities, anasarca, dizziness, palpitations Resp: +shortness of breath with exertion.              No-   productive cough,  No non-productive cough,  No- coughing up of blood.              No-   change in color of mucus.  No- wheezing.   Skin: No-   rash or lesions. GI:  No-   heartburn, indigestion, abdominal pain, nausea, vomiting,  GU: No-   dysuria,  MS:  +Chronic joint pain or swelling.   Neuro-     nothing unusual Psych:  No- change in mood or affect. No depression or anxiety.  No memory loss.   Objective:   Physical Exam  General- Alert, Oriented, Affect-appropriate, Distress- none acute; overweight Skin- rash-none, lesions- none, excoriation- none Lymphadenopathy- none Head- atraumatic            Eyes- Gross vision intact, PERRLA, conjunctivae clear secretions            Ears- Hearing, canals-normal            Nose- Clear, no-Septal dev, mucus, polyps, erosion, perforation             Throat- Mallampati III-IV , mucosa clear , drainage- none, tonsils- atrophic Neck- flexible , trachea midline, no stridor , thyroid nl, carotid no bruit Chest - symmetrical excursion , unlabored           Heart/CV- +nearly regular pulse/ Atrial fib , no murmur , no gallop  , no rub, nl s1 s2                           - JVD- none , edema-+2, stasis changes-+, varices- none           Lung- clear to P&A, wheeze- none, cough-  none , dullness-none, rub- none           Chest wall-  Abd-  Br/ Gen/ Rectal- Not done, not indicated Extrem- cyanosis- none, clubbing, none, atrophy- none, strength- nl, +cane Neuro- grossly intact to observation

## 2015-01-07 DIAGNOSIS — E785 Hyperlipidemia, unspecified: Secondary | ICD-10-CM | POA: Diagnosis not present

## 2015-01-07 DIAGNOSIS — N401 Enlarged prostate with lower urinary tract symptoms: Secondary | ICD-10-CM | POA: Diagnosis not present

## 2015-01-07 DIAGNOSIS — I480 Paroxysmal atrial fibrillation: Secondary | ICD-10-CM | POA: Diagnosis not present

## 2015-01-07 DIAGNOSIS — Z6833 Body mass index (BMI) 33.0-33.9, adult: Secondary | ICD-10-CM | POA: Diagnosis not present

## 2015-01-07 DIAGNOSIS — I10 Essential (primary) hypertension: Secondary | ICD-10-CM | POA: Diagnosis not present

## 2015-01-07 DIAGNOSIS — E1129 Type 2 diabetes mellitus with other diabetic kidney complication: Secondary | ICD-10-CM | POA: Diagnosis not present

## 2015-01-07 DIAGNOSIS — E669 Obesity, unspecified: Secondary | ICD-10-CM | POA: Diagnosis not present

## 2015-01-10 NOTE — Assessment & Plan Note (Signed)
We discussed communication with his home care company about humidifier and supplies for his CPAP. Compliance and control her good. CPAP remains medically necessary.

## 2015-01-10 NOTE — Assessment & Plan Note (Signed)
Most of his dyspnea is from any cardiac component and his obesity with deconditioning

## 2015-04-06 DIAGNOSIS — I480 Paroxysmal atrial fibrillation: Secondary | ICD-10-CM | POA: Diagnosis not present

## 2015-04-06 DIAGNOSIS — Z6833 Body mass index (BMI) 33.0-33.9, adult: Secondary | ICD-10-CM | POA: Diagnosis not present

## 2015-04-06 DIAGNOSIS — H401232 Low-tension glaucoma, bilateral, moderate stage: Secondary | ICD-10-CM | POA: Diagnosis not present

## 2015-04-06 DIAGNOSIS — H8110 Benign paroxysmal vertigo, unspecified ear: Secondary | ICD-10-CM | POA: Diagnosis not present

## 2015-04-06 DIAGNOSIS — I10 Essential (primary) hypertension: Secondary | ICD-10-CM | POA: Diagnosis not present

## 2015-04-28 DIAGNOSIS — M150 Primary generalized (osteo)arthritis: Secondary | ICD-10-CM | POA: Diagnosis not present

## 2015-04-28 DIAGNOSIS — M1A09X0 Idiopathic chronic gout, multiple sites, without tophus (tophi): Secondary | ICD-10-CM | POA: Diagnosis not present

## 2015-05-12 DIAGNOSIS — H401232 Low-tension glaucoma, bilateral, moderate stage: Secondary | ICD-10-CM | POA: Diagnosis not present

## 2015-05-13 DIAGNOSIS — Z125 Encounter for screening for malignant neoplasm of prostate: Secondary | ICD-10-CM | POA: Diagnosis not present

## 2015-05-13 DIAGNOSIS — E1129 Type 2 diabetes mellitus with other diabetic kidney complication: Secondary | ICD-10-CM | POA: Diagnosis not present

## 2015-05-13 DIAGNOSIS — E785 Hyperlipidemia, unspecified: Secondary | ICD-10-CM | POA: Diagnosis not present

## 2015-05-13 DIAGNOSIS — E291 Testicular hypofunction: Secondary | ICD-10-CM | POA: Diagnosis not present

## 2015-05-13 DIAGNOSIS — N390 Urinary tract infection, site not specified: Secondary | ICD-10-CM | POA: Diagnosis not present

## 2015-05-13 DIAGNOSIS — M109 Gout, unspecified: Secondary | ICD-10-CM | POA: Diagnosis not present

## 2015-05-13 DIAGNOSIS — I10 Essential (primary) hypertension: Secondary | ICD-10-CM | POA: Diagnosis not present

## 2015-05-19 DIAGNOSIS — I480 Paroxysmal atrial fibrillation: Secondary | ICD-10-CM | POA: Diagnosis not present

## 2015-05-19 DIAGNOSIS — M109 Gout, unspecified: Secondary | ICD-10-CM | POA: Diagnosis not present

## 2015-05-19 DIAGNOSIS — Z0000 Encounter for general adult medical examination without abnormal findings: Secondary | ICD-10-CM | POA: Diagnosis not present

## 2015-05-19 DIAGNOSIS — Z6834 Body mass index (BMI) 34.0-34.9, adult: Secondary | ICD-10-CM | POA: Diagnosis not present

## 2015-05-19 DIAGNOSIS — Z1389 Encounter for screening for other disorder: Secondary | ICD-10-CM | POA: Diagnosis not present

## 2015-05-19 DIAGNOSIS — F329 Major depressive disorder, single episode, unspecified: Secondary | ICD-10-CM | POA: Diagnosis not present

## 2015-05-19 DIAGNOSIS — I10 Essential (primary) hypertension: Secondary | ICD-10-CM | POA: Diagnosis not present

## 2015-05-19 DIAGNOSIS — E1129 Type 2 diabetes mellitus with other diabetic kidney complication: Secondary | ICD-10-CM | POA: Diagnosis not present

## 2015-05-19 DIAGNOSIS — G4733 Obstructive sleep apnea (adult) (pediatric): Secondary | ICD-10-CM | POA: Diagnosis not present

## 2015-05-19 DIAGNOSIS — J45909 Unspecified asthma, uncomplicated: Secondary | ICD-10-CM | POA: Diagnosis not present

## 2015-05-19 DIAGNOSIS — Z7901 Long term (current) use of anticoagulants: Secondary | ICD-10-CM | POA: Diagnosis not present

## 2015-05-19 DIAGNOSIS — E785 Hyperlipidemia, unspecified: Secondary | ICD-10-CM | POA: Diagnosis not present

## 2015-05-25 ENCOUNTER — Encounter: Payer: Self-pay | Admitting: Cardiology

## 2015-05-28 DIAGNOSIS — H401232 Low-tension glaucoma, bilateral, moderate stage: Secondary | ICD-10-CM | POA: Diagnosis not present

## 2015-07-13 DIAGNOSIS — R112 Nausea with vomiting, unspecified: Secondary | ICD-10-CM | POA: Diagnosis not present

## 2015-07-13 DIAGNOSIS — R197 Diarrhea, unspecified: Secondary | ICD-10-CM | POA: Diagnosis not present

## 2015-07-13 DIAGNOSIS — Z6834 Body mass index (BMI) 34.0-34.9, adult: Secondary | ICD-10-CM | POA: Diagnosis not present

## 2015-07-13 DIAGNOSIS — L299 Pruritus, unspecified: Secondary | ICD-10-CM | POA: Diagnosis not present

## 2015-07-13 DIAGNOSIS — E1129 Type 2 diabetes mellitus with other diabetic kidney complication: Secondary | ICD-10-CM | POA: Diagnosis not present

## 2015-07-28 DIAGNOSIS — H401232 Low-tension glaucoma, bilateral, moderate stage: Secondary | ICD-10-CM | POA: Diagnosis not present

## 2015-08-03 DIAGNOSIS — D485 Neoplasm of uncertain behavior of skin: Secondary | ICD-10-CM | POA: Diagnosis not present

## 2015-08-03 DIAGNOSIS — D0421 Carcinoma in situ of skin of right ear and external auricular canal: Secondary | ICD-10-CM | POA: Diagnosis not present

## 2015-09-15 DIAGNOSIS — L0101 Non-bullous impetigo: Secondary | ICD-10-CM | POA: Diagnosis not present

## 2015-09-20 DIAGNOSIS — Z6831 Body mass index (BMI) 31.0-31.9, adult: Secondary | ICD-10-CM | POA: Diagnosis not present

## 2015-09-20 DIAGNOSIS — Z23 Encounter for immunization: Secondary | ICD-10-CM | POA: Diagnosis not present

## 2015-09-20 DIAGNOSIS — Z7901 Long term (current) use of anticoagulants: Secondary | ICD-10-CM | POA: Diagnosis not present

## 2015-09-20 DIAGNOSIS — R110 Nausea: Secondary | ICD-10-CM | POA: Diagnosis not present

## 2015-09-20 DIAGNOSIS — E1129 Type 2 diabetes mellitus with other diabetic kidney complication: Secondary | ICD-10-CM | POA: Diagnosis not present

## 2015-09-20 DIAGNOSIS — K429 Umbilical hernia without obstruction or gangrene: Secondary | ICD-10-CM | POA: Diagnosis not present

## 2015-09-20 DIAGNOSIS — Z1389 Encounter for screening for other disorder: Secondary | ICD-10-CM | POA: Diagnosis not present

## 2015-09-20 DIAGNOSIS — E785 Hyperlipidemia, unspecified: Secondary | ICD-10-CM | POA: Diagnosis not present

## 2015-09-20 DIAGNOSIS — C801 Malignant (primary) neoplasm, unspecified: Secondary | ICD-10-CM | POA: Diagnosis not present

## 2015-09-20 DIAGNOSIS — I480 Paroxysmal atrial fibrillation: Secondary | ICD-10-CM | POA: Diagnosis not present

## 2015-09-20 DIAGNOSIS — I10 Essential (primary) hypertension: Secondary | ICD-10-CM | POA: Diagnosis not present

## 2015-10-05 ENCOUNTER — Other Ambulatory Visit: Payer: Self-pay | Admitting: Surgery

## 2015-10-05 DIAGNOSIS — K429 Umbilical hernia without obstruction or gangrene: Secondary | ICD-10-CM | POA: Diagnosis not present

## 2015-10-19 DIAGNOSIS — E119 Type 2 diabetes mellitus without complications: Secondary | ICD-10-CM | POA: Diagnosis not present

## 2015-10-19 DIAGNOSIS — H35362 Drusen (degenerative) of macula, left eye: Secondary | ICD-10-CM | POA: Diagnosis not present

## 2015-10-19 DIAGNOSIS — H401232 Low-tension glaucoma, bilateral, moderate stage: Secondary | ICD-10-CM | POA: Diagnosis not present

## 2015-10-19 DIAGNOSIS — H43813 Vitreous degeneration, bilateral: Secondary | ICD-10-CM | POA: Diagnosis not present

## 2015-11-05 ENCOUNTER — Encounter: Payer: Self-pay | Admitting: Cardiology

## 2015-11-05 ENCOUNTER — Ambulatory Visit (INDEPENDENT_AMBULATORY_CARE_PROVIDER_SITE_OTHER): Payer: Medicare Other | Admitting: Cardiology

## 2015-11-05 VITALS — BP 122/82 | HR 57 | Ht 68.0 in | Wt 232.0 lb

## 2015-11-05 DIAGNOSIS — I10 Essential (primary) hypertension: Secondary | ICD-10-CM | POA: Diagnosis not present

## 2015-11-05 DIAGNOSIS — I481 Persistent atrial fibrillation: Secondary | ICD-10-CM

## 2015-11-05 DIAGNOSIS — I509 Heart failure, unspecified: Secondary | ICD-10-CM

## 2015-11-05 DIAGNOSIS — E785 Hyperlipidemia, unspecified: Secondary | ICD-10-CM

## 2015-11-05 DIAGNOSIS — I4819 Other persistent atrial fibrillation: Secondary | ICD-10-CM

## 2015-11-05 DIAGNOSIS — R0609 Other forms of dyspnea: Secondary | ICD-10-CM | POA: Diagnosis not present

## 2015-11-05 DIAGNOSIS — I5022 Chronic systolic (congestive) heart failure: Secondary | ICD-10-CM | POA: Insufficient documentation

## 2015-11-05 NOTE — Progress Notes (Signed)
Patient ID: Colton Brooks, male   DOB: 12-27-1940, 74 y.o.   MRN: [id number]     HPI Colton Brooks returns today for evaluation and management of his chronic A. fib and anticoagulation. He has chronic bradycardia but denies any dizziness or syncope. He states that he only would feel dizzy when he stands up fast. He otherwise denies any chest pain. He had cardiac catheterization about 3 years ago that was completely normal. He admits to occasional dyspnea on exertion , but not very active. He feels very well and is compliant with all his meds. He's had no bleeding. He is followed for hyperlipidemia by his primary care physician. Denies LE edema, orthopnea, PND, palpitations or syncope, no bleeding.  Past Medical History  Diagnosis Date  . Allergic rhinitis, cause unspecified   . Asthma   . AF (atrial fibrillation) ([place])   . OSA on CPAP   . Type II or unspecified type diabetes mellitus without mention of complication, not stated as uncontrolled     Current Outpatient Prescriptions  Medication Sig Dispense Refill  . Albiglutide (TANZEUM [place]) Inject into the skin once a week.    [name] albuterol (PROAIR HFA) 108 (90 BASE) MCG/ACT inhaler Inhale 2 puffs into the lungs every 6 (six) hours as needed.      . Alpha-D-Galactosidase (BEANO) TABS Take by mouth as needed.     . ANDROGEL PUMP 20.25 MG/ACT (1.62%) GEL 4 pumps daily    . atorvastatin (LIPITOR) 80 MG tablet Take 80 mg by mouth daily.      [name] azelastine (ASTELIN) 137 MCG/SPRAY nasal spray 1 spray by Nasal route daily. Use in each nostril as directed     . B-D ULTRAFINE III SHORT PEN 31G X 8 MM MISC     . CALCIUM-MAGNESIUM PO Take 15 mLs by mouth as needed.      . Canagliflozin (INVOKANA) 100 MG TABS Take 100 mg by mouth daily.    . Cholecalciferol (VITAMIN D3) 1000 UNITS CAPS Take 1 capsule by mouth every other day.     . Chromium Picolinate 1000 MCG TABS Take 1 tablet by mouth daily.     [name] CITRULLINE PO Take 750 mg by mouth daily.    .  Coenzyme Q10 (COQ10) 100 MG CAPS Take 100 mg by mouth daily.     . colchicine 0.6 MG tablet Take 0.6 mg by mouth daily. Alternating 1po every other day and 2 po every other day..    . COLLAGEN PO Take 6 g by mouth daily.      . Cyanocobalamin (VITAMIN B-12) 2500 MCG SUBL Place 1 tablet under the tongue as needed.     . desonide (DESOWEN) 0.05 % cream Apply 1 application topically as needed.      [name] ELIQUIS 5 MG TABS tablet Take 1 tablet by mouth 2 (two) times daily.    . Febuxostat (ULORIC) 80 MG TABS Take by mouth at bedtime.    . fluticasone (FLONASE) 50 MCG/ACT nasal spray as directed.    . Fluticasone-Salmeterol (ADVAIR DISKUS) 250-50 MCG/DOSE AEPB Inhale 1 puff into the lungs every 12 (twelve) hours.      . furosemide (LASIX) 20 MG tablet Take 20 mg by mouth daily.      . hydrochlorothiazide (HYDRODIURIL) 25 MG tablet Take 25 mg by mouth daily.    . hydrocortisone (ANUSOL-HC) 2.5 % rectal cream Place 1 application rectally 2 (two) times daily.      [name] levothyroxine (SYNTHROID, LEVOTHROID) 50 MCG  tablet Take 50 mcg by mouth daily.      . Liraglutide (VICTOZA) 18 MG/3ML SOLN Inject 1.8 mLs into the skin daily.      . Loratadine (CLARITIN) 10 MG CAPS Take 1 capsule by mouth daily.      [name] LUMIGAN 0.01 % SOLN Place 1 drop into both eyes at bedtime.    . Melatonin 3 MG TABS Take 1 tablet by mouth at bedtime.      . meloxicam (MOBIC) 15 MG tablet Take 15 mg by mouth as needed.     . metaxalone (SKELAXIN) 800 MG tablet Take 800 mg by mouth 3 (three) times daily as needed.      . metFORMIN (GLUCOPHAGE) 500 MG tablet Take 500 mg by mouth 2 (two) times daily with a meal.      . metoprolol (TOPROL-XL) 100 MG 24 hr tablet Take 100 mg by mouth 2 (two) times daily.      . montelukast (SINGULAIR) 10 MG tablet Take 20 mg by mouth at bedtime.     . NON FORMULARY cinquin  Cinnamon & Chromium    . Nutritional Supplements (PYCNOGENOL PO) Take 50 mg by mouth daily.    . OMEGA-3 KRILL OIL 300 MG CAPS Take 1  capsule by mouth daily.      . Oral Electrolytes (SUSTAIN PO) Take by mouth.    . oxyCODONE (OXY IR/ROXICODONE) 5 MG immediate release tablet Take 2 tablets by mouth At bedtime.    . Pancrelipase, Lip-Prot-Amyl, (CREON) 24000 UNITS CPEP Take by mouth 3 (three) times daily with meals.    . pantoprazole (PROTONIX) 40 MG tablet Take 40 mg by mouth 2 (two) times daily.      . potassium chloride SA (K-DUR,KLOR-CON) 20 MEQ tablet Take 20 mEq by mouth daily.      . Psyllium (HYDROCIL PO) Take 30 grams in 8 oz of water    . quinapril (ACCUPRIL) 40 MG tablet Take 2 at bedtime    . quiNINE (QUALAQUIN) 324 MG capsule Take 324 mg by mouth daily.      . Safflower Oil 1000 MG CAPS Take 1 capsule by mouth as needed.     . Salicylic Acid-Cleanser 6 % (LOTION) KIT Apply topically and occlude area at night 1 kit prn  . Selenium 200 MCG CAPS Take by mouth daily.    . Simethicone (GAS-X PO) Take 180 mg by mouth as needed.      [name] TANZEUM 50 MG PEN as directed.    . Taurine 500 MG CAPS Take by mouth daily.    . traMADol (ULTRAM) 50 MG tablet Take 1 tablet by mouth Three times daily as needed.    . Verapamil HCl CR (VERELAN PM) 300 MG CP24 Take 1 capsule by mouth daily.       No current facility-administered medications for this visit.    No Known Allergies  Family History  Problem Relation Age of Onset  . Emphysema Father     died at 68 resp failure  . Heart disease Mother     died at 40 resp failure  . Breast cancer Mother   . Hypertension Sister     Social History   Social History  . Marital Status: Married    Spouse Name: N/A  . Number of Children: N/A  . Years of Education: N/A   Occupational History  . Retired     Scientist, research (life sciences)   Social History Main Topics  . Smoking status: Former Smoker --  1.00 packs/day for 26 years    Types: Cigarettes    Quit date: 11/20/1982  . Smokeless tobacco: Not on file  . Alcohol Use: Not on file  . Drug Use: Not on file  . Sexual Activity: Not  on file   Other Topics Concern  . Not on file   Social History Narrative    ROS ALL NEGATIVE EXCEPT THOSE NOTED IN HPI  PE Blood pressure 116/66 mmHg, heart rate 40 beats per minute General Appearance: well developed, well nourished in no acute distress, obese HEENT: symmetrical face, PERRLA, good dentition  Neck: no JVD, thyromegaly, or adenopathy, trachea midline Chest: symmetric without deformity Cardiac: PMI non-displaced, irregular rate and rhythm, normal S1, S2, no gallop or murmur Lung: clear to ausculation and percussion Vascular: all pulses full without bruits  Abdominal: nondistended, nontender, good bowel sounds, no HSM, no bruits Extremities: no cyanosis, clubbing or edema, no sign of DVT, no varicosities  Skin: normal color, no rashes Neuro: alert and oriented x 3, non-focal Pysch: normal affect  EKG Chronic atrial fibrillation, rate 40 beats per minute, nonspecific ST segment changes.  LABS: from an outside lab: 08/26/2013 Glucose 212, BUN 19, creatinine 1.4, GFR 49.5, sodium 137, potassium 4.3, AST 20, ALT 17, apolipoprotein B. 61, TSH 2.0, free T4-1 0.0, hemoglobin A1c 7.4, triacylglycerides 107, HDL 28, LDL 54    Assessment and plan:  74 year old gentleman   1. Chronic A. Fib - with slow ventricular response, however the patient is physically very active, and asymptomatic. Currently on anticoagulation with Eliquis. No bleeding.  2. Hypertension - well controlled on current regimen.  3. Hyperlipidemia - last year results showed Good LDL and TAG, low HDL despite lipitor 80 mg daily and a high exercise level. We will continue the same regimen as the patient doesn't have a h/o CAD. He will send [place] results in March 2016.  4. DOE - completely normal cardiac catheterization just 3 years ago, we will order an echocardiogram to evaluate for systolic and diastolic function.   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

## 2015-11-05 NOTE — Patient Instructions (Signed)
Medication Instructions:   Your physician recommends that you continue on your current medications as directed. Please refer to the Current Medication list given to you today.    Testing/Procedures:  Your physician has requested that you have an echocardiogram. Echocardiography is a painless test that uses sound waves to create images of your heart. It provides your doctor with information about the size and shape of your heart and how well your heart's chambers and valves are working. This procedure takes approximately one hour. There are no restrictions for this procedure.     Follow-Up:  Your physician wants you to follow-up in: ONE YEAR WITH DR NELSON You will receive a reminder letter in the mail two months in advance. If you don't receive a letter, please call our office to schedule the follow-up appointment.        If you need a refill on your cardiac medications before your next appointment, please call your pharmacy.   

## 2015-11-25 ENCOUNTER — Encounter (HOSPITAL_BASED_OUTPATIENT_CLINIC_OR_DEPARTMENT_OTHER)
Admission: RE | Admit: 2015-11-25 | Discharge: 2015-11-25 | Disposition: A | Discharge status: Home / Self Care | Payer: Medicare Other | Source: Ambulatory Visit | Attending: Surgery | Admitting: Surgery

## 2015-11-25 ENCOUNTER — Encounter (HOSPITAL_BASED_OUTPATIENT_CLINIC_OR_DEPARTMENT_OTHER): Payer: Self-pay | Admitting: *Deleted

## 2015-11-25 DIAGNOSIS — K219 Gastro-esophageal reflux disease without esophagitis: Secondary | ICD-10-CM | POA: Diagnosis not present

## 2015-11-25 DIAGNOSIS — E119 Type 2 diabetes mellitus without complications: Secondary | ICD-10-CM | POA: Diagnosis not present

## 2015-11-25 DIAGNOSIS — I4891 Unspecified atrial fibrillation: Secondary | ICD-10-CM | POA: Diagnosis not present

## 2015-11-25 DIAGNOSIS — Z7984 Long term (current) use of oral hypoglycemic drugs: Secondary | ICD-10-CM | POA: Diagnosis not present

## 2015-11-25 DIAGNOSIS — E039 Hypothyroidism, unspecified: Secondary | ICD-10-CM | POA: Diagnosis not present

## 2015-11-25 DIAGNOSIS — Z7901 Long term (current) use of anticoagulants: Secondary | ICD-10-CM | POA: Diagnosis not present

## 2015-11-25 DIAGNOSIS — Z6833 Body mass index (BMI) 33.0-33.9, adult: Secondary | ICD-10-CM | POA: Diagnosis not present

## 2015-11-25 DIAGNOSIS — E669 Obesity, unspecified: Secondary | ICD-10-CM | POA: Diagnosis not present

## 2015-11-25 DIAGNOSIS — J45909 Unspecified asthma, uncomplicated: Secondary | ICD-10-CM | POA: Diagnosis not present

## 2015-11-25 DIAGNOSIS — Z87891 Personal history of nicotine dependence: Secondary | ICD-10-CM | POA: Diagnosis not present

## 2015-11-25 DIAGNOSIS — K429 Umbilical hernia without obstruction or gangrene: Secondary | ICD-10-CM | POA: Diagnosis not present

## 2015-11-25 DIAGNOSIS — G4730 Sleep apnea, unspecified: Secondary | ICD-10-CM | POA: Diagnosis not present

## 2015-11-25 DIAGNOSIS — I10 Essential (primary) hypertension: Secondary | ICD-10-CM | POA: Diagnosis not present

## 2015-11-25 DIAGNOSIS — Z79891 Long term (current) use of opiate analgesic: Secondary | ICD-10-CM | POA: Diagnosis not present

## 2015-11-25 LAB — BASIC METABOLIC PANEL
Anion gap: 14 (ref 5–15)
BUN: 18 mg/dL (ref 6–20)
CHLORIDE: 104 mmol/L (ref 101–111)
CO2: 21 mmol/L — ABNORMAL LOW (ref 22–32)
CREATININE: 1.37 mg/dL — AB (ref 0.61–1.24)
Calcium: 9.3 mg/dL (ref 8.9–10.3)
GFR calc Af Amer: 56 mL/min — ABNORMAL LOW (ref >60)
GFR calc non Af Amer: 49 mL/min — ABNORMAL LOW (ref >60)
Glucose, Bld: 186 mg/dL — ABNORMAL HIGH (ref 65–99)
Potassium: 3.8 mmol/L (ref 3.5–5.1)
SODIUM: 139 mmol/L (ref 135–145)

## 2015-11-25 NOTE — Pre-Procedure Instructions (Signed)
Cardiology notes from Dr. Meda Coffee, EKG, med list and other pmh reviewed by Dr. Marcie Bal with anesthesia. OK for surgery on Monday. PT stopping Eliquis today per MD note. To come in today for BMET.

## 2015-11-26 ENCOUNTER — Encounter: Payer: Self-pay | Admitting: Cardiology

## 2015-11-26 ENCOUNTER — Other Ambulatory Visit: Payer: Self-pay

## 2015-11-26 ENCOUNTER — Ambulatory Visit (HOSPITAL_BASED_OUTPATIENT_CLINIC_OR_DEPARTMENT_OTHER): Payer: Medicare Other

## 2015-11-26 DIAGNOSIS — I10 Essential (primary) hypertension: Secondary | ICD-10-CM

## 2015-11-26 DIAGNOSIS — E119 Type 2 diabetes mellitus without complications: Secondary | ICD-10-CM | POA: Diagnosis not present

## 2015-11-26 DIAGNOSIS — E785 Hyperlipidemia, unspecified: Secondary | ICD-10-CM | POA: Diagnosis not present

## 2015-11-26 DIAGNOSIS — K219 Gastro-esophageal reflux disease without esophagitis: Secondary | ICD-10-CM | POA: Diagnosis not present

## 2015-11-26 DIAGNOSIS — K429 Umbilical hernia without obstruction or gangrene: Secondary | ICD-10-CM | POA: Diagnosis not present

## 2015-11-26 DIAGNOSIS — I509 Heart failure, unspecified: Secondary | ICD-10-CM

## 2015-11-26 DIAGNOSIS — J45909 Unspecified asthma, uncomplicated: Secondary | ICD-10-CM | POA: Diagnosis not present

## 2015-11-26 DIAGNOSIS — I4891 Unspecified atrial fibrillation: Secondary | ICD-10-CM | POA: Diagnosis not present

## 2015-11-28 NOTE — H&P (Signed)
Colton Brooks  Location: [facility] Patient #: [id number] DOB: [birth year] Married / Language: English / Race: White Male   History of Present Illness  Patient words: New-umb hernia.  The patient is a 75 year old male who presents for an evaluation of a hernia. This is a pleasant gentleman referred to me by Dr. Marton Redwood for evaluation of a symptomatic umbilical hernia. The patient has multiple chronic medical problems. He has had the hernia for many years but is now getting larger and causing discomfort around the umbilicus. He has had no obstructive symptoms. He is otherwise without complaints. The pain is described as a sharp burning and is moderate in intensity. He has no pain currently today   Other Problems  Arthritis Asthma Atrial Fibrillation Back Pain Bladder Problems Diabetes Mellitus Gastroesophageal Reflux Disease Hemorrhoids High blood pressure Kidney Stone Melanoma Sleep Apnea Thyroid Disease Umbilical Hernia Repair  Past Surgical History  Cataract Surgery Bilateral. Knee Surgery Right. Oral Surgery Tonsillectomy  Diagnostic Studies History Malachi Bonds, CMA;  Colonoscopy 1-5 years ago  Medication History (Chemira Jones, CMA;  QuiNINE Sulfate (324MG  Capsule, Oral) Active. Flomax (0.4MG  Capsule, Oral) Active. Lumigan (0.01% Solution, Ophthalmic) Active. Invokana (100MG  Tablet, Oral) Active. Eliquis (5MG  Tablet, Oral) Active. Vitamin D3 (1000UNIT Capsule, Oral) Active. Turmeric (500MG  Capsule, Oral) Active. Taurine (500MG  Capsule, Oral) Active. Simethicone (180MG  Capsule, Oral) Active. Milk Thistle (175MG  Capsule, Oral) Active. L-Citrulline (600MG  Capsule, Oral) Active. Beano (Oral) Active. Uloric (80MG  Tablet, Oral) Active. Voltaren (1% Gel, External) Active. Accupril (40MG  Tablet, Oral) Active. AndroGel Pump (20.25 MG/ACT(1.62%) Gel, Transdermal) Active. Desonide (0.05% Cream,  External) Active. Furosemide (20MG  Tablet, Oral) Active. HydroCHLOROthiazide (25MG  Tablet, Oral) Active. Klor-Con M20 [facility] Tablet ER, Oral) Active. Lipitor (80MG  Tablet, Oral) Active. MetFORMIN HCl (1000MG  Tablet, Oral) Active. Protonix (40MG  Tablet DR, Oral) Active. Qualaquin (324MG  Capsule, Oral) Active. Skelaxin (800MG  Tablet, Oral) Active. Singulair (10MG  Tablet, Oral) Active. Verelan PM (300MG  Capsule ER 24HR, Oral) Active. Collagen (500MG  Capsule, Oral) Active. Flonase (50MCG/ACT Suspension, Nasal) Active. Azelastine & Fluticasone (137 & 50MCG/ACT Therapy Pack, Nasal) Active. Synthroid (50MCG Tablet, Oral) Active. CoQ10 (200MG  Capsule, Oral) Active. Krill Oil (300MG  Capsule, Oral) Active. Vitamin B12 (1000MCG Tablet ER, Oral) Active. Claritin (10MG  Tablet, Oral) Active. ProAir HFA (108 (90 Base)MCG/ACT Aerosol Soln, Inhalation) Active. Colcrys (0.6MG  Tablet, Oral) Active. Metoprolol Succinate ER (100MG  Tablet ER 24HR, Oral) Active. Creon (24000UNIT Capsule DR Part, Oral) Active. Advair Diskus (250-50MCG/DOSE Aero Pow Br Act, Inhalation) Active. Safflower Oil Active. OxyCODONE HCl (5MG  Tablet, Oral) Active.  Social History Malachi Bonds, CMA;  Alcohol use Occasional alcohol use. Caffeine use Carbonated beverages, Coffee, Tea. No drug use Tobacco use Former smoker.  Family History Malachi Bonds, CMA; Arthritis Father, Mother. Breast Cancer Mother. Cancer Sister. Diabetes Mellitus Father, Mother. Heart Disease Father. Kidney Disease Sister. Respiratory Condition Father.    Review of Systems Malachi Bonds CMA;  General Present- Fatigue and Weight Gain. Not Present- Appetite Loss, Chills, Fever, Night Sweats and Weight Loss. Skin Present- Dryness. Not Present- Change in Wart/Mole, Hives, Jaundice, New Lesions, Non-Healing Wounds, Rash and Ulcer. HEENT Present- Hearing Loss, Hoarseness, Seasonal Allergies and Wears  glasses/contact lenses. Not Present- Earache, Nose Bleed, Oral Ulcers, Ringing in the Ears, Sinus Pain, Sore Throat, Visual Disturbances and Yellow Eyes. Respiratory Present- Snoring. Not Present- Bloody sputum, Chronic Cough, Difficulty Breathing and Wheezing. Breast Not Present- Breast Mass, Breast Pain, Nipple Discharge and Skin Changes. Cardiovascular Present- Difficulty Breathing Lying Down, Leg Cramps, Shortness of Breath and Swelling of Extremities. Not  Present- Chest Pain, Palpitations and Rapid Heart Rate. Gastrointestinal Present- Abdominal Pain, Bloating, Bloody Stool, Gets full quickly at meals, Hemorrhoids, Indigestion, Nausea and Rectal Pain. Not Present- Change in Bowel Habits, Chronic diarrhea, Constipation, Difficulty Swallowing, Excessive gas and Vomiting. Male Genitourinary Present- Change in Urinary Stream, Frequency, Impotence, Nocturia, Urgency and Urine Leakage. Not Present- Blood in Urine and Painful Urination. Musculoskeletal Present- Back Pain, Joint Pain, Joint Stiffness and Muscle Weakness. Not Present- Muscle Pain and Swelling of Extremities. Neurological Present- Decreased Memory and Weakness. Not Present- Fainting, Headaches, Numbness, Seizures, Tingling, Tremor and Trouble walking. Psychiatric Not Present- Anxiety, Bipolar, Change in Sleep Pattern, Depression, Fearful and Frequent crying. Endocrine Present- Hot flashes. Not Present- Cold Intolerance, Excessive Hunger, Hair Changes, Heat Intolerance and New Diabetes. Hematology Present- Easy Bruising. Not Present- Excessive bleeding, Gland problems, HIV and Persistent Infections.  Vitals (Chemira Jones CMA; 10/05/2015 2:15 PM) 10/05/2015 2:15 PM Weight: 226 lb Height: 69in Body Surface Area: 2.18 m Body Mass Index: 33.37 kg/m  Temp.: 98.50F(Oral)  BP: 130/70 (Sitting, Left Arm, Standard)       Physical Exam (Petar Mucci A. Ninfa Linden MD;  General Mental Status-Alert. General Appearance-Consistent  with stated age. Hydration-Well hydrated. Voice-Normal.  Head and Neck Head-normocephalic, atraumatic with no lesions or palpable masses. Trachea-midline.  Eye Eyeball - Bilateral-Extraocular movements intact. Sclera/Conjunctiva - Bilateral-No scleral icterus.  Chest and Lung Exam Chest and lung exam reveals -quiet, even and easy respiratory effort with no use of accessory muscles and on auscultation, normal breath sounds, no adventitious sounds and normal vocal resonance. Inspection Chest Wall - Normal. Back - normal.  Cardiovascular Cardiovascular examination reveals -normal pedal pulses bilaterally. Auscultation Rhythm - Regularly irregular.  Abdomen Inspection Skin - Scar - no surgical scars. Hernias - Umbilical hernia - Reducible. Inguinal hernia - Left - Reducible. Palpation/Percussion Palpation and Percussion of the abdomen reveal - Soft, Non Tender, No Rebound tenderness, No Rigidity (guarding) and No hepatosplenomegaly. Auscultation Auscultation of the abdomen reveals - Bowel sounds normal.  Neurologic Neurologic evaluation reveals -alert and oriented x 3 with no impairment of recent or remote memory. Mental Status-Normal.  Musculoskeletal Normal Exam - Left-Upper Extremity Strength Normal and Lower Extremity Strength Normal. Normal Exam - Right-Upper Extremity Strength Normal, Lower Extremity Weakness.    Assessment & Plan (Sun Kihn A. Ninfa Linden MD;  UMBILICAL HERNIA ([id number])  Impression: I discussed the diagnosis with the patient and his wife. Umbilical hernia repair with probable mesh is recommended. I discussed the surgery with him in detail and gave him literature regarding surgery. I discussed the risks of surgery which includes but is not limited to bleeding, infection, injury to surrounding structures, recurrence, postoperative recovery, etc. He will need to stop his blood thinning medication 2 days preoperatively and I will started  back the day after surgery. He understands and wished to proceed with surgery which will be scheduled    Signed by Harl Bowie, MD

## 2015-11-29 ENCOUNTER — Encounter (HOSPITAL_BASED_OUTPATIENT_CLINIC_OR_DEPARTMENT_OTHER): Payer: Self-pay | Admitting: Anesthesiology

## 2015-11-29 ENCOUNTER — Ambulatory Visit (HOSPITAL_BASED_OUTPATIENT_CLINIC_OR_DEPARTMENT_OTHER): Payer: Medicare Other | Admitting: Anesthesiology

## 2015-11-29 ENCOUNTER — Encounter (HOSPITAL_BASED_OUTPATIENT_CLINIC_OR_DEPARTMENT_OTHER)
Admission: RE | Disposition: A | Discharge status: Home / Self Care | Payer: Self-pay | Source: Ambulatory Visit | Attending: Surgery

## 2015-11-29 ENCOUNTER — Ambulatory Visit (HOSPITAL_BASED_OUTPATIENT_CLINIC_OR_DEPARTMENT_OTHER)
Admission: RE | Admit: 2015-11-29 | Discharge: 2015-11-29 | Disposition: A | Discharge status: Home / Self Care | Payer: Medicare Other | Source: Ambulatory Visit | Attending: Surgery | Admitting: Surgery

## 2015-11-29 DIAGNOSIS — K429 Umbilical hernia without obstruction or gangrene: Secondary | ICD-10-CM | POA: Insufficient documentation

## 2015-11-29 DIAGNOSIS — E039 Hypothyroidism, unspecified: Secondary | ICD-10-CM | POA: Insufficient documentation

## 2015-11-29 DIAGNOSIS — K219 Gastro-esophageal reflux disease without esophagitis: Secondary | ICD-10-CM | POA: Diagnosis not present

## 2015-11-29 DIAGNOSIS — E119 Type 2 diabetes mellitus without complications: Secondary | ICD-10-CM | POA: Diagnosis not present

## 2015-11-29 DIAGNOSIS — Z87891 Personal history of nicotine dependence: Secondary | ICD-10-CM | POA: Insufficient documentation

## 2015-11-29 DIAGNOSIS — J45909 Unspecified asthma, uncomplicated: Secondary | ICD-10-CM | POA: Insufficient documentation

## 2015-11-29 DIAGNOSIS — I4891 Unspecified atrial fibrillation: Secondary | ICD-10-CM | POA: Insufficient documentation

## 2015-11-29 DIAGNOSIS — G4730 Sleep apnea, unspecified: Secondary | ICD-10-CM | POA: Insufficient documentation

## 2015-11-29 DIAGNOSIS — Z7901 Long term (current) use of anticoagulants: Secondary | ICD-10-CM | POA: Insufficient documentation

## 2015-11-29 DIAGNOSIS — I10 Essential (primary) hypertension: Secondary | ICD-10-CM | POA: Diagnosis not present

## 2015-11-29 DIAGNOSIS — Z79891 Long term (current) use of opiate analgesic: Secondary | ICD-10-CM | POA: Insufficient documentation

## 2015-11-29 DIAGNOSIS — E669 Obesity, unspecified: Secondary | ICD-10-CM | POA: Insufficient documentation

## 2015-11-29 DIAGNOSIS — Z7984 Long term (current) use of oral hypoglycemic drugs: Secondary | ICD-10-CM | POA: Insufficient documentation

## 2015-11-29 DIAGNOSIS — Z6833 Body mass index (BMI) 33.0-33.9, adult: Secondary | ICD-10-CM | POA: Insufficient documentation

## 2015-11-29 HISTORY — PX: INSERTION OF MESH: SHX5868

## 2015-11-29 HISTORY — PX: UMBILICAL HERNIA REPAIR: SHX196

## 2015-11-29 LAB — GLUCOSE, CAPILLARY
GLUCOSE-CAPILLARY: 195 mg/dL — AB (ref 65–99)
Glucose-Capillary: 187 mg/dL — ABNORMAL HIGH (ref 65–99)

## 2015-11-29 SURGERY — REPAIR, HERNIA, UMBILICAL, ADULT
Anesthesia: General | Site: Abdomen

## 2015-11-29 MED ORDER — DEXAMETHASONE SODIUM PHOSPHATE 10 MG/ML IJ SOLN
INTRAMUSCULAR | Status: AC
  Filled 2015-11-29: qty 1

## 2015-11-29 MED ORDER — GLYCOPYRROLATE 0.2 MG/ML IJ SOLN
INTRAMUSCULAR | Status: AC
  Filled 2015-11-29: qty 1

## 2015-11-29 MED ORDER — OXYCODONE HCL 5 MG PO TABS
ORAL_TABLET | ORAL | Status: AC
  Filled 2015-11-29: qty 1

## 2015-11-29 MED ORDER — FENTANYL CITRATE (PF) 100 MCG/2ML IJ SOLN
INTRAMUSCULAR | Status: AC
  Filled 2015-11-29: qty 2

## 2015-11-29 MED ORDER — EPHEDRINE SULFATE 50 MG/ML IJ SOLN
INTRAMUSCULAR | Status: AC
  Filled 2015-11-29: qty 1

## 2015-11-29 MED ORDER — LIDOCAINE HCL (CARDIAC) 20 MG/ML IV SOLN
INTRAVENOUS | Status: AC
  Filled 2015-11-29: qty 5

## 2015-11-29 MED ORDER — CEFAZOLIN SODIUM-DEXTROSE 2-3 GM-% IV SOLR
INTRAVENOUS | Status: DC | PRN
Start: 2015-11-29 — End: 2015-11-29
  Administered 2015-11-29: 2 g via INTRAVENOUS

## 2015-11-29 MED ORDER — ONDANSETRON HCL 4 MG/2ML IJ SOLN
INTRAMUSCULAR | Status: AC
  Filled 2015-11-29: qty 2

## 2015-11-29 MED ORDER — LIDOCAINE HCL (PF) 1 % IJ SOLN
INTRAMUSCULAR | Status: AC
  Filled 2015-11-29: qty 30

## 2015-11-29 MED ORDER — LIDOCAINE HCL (CARDIAC) 20 MG/ML IV SOLN
INTRAVENOUS | Status: DC | PRN
  Administered 2015-11-29: 100 mg via INTRAVENOUS

## 2015-11-29 MED ORDER — MIDAZOLAM HCL 2 MG/2ML IJ SOLN: 1.0000 mg | INTRAMUSCULAR | Status: DC | PRN

## 2015-11-29 MED ORDER — ONDANSETRON HCL 4 MG/2ML IJ SOLN
4.0000 mg | Freq: Once | INTRAMUSCULAR | Status: DC | PRN
Start: 2015-11-29 — End: 2015-11-29

## 2015-11-29 MED ORDER — OXYCODONE-ACETAMINOPHEN 5-325 MG PO TABS: ORAL_TABLET | ORAL | Status: DC

## 2015-11-29 MED ORDER — SODIUM BICARBONATE 4 % IV SOLN
INTRAVENOUS | Status: AC
  Filled 2015-11-29: qty 5

## 2015-11-29 MED ORDER — SCOPOLAMINE 1 MG/3DAYS TD PT72
1.0000 | MEDICATED_PATCH | Freq: Once | TRANSDERMAL | Status: DC | PRN
Start: 2015-11-29 — End: 2015-11-29

## 2015-11-29 MED ORDER — GLYCOPYRROLATE 0.2 MG/ML IJ SOLN
INTRAMUSCULAR | Status: DC | PRN
  Administered 2015-11-29: 0.2 mg via INTRAVENOUS

## 2015-11-29 MED ORDER — PROPOFOL 10 MG/ML IV BOLUS
INTRAVENOUS | Status: DC | PRN
  Administered 2015-11-29: 50 mg via INTRAVENOUS
  Administered 2015-11-29: 200 mg via INTRAVENOUS

## 2015-11-29 MED ORDER — FENTANYL CITRATE (PF) 100 MCG/2ML IJ SOLN: 50.0000 ug | INTRAMUSCULAR | Status: DC | PRN

## 2015-11-29 MED ORDER — FENTANYL CITRATE (PF) 100 MCG/2ML IJ SOLN
INTRAMUSCULAR | Status: DC | PRN
  Administered 2015-11-29: 50 ug via INTRAVENOUS

## 2015-11-29 MED ORDER — BUPIVACAINE-EPINEPHRINE (PF) 0.5% -1:200000 IJ SOLN
INTRAMUSCULAR | Status: AC
  Filled 2015-11-29: qty 30

## 2015-11-29 MED ORDER — FENTANYL CITRATE (PF) 100 MCG/2ML IJ SOLN
25.0000 ug | INTRAMUSCULAR | Status: DC | PRN
  Administered 2015-11-29 (×3): 50 ug via INTRAVENOUS

## 2015-11-29 MED ORDER — CEFAZOLIN SODIUM-DEXTROSE 2-3 GM-% IV SOLR: 2.0000 g | INTRAVENOUS | Status: DC

## 2015-11-29 MED ORDER — LACTATED RINGERS IV SOLN
INTRAVENOUS | Status: DC | PRN
  Administered 2015-11-29 (×2): via INTRAVENOUS

## 2015-11-29 MED ORDER — GLYCOPYRROLATE 0.2 MG/ML IJ SOLN
0.2000 mg | Freq: Once | INTRAMUSCULAR | Status: DC | PRN
Start: 2015-11-29 — End: 2015-11-29

## 2015-11-29 MED ORDER — LACTATED RINGERS IV SOLN
INTRAVENOUS | Status: DC
  Administered 2015-11-29: 10 mL/h via INTRAVENOUS

## 2015-11-29 MED ORDER — OXYCODONE HCL 5 MG PO TABS
5.0000 mg | ORAL_TABLET | Freq: Once | ORAL | Status: AC | PRN
  Administered 2015-11-29: 5 mg via ORAL

## 2015-11-29 MED ORDER — PROPOFOL 10 MG/ML IV BOLUS
INTRAVENOUS | Status: AC
  Filled 2015-11-29: qty 40

## 2015-11-29 MED ORDER — CEFAZOLIN SODIUM-DEXTROSE 2-3 GM-% IV SOLR
INTRAVENOUS | Status: AC
  Filled 2015-11-29: qty 50

## 2015-11-29 MED ORDER — EPHEDRINE SULFATE 50 MG/ML IJ SOLN
INTRAMUSCULAR | Status: DC | PRN
  Administered 2015-11-29: 10 mg via INTRAVENOUS

## 2015-11-29 MED ORDER — BUPIVACAINE-EPINEPHRINE 0.5% -1:200000 IJ SOLN
INTRAMUSCULAR | Status: DC | PRN
  Administered 2015-11-29: 15 mL

## 2015-11-29 SURGICAL SUPPLY — 44 items
BLADE CLIPPER SURG (BLADE) ×3 IMPLANT
BLADE HEX COATED 2.75 (ELECTRODE) ×3 IMPLANT
BLADE SURG 15 STRL LF DISP TIS (BLADE) ×1 IMPLANT
BLADE SURG 15 STRL SS (BLADE) ×2
CANISTER SUCT 1200ML W/VALVE (MISCELLANEOUS) IMPLANT
CHLORAPREP W/TINT 26ML (MISCELLANEOUS) ×3 IMPLANT
COVER BACK TABLE 60X90IN (DRAPES) ×3 IMPLANT
COVER MAYO STAND STRL (DRAPES) ×3 IMPLANT
DECANTER SPIKE VIAL GLASS SM (MISCELLANEOUS) IMPLANT
DRAPE LAPAROTOMY 100X72 PEDS (DRAPES) ×3 IMPLANT
DRAPE UTILITY XL STRL (DRAPES) ×3 IMPLANT
DRSG TEGADERM 2-3/8X2-3/4 SM (GAUZE/BANDAGES/DRESSINGS) IMPLANT
ELECT REM PT RETURN 9FT ADLT (ELECTROSURGICAL) ×3
ELECTRODE REM PT RTRN 9FT ADLT (ELECTROSURGICAL) ×1 IMPLANT
GLOVE BIOGEL PI IND STRL 7.0 (GLOVE) ×1 IMPLANT
GLOVE BIOGEL PI INDICATOR 7.0 (GLOVE) ×2
GLOVE ECLIPSE 6.5 STRL STRAW (GLOVE) ×3 IMPLANT
GLOVE EXAM NITRILE LRG STRL (GLOVE) ×3 IMPLANT
GLOVE SURG SIGNA 7.5 PF LTX (GLOVE) ×3 IMPLANT
GOWN STRL REUS W/ TWL LRG LVL3 (GOWN DISPOSABLE) ×1 IMPLANT
GOWN STRL REUS W/ TWL XL LVL3 (GOWN DISPOSABLE) ×1 IMPLANT
GOWN STRL REUS W/TWL LRG LVL3 (GOWN DISPOSABLE) ×2
GOWN STRL REUS W/TWL XL LVL3 (GOWN DISPOSABLE) ×2
LIQUID BAND (GAUZE/BANDAGES/DRESSINGS) ×3 IMPLANT
MESH VENTRALEX ST 1-7/10 CRC S (Mesh General) ×3 IMPLANT
NEEDLE HYPO 25X1 1.5 SAFETY (NEEDLE) ×3 IMPLANT
NS IRRIG 1000ML POUR BTL (IV SOLUTION) IMPLANT
PACK BASIN DAY SURGERY FS (CUSTOM PROCEDURE TRAY) ×3 IMPLANT
PENCIL BUTTON HOLSTER BLD 10FT (ELECTRODE) ×3 IMPLANT
SLEEVE SCD COMPRESS KNEE MED (MISCELLANEOUS) ×3 IMPLANT
SPONGE LAP 4X18 X RAY DECT (DISPOSABLE) ×3 IMPLANT
SUT MNCRL AB 4-0 PS2 18 (SUTURE) ×3 IMPLANT
SUT NOVA 0 T19/GS 22DT (SUTURE) IMPLANT
SUT NOVA NAB DX-16 0-1 5-0 T12 (SUTURE) ×3 IMPLANT
SUT VIC AB 2-0 SH 27 (SUTURE)
SUT VIC AB 2-0 SH 27XBRD (SUTURE) IMPLANT
SUT VIC AB 3-0 SH 27 (SUTURE) ×2
SUT VIC AB 3-0 SH 27X BRD (SUTURE) ×1 IMPLANT
SYR CONTROL 10ML LL (SYRINGE) ×3 IMPLANT
TOWEL OR 17X24 6PK STRL BLUE (TOWEL DISPOSABLE) ×3 IMPLANT
TOWEL OR NON WOVEN STRL DISP B (DISPOSABLE) IMPLANT
TUBE CONNECTING 20'X1/4 (TUBING)
TUBE CONNECTING 20X1/4 (TUBING) IMPLANT
YANKAUER SUCT BULB TIP NO VENT (SUCTIONS) IMPLANT

## 2015-11-29 NOTE — Interval H&P Note (Signed)
History and Physical Interval Note:no change in H and P  11/29/2015 7:02 AM  Colton Brooks  has presented today for surgery, with the diagnosis of UMBILICAL HERNIA   The various methods of treatment have been discussed with the patient and family. After consideration of risks, benefits and other options for treatment, the patient has consented to  Procedure(s): HERNIA REPAIR UMBILICAL ADULT WITH MESH (N/A) INSERTION OF MESH (N/A) as a surgical intervention .  The patient's history has been reviewed, patient examined, no change in status, stable for surgery.  I have reviewed the patient's chart and labs.  Questions were answered to the patient's satisfaction.     Ozro Russett A

## 2015-11-29 NOTE — Anesthesia Postprocedure Evaluation (Signed)
Anesthesia Post Note  Patient: Colton Brooks  Procedure(s) Performed: Procedure(s) (LRB): HERNIA REPAIR UMBILICAL ADULT WITH MESH (N/A) INSERTION OF MESH (N/A)  Patient location during evaluation: PACU Anesthesia Type: General Level of consciousness: awake and alert, oriented, awake and patient cooperative Pain management: pain level controlled Vital Signs Assessment: post-procedure vital signs reviewed and stable Respiratory status: spontaneous breathing, nonlabored ventilation, respiratory function stable and patient connected to nasal cannula oxygen Cardiovascular status: blood pressure returned to baseline and stable Postop Assessment: no signs of nausea or vomiting Anesthetic complications: no    Last Vitals:  Filed Vitals:   11/29/15 0915 11/29/15 1000  BP: 135/77 139/72  Pulse: 59 55  Temp:  36.4 C  Resp: 12 16    Last Pain:  Filed Vitals:   11/29/15 1007  PainSc: 3                  Catalina Gravel

## 2015-11-29 NOTE — Op Note (Signed)
HERNIA REPAIR UMBILICAL ADULT WITH MESH, INSERTION OF MESH  Procedure Note  Colton Brooks 11/29/2015   Pre-op Diagnosis: UMBILICAL HERNIA      Post-op Diagnosis: same  Procedure(s): HERNIA REPAIR UMBILICAL ADULT WITH MESH INSERTION OF MESH  Surgeon(s): Coralie Keens, MD  Anesthesia: General  Staff:  Circulator: Lisbeth Ply, RN Scrub Person: Rhea Pink Neiers, CST  Estimated Blood Loss: Minimal                         Colton Brooks   Date: 11/29/2015  Time: 8:07 AM

## 2015-11-29 NOTE — Transfer of Care (Signed)
Immediate Anesthesia Transfer of Care Note  Patient: Colton Brooks  Procedure(s) Performed: Procedure(s): HERNIA REPAIR UMBILICAL ADULT WITH MESH (N/A) INSERTION OF MESH (N/A)  Patient Location: PACU  Anesthesia Type:General  Level of Consciousness: awake and patient cooperative  Airway & Oxygen Therapy: Patient Spontanous Breathing and Patient connected to face mask oxygen  Post-op Assessment: Report given to RN and Post -op Vital signs reviewed and stable  Post vital signs: Reviewed and stable  Last Vitals:  Filed Vitals:   11/29/15 0715 11/29/15 0812  BP: 155/75   Pulse: 55 50  Temp: 36.5 C   Resp: 20 16    Complications: No apparent anesthesia complications

## 2015-11-29 NOTE — Anesthesia Procedure Notes (Signed)
Procedure Name: LMA Insertion Date/Time: 11/29/2015 7:38 AM Performed by: Toula Moos L Pre-anesthesia Checklist: Patient identified, Emergency Drugs available, Suction available, Patient being monitored and Timeout performed Patient Re-evaluated:Patient Re-evaluated prior to inductionOxygen Delivery Method: Circle System Utilized Preoxygenation: Pre-oxygenation with 100% oxygen Intubation Type: IV induction Ventilation: Mask ventilation without difficulty LMA: LMA inserted LMA Size: 5.0 Number of attempts: 1 Airway Equipment and Method: Bite block Placement Confirmation: positive ETCO2 Tube secured with: Tape Dental Injury: Teeth and Oropharynx as per pre-operative assessment

## 2015-11-29 NOTE — Op Note (Deleted)
NAMEWILLIM, BREGMAN NO.:  [id number]  MEDICAL RECORD NO.:  [id number]  LOCATION:                               FACILITY:  [facility]  PHYSICIAN:  Coralie Keens, M.D. DATE OF BIRTH:  1940-11-27  DATE OF PROCEDURE:  11/29/2015 DATE OF DISCHARGE:  11/29/2015                              OPERATIVE REPORT   PREOPERATIVE DIAGNOSIS:  Umbilical hernia.  POSTOPERATIVE DIAGNOSIS:  Umbilical hernia.  PROCEDURES:  Umbilical hernia repair with mesh.  SURGEON:  Coralie Keens, M.D.  ANESTHESIA:  General, 0.25% Marcaine with epinephrine.  ESTIMATED BLOOD LOSS:  Minimal.  PROCEDURE IN DETAIL:  The patient was brought to the operating room identified as Colton Brooks.  He was placed supine on the operating room table.  General anesthesia was induced.  His abdomen was then prepped and draped in usual sterile fashion.  I anesthetized the skin of the lower edge of the umbilicus with Marcaine and made a small transverse incision with a scalpel.  I took this down to the hernia sac which I then separated from the overlying umbilical skin.  The patient had incarcerated omentum in the hernia sac.  The actual fascial defect itself was slightly less than 1 cm.  I excised the hernia sac in the omentum and reduced the risk, the contents into the abdominal cavity.  I then brought a 4.3 cm round Ventralex patch onto the field from [employer].  I placed it through the fascia opening and pulled against the peritoneum with the stay ties.  I then sewed the mesh in place circumferentially with interrupted #1 Novafil sutures.  I then cut the stay ties, closed the fascia over the top of the mesh with a figure-of-eight #1 Novafil suture as well.  Wide coverage of the small fascial defect appeared to be achieved.  I then anesthetized the fascia skin further with Marcaine. I tacked the umbilical skin back in place with a 3-0 Vicryl suture.  I then closed subcutaneous tissue with interrupted 3-0  Vicryl sutures and closed the skin with a running 4-0 Monocryl.  Skin glue was then applied.  The patient tolerated the procedure well.  All the counts were correct at the end of procedure.  The patient was then extubated in the operating room and taken in a stable condition to the recovery room.     Coralie Keens, M.D.     DB/MEDQ  D:  11/29/2015  T:  11/29/2015  Job:  [id number]

## 2015-11-29 NOTE — Discharge Instructions (Signed)
[facility] _______[facility]  UMBILICAL OR INGUINAL HERNIA REPAIR: POST OP INSTRUCTIONS  Always review your discharge instruction sheet given to you by the facility where your surgery was performed. IF YOU HAVE DISABILITY OR FAMILY LEAVE FORMS, YOU MUST BRING THEM TO THE OFFICE FOR PROCESSING.   DO NOT GIVE THEM TO YOUR DOCTOR.  1. A  prescription for pain medication may be given to you upon discharge.  Take your pain medication as prescribed, if needed.  If narcotic pain medicine is not needed, then you may take acetaminophen (Tylenol) or ibuprofen (Advil) as needed. 2. Take your usually prescribed medications unless otherwise directed. 3. If you need a refill on your pain medication, please contact your pharmacy.  They will contact our office to request authorization. Prescriptions will not be filled after 5 pm or on week-ends. 4. You should follow a light diet the first 24 hours after arrival home, such as soup and crackers, etc.  Be sure to include lots of fluids daily.  Resume your normal diet the day after surgery. 5. Most patients will experience some swelling and bruising around the umbilicus or in the groin and scrotum.  Ice packs and reclining will help.  Swelling and bruising can take several days to resolve.  6. It is common to experience some constipation if taking pain medication after surgery.  Increasing fluid intake and taking a stool softener (such as Colace) will usually help or prevent this problem from occurring.  A mild laxative (Milk of Magnesia or Miralax) should be taken according to package directions if there are no bowel movements after 48 hours. 7. Unless discharge instructions indicate otherwise, you may remove your bandages 24-48 hours after surgery, and you may shower at that time.  You may have steri-strips (small skin tapes) in place directly over the incision.  These strips should be left on the skin for 7-10 days.  If your surgeon used skin glue on the  incision, you may shower in 24 hours.  The glue will flake off over the next 2-3 weeks.  Any sutures or staples will be removed at the office during your follow-up visit. 8. ACTIVITIES:  You may resume regular (light) daily activities beginning the next day--such as daily self-care, walking, climbing stairs--gradually increasing activities as tolerated.  You may have sexual intercourse when it is comfortable.  Refrain from any heavy lifting or straining until approved by your doctor. a. You may drive when you are no longer taking prescription pain medication, you can comfortably wear a seatbelt, and you can safely maneuver your car and apply brakes. b. RETURN TO WORK:  __________________________________________________________ 9. You should see your doctor in the office for a follow-up appointment approximately 2-3 weeks after your surgery.  Make sure that you call for this appointment within a day or two after you arrive home to insure a convenient appointment time. 10. OTHER INSTRUCTIONS: NO LIFTING MORE THAN 15 TO 20 POUNDS FOR 4 WEEKS 11. ICE PACK AND IBUPROFEN ALSO FOR PAIN 12. OK TO SHOWER TOMORROW __________________________________________________________________________________________________________________________________________________________________________________________  WHEN TO CALL YOUR DOCTOR: 1. Fever over 101.0 2. Inability to urinate 3. Nausea and/or vomiting 4. Extreme swelling or bruising 5. Continued bleeding from incision. 6. Increased pain, redness, or drainage from the incision  The clinic staff is available to answer your questions during regular business hours.  Please dont hesitate to call and ask to speak to one of the nurses for clinical concerns.  If you have a medical emergency, go to  the nearest emergency room or call 911.  A surgeon from [facility] is always on call at the hospital   [address], [place], [place], [place]  [address]  ?  [address], [place], [place]   [address] [phone] ? [phone] ? FAX [phone] Web site: [email or web address]    Post Anesthesia Home Care Instructions  Activity: Get plenty of rest for the remainder of the day. A responsible adult should stay with you for 24 hours following the procedure.  For the next 24 hours, DO NOT: -Drive a car -Paediatric nurse -Drink alcoholic beverages -Take any medication unless instructed by your physician -Make any legal decisions or sign important papers.  Meals: Start with liquid foods such as gelatin or soup. Progress to regular foods as tolerated. Avoid greasy, spicy, heavy foods. If nausea and/or vomiting occur, drink only clear liquids until the nausea and/or vomiting subsides. Call your physician if vomiting continues.  Special Instructions/Symptoms: Your throat may feel dry or sore from the anesthesia or the breathing tube placed in your throat during surgery. If this causes discomfort, gargle with warm salt water. The discomfort should disappear within 24 hours.  If you had a scopolamine patch placed behind your ear for the management of post- operative nausea and/or vomiting:  1. The medication in the patch is effective for 72 hours, after which it should be removed.  Wrap patch in a tissue and discard in the trash. Wash hands thoroughly with soap and water. 2. You may remove the patch earlier than 72 hours if you experience unpleasant side effects which may include dry mouth, dizziness or visual disturbances. 3. Avoid touching the patch. Wash your hands with soap and water after contact with the patch.

## 2015-11-29 NOTE — Anesthesia Preprocedure Evaluation (Addendum)
Anesthesia Evaluation  Patient identified by MRN, date of birth, ID band Patient awake    Reviewed: Allergy & Precautions, NPO status , Patient's Chart, lab work & pertinent test results, reviewed documented beta blocker date and time   History of Anesthesia Complications Negative for: history of anesthetic complications  Airway Mallampati: II  TM Distance: >3 FB Neck ROM: Full    Dental  (+) Teeth Intact, Dental Advisory Given   Pulmonary shortness of breath, asthma , sleep apnea and Continuous Positive Airway Pressure Ventilation , former smoker,    Pulmonary exam normal breath sounds clear to auscultation       Cardiovascular hypertension, Pt. on medications and Pt. on home beta blockers +CHF  + dysrhythmias Atrial Fibrillation  Rhythm:Irregular Rate:Bradycardia     Neuro/Psych negative neurological ROS  negative psych ROS   GI/Hepatic Neg liver ROS, GERD  Medicated,  Endo/Other  diabetes, Type 2, Oral Hypoglycemic AgentsHypothyroidism Obesity   Renal/GU negative Renal ROS     Musculoskeletal negative musculoskeletal ROS (+)   Abdominal   Peds  Hematology negative hematology ROS (+)   Anesthesia Other Findings Day of surgery medications reviewed with the patient.  Reproductive/Obstetrics                          Anesthesia Physical Anesthesia Plan  ASA: III  Anesthesia Plan: General   Post-op Pain Management:    Induction: Intravenous  Airway Management Planned: LMA  Additional Equipment:   Intra-op Plan:   Post-operative Plan: Extubation in OR  Informed Consent: I have reviewed the patients History and Physical, chart, labs and discussed the procedure including the risks, benefits and alternatives for the proposed anesthesia with the patient or authorized representative who has indicated his/her understanding and acceptance.   Dental advisory given  Plan Discussed with:  CRNA  Anesthesia Plan Comments: (Risks/benefits of general anesthesia discussed with patient including risk of damage to teeth, lips, gum, and tongue, nausea/vomiting, allergic reactions to medications, and the possibility of heart attack, stroke and death.  All patient questions answered.  Patient wishes to proceed.)       Anesthesia Quick Evaluation

## 2015-11-29 NOTE — Op Note (Signed)
NAMEWLADIMIR, Colton Brooks NO.:  [id number]  MEDICAL RECORD NO.:  [id number]  LOCATION:                               FACILITY:  [facility]  PHYSICIAN:  Coralie Keens, M.D. DATE OF BIRTH:  1941-07-25  DATE OF PROCEDURE:  11/29/2015 DATE OF DISCHARGE:  11/29/2015                              OPERATIVE REPORT   PREOPERATIVE DIAGNOSIS:  Umbilical hernia.  POSTOPERATIVE DIAGNOSIS:  Umbilical hernia.  PROCEDURES:  Umbilical hernia repair with mesh.  SURGEON:  Coralie Keens, M.D.  ANESTHESIA:  General, 0.25% Marcaine with epinephrine.  ESTIMATED BLOOD LOSS:  Minimal.  PROCEDURE IN DETAIL:  The patient was brought to the operating room identified as [name].  He was placed supine on the operating room table.  General anesthesia was induced.  His abdomen was then prepped and draped in usual sterile fashion.  I anesthetized the skin of the lower edge of the umbilicus with Marcaine and made a small transverse incision with a scalpel.  I took this down to the hernia sac which I then separated from the overlying umbilical skin.  The patient had incarcerated omentum in the hernia sac.  The actual fascial defect itself was slightly less than 1 cm.  I excised the hernia sac in the omentum and reduced the risk, the contents into the abdominal cavity.  I then brought a 4.3 cm round Ventralex patch onto the field from [employer].  I placed it through the fascia opening and pulled against the peritoneum with the stay ties.  I then sewed the mesh in place circumferentially with interrupted #1 Novafil sutures.  I then cut the stay ties, closed the fascia over the top of the mesh with a figure-of-eight #1 Novafil suture as well.  Wide coverage of the small fascial defect appeared to be achieved.  I then anesthetized the fascia skin further with Marcaine. I tacked the umbilical skin back in place with a 3-0 Vicryl suture.  I then closed subcutaneous tissue with interrupted 3-0  Vicryl sutures and closed the skin with a running 4-0 Monocryl.  Skin glue was then applied.  The patient tolerated the procedure well.  All the counts were correct at the end of procedure.  The patient was then extubated in the operating room and taken in a stable condition to the recovery room.     Coralie Keens, M.D.     DB/MEDQ  D:  11/29/2015  T:  11/29/2015  Job:  [id number]

## 2015-11-30 ENCOUNTER — Telehealth: Payer: Self-pay | Admitting: *Deleted

## 2015-11-30 ENCOUNTER — Encounter (HOSPITAL_BASED_OUTPATIENT_CLINIC_OR_DEPARTMENT_OTHER): Payer: Self-pay | Admitting: Surgery

## 2015-11-30 MED ORDER — AMLODIPINE BESYLATE 5 MG PO TABS: 5.0000 mg | ORAL_TABLET | Freq: Every day | ORAL | Status: DC

## 2015-11-30 MED ORDER — LOSARTAN POTASSIUM 50 MG PO TABS: 50.0000 mg | ORAL_TABLET | Freq: Every day | ORAL | Status: DC

## 2015-11-30 NOTE — Telephone Encounter (Signed)
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  I would like to switch his medications:  Discontinue quinalapril and cardizem  Start:  Losartan 50 mg po daily Amlodipine 5 mg po daily  Follow up in the clinic in 4 weeks, if hypertensive, we can uptitrate amlodipine to 10 mg po daily and losartan to 100 mg po daily - dont tell this to the patient, just copy into your note so the APP seeing the patient in 1 month would know the plan. He will also need follow up ECG at that visit as his QT is prolonged.  Thank you  KN.

## 2015-11-30 NOTE — Telephone Encounter (Signed)
Notified the pt that per Dr Meda Coffee, his echo showed that he has abnormal LVEF that is mildly decreased, estimated at 45-50 %, and he also has pulmonary HTN.  Informed the pt that per Dr Meda Coffee, she recommends that he stop taking quinapril and cardizem and start 2 new medications, losartan 50 mg po daily and amlodipine 5 mg po daily Per the pt he states he is no longer taking cardizem and hasn't for years.  Pt understood that he should still discontinue taking quinapril.  Pt requesting that both his amlodipine and losartan to be sent to [facility] for a month supply, then [facility] to [facility] for a 90 day supply, thereafter.  Informed the pt I will send this to his local pharmacy to get started on, but he should notify our office when he requires refills for these meds, so we can kick this over to [facility].  Also informed the pt that Dr Meda Coffee recommends that he come in for a follow-up visit with one of our extenders, in 4 weeks.  Informed the pt and spouse that we have scheduled for him to come in on Dec 31, 2015 at 0830 with Richardson Dopp PA-C [address] location.  Pt agreed to this appt date and time.  Pt did want me to clarify with Dr Meda Coffee if he should d/c his verapamil or stay on this while taking these 2 new meds.  Informed the pt that I spoke with Dr Meda Coffee about this, and she does recommend that we discontinue his Verapamil and start him on losartan and amlodipine.  Confirmed the pharmacy of choice with the pt.  Pt verbalized understanding and agrees with the plan mentioned above.  Updated in appt notes that the pt will need an EKG at his next visit on 12/31/15 per Dr Meda Coffee, for his QT is prolonged.  Will route this message to Richardson Dopp PA-C, as Dr Meda Coffee requested that I send this to him as an fyi, for he will see the pt on 12/31/15.

## 2015-12-01 ENCOUNTER — Ambulatory Visit: Payer: Medicare Other | Admitting: Internal Medicine

## 2015-12-07 DIAGNOSIS — M1A09X0 Idiopathic chronic gout, multiple sites, without tophus (tophi): Secondary | ICD-10-CM | POA: Diagnosis not present

## 2015-12-07 DIAGNOSIS — N183 Chronic kidney disease, stage 3 (moderate): Secondary | ICD-10-CM | POA: Diagnosis not present

## 2015-12-07 DIAGNOSIS — M79645 Pain in left finger(s): Secondary | ICD-10-CM | POA: Diagnosis not present

## 2015-12-07 DIAGNOSIS — M150 Primary generalized (osteo)arthritis: Secondary | ICD-10-CM | POA: Diagnosis not present

## 2015-12-14 DIAGNOSIS — Z7901 Long term (current) use of anticoagulants: Secondary | ICD-10-CM | POA: Diagnosis not present

## 2015-12-14 DIAGNOSIS — E1129 Type 2 diabetes mellitus with other diabetic kidney complication: Secondary | ICD-10-CM | POA: Diagnosis not present

## 2015-12-14 DIAGNOSIS — Z1389 Encounter for screening for other disorder: Secondary | ICD-10-CM | POA: Diagnosis not present

## 2015-12-14 DIAGNOSIS — Z6832 Body mass index (BMI) 32.0-32.9, adult: Secondary | ICD-10-CM | POA: Diagnosis not present

## 2015-12-14 DIAGNOSIS — E784 Other hyperlipidemia: Secondary | ICD-10-CM | POA: Diagnosis not present

## 2015-12-14 DIAGNOSIS — I10 Essential (primary) hypertension: Secondary | ICD-10-CM | POA: Diagnosis not present

## 2015-12-14 DIAGNOSIS — I480 Paroxysmal atrial fibrillation: Secondary | ICD-10-CM | POA: Diagnosis not present

## 2015-12-14 NOTE — Op Note (Signed)
NAMEALEKI, HAFEN NO.:  [id number]  MEDICAL RECORD NO.:  [id number]  LOCATION:                                 FACILITY:  PHYSICIAN:  Coralie Keens, M.D. DATE OF BIRTH:  1941/10/15  DATE OF PROCEDURE: DATE OF DISCHARGE:                              OPERATIVE REPORT   PREOPERATIVE DIAGNOSIS:  Umbilical hernia.  POSTOPERATIVE DIAGNOSIS:  Umbilical hernia.  PROCEDURE:  Umbilical hernia repair with mesh.  SURGEON:  Coralie Keens, M.D.  ANESTHESIA:  General and 0.5% Marcaine.  ESTIMATED BLOOD LOSS:  Minimal.  PROCEDURE IN DETAIL:  The patient was brought to the operating room, identified as correct patient.  He was placed supine on the operating table and anesthesia was induced.  His abdomen was then prepped and draped in usual sterile fashion.  I made a small incision near the umbilicus.  I took this down to the hernia sac.  The hernia sac was completely excised and the omentum was reduced back into the abdominal cavity.  I then brought a 4.3 cm round ventral patch onto the field.  I placed through the fascial opening and pulled it up against perineum with stay ties.  I inserted it in place with interrupted Novafil sutures.  I then cut the stay ties.  I then closed the fascia over top of this with a #1 Novafil suture as well.  I then anesthetized the fascia with Marcaine.  I closed subcutaneous tissue with interrupted 3-0 Vicryl sutures, closed the skin with a running 4-0 Monocryl.  Skin glue was then applied.  The patient tolerated the procedure well.  All the counts were correct at the end of procedure.  The patient was then extubated in the operating room, taken in stable condition to the recovery room.     Coralie Keens, M.D.     DB/MEDQ  D:  12/13/2015  T:  12/13/2015  Job:  [id number]

## 2015-12-15 DIAGNOSIS — Z85828 Personal history of other malignant neoplasm of skin: Secondary | ICD-10-CM | POA: Diagnosis not present

## 2015-12-15 DIAGNOSIS — L821 Other seborrheic keratosis: Secondary | ICD-10-CM | POA: Diagnosis not present

## 2015-12-30 NOTE — Progress Notes (Signed)
Cardiology Office Note:    Date:  12/31/2015   ID:  Colton Brooks, DOB 15-Aug-1941, MRN [id number]  PCP:  Marton Redwood, MD  Cardiologist:  Dr. Ena Dawley   Electrophysiologist:  n/a  Chief Complaint  Patient presents with  . Cardiomyopathy    Follow up     History of Present Illness:     Colton Brooks is a 75 y.o. male with a hx of chronic AFib, bradycardia, OSA, HTN, HL, DM2.  LHC in 2011 demonstrated no CAD.  Last seen by Dr. Ena Dawley 12/16 for DOE.  QTc was prolonged (494 - prior was 516 ms).  Echo was arranged.  Echo 11/26/15 demonstrated EF 45-50%, BAE, PASP 55 mmHg.  Verapamil and Quinapril was DC'd and he was placed on Losartan and Amlodipine (can titrate to Losartan 100 and Amlodipine 10 if needed).  Returns for FU.  He is here with his wife.  He is doing well.  He had umbilical hernia repair last month.  He is somewhat weak.  But, he feels his SOB is better.  Denies any chest pain. Denies orthopnea.  Sleeps with CPAP. Denies syncope.  He struggles with BPPV.  He denies any bleeding issues.  Denies cough.  Of note, he noticed his HR was higher off of the Verapamil (70s).  He noted palpitations with this.  He ran out of Norvasc 2 days ago and resumed Verapamil.  His PCP increased Losartan to 50 mg bid.  His BP has been optimal.      Past Medical History  Diagnosis Date  . Allergic rhinitis, cause unspecified   . Asthma   . AF (atrial fibrillation) ([place])   . OSA on CPAP   . Type II or unspecified type diabetes mellitus without mention of complication, not stated as uncontrolled     Past Surgical History  Procedure Laterality Date  . Knee arthroscopy    . Tonsillectomy    . Cardiac catheterization    . Umbilical hernia repair N/A 11/29/2015    Procedure: HERNIA REPAIR UMBILICAL ADULT WITH MESH;  Surgeon: Coralie Keens, MD;  Location: [facility];  Service: General;  Laterality: N/A;  . Insertion of mesh N/A 11/29/2015    Procedure:  INSERTION OF MESH;  Surgeon: Coralie Keens, MD;  Location: [facility];  Service: General;  Laterality: N/A;    Current Medications: Outpatient Encounter Prescriptions as of 12/31/2015  Medication Sig Note  . Alpha-D-Galactosidase (BEANO) TABS Take by mouth as needed.    [name] amLODipine (NORVASC) 5 MG tablet Take 1 tablet (5 mg total) by mouth daily.   . ANDROGEL PUMP 20.25 MG/ACT (1.62%) GEL 4 pumps daily   . atorvastatin (LIPITOR) 80 MG tablet Take 80 mg by mouth daily.     [name] azelastine (ASTELIN) 137 MCG/SPRAY nasal spray 1 spray by Nasal route daily. Use in each nostril as directed    . CALCIUM-MAGNESIUM PO Take 15 mLs by mouth as needed.     . Canagliflozin (INVOKANA) 100 MG TABS Take 100 mg by mouth daily.   . Coenzyme Q10 (COQ10) 100 MG CAPS Take 100 mg by mouth daily.    . colchicine 0.6 MG tablet Take 0.6 mg by mouth daily. Alternating 1po every other day and 2 po every other day..   . COLLAGEN PO Take 6 g by mouth daily.     [name] desonide (DESOWEN) 0.05 % cream Apply 1 application topically as needed.     [name] ELIQUIS  5 MG TABS tablet Take 1 tablet by mouth 2 (two) times daily. 09/17/2013: Received Sig:   . fluticasone (FLONASE) 50 MCG/ACT nasal spray as directed. 09/17/2013: Received Sig:   . Fluticasone-Salmeterol (ADVAIR DISKUS) 250-50 MCG/DOSE AEPB Inhale 1 puff into the lungs every 12 (twelve) hours.     . furosemide (LASIX) 20 MG tablet Take 20 mg by mouth daily.     . hydrochlorothiazide (HYDRODIURIL) 25 MG tablet Take 25 mg by mouth daily.   . hydrocortisone (ANUSOL-HC) 2.5 % rectal cream Place 1 application rectally 2 (two) times daily.     [name] levothyroxine (SYNTHROID, LEVOTHROID) 50 MCG tablet Take 50 mcg by mouth daily.     . Loratadine (CLARITIN) 10 MG CAPS Take 1 capsule by mouth daily.     [name] losartan (COZAAR) 100 MG tablet 50 mg 2 (two) times daily.  12/31/2015: Received from: External Pharmacy  . LUMIGAN 0.01 % SOLN Place 1 drop into both eyes at bedtime.  11/26/2014: Received from: External Pharmacy Received Sig:   . metaxalone (SKELAXIN) 800 MG tablet Take 800 mg by mouth 3 (three) times daily as needed.     . metFORMIN (GLUCOPHAGE) 500 MG tablet Take 500 mg by mouth 2 (two) times daily with a meal.     . metoprolol (TOPROL-XL) 100 MG 24 hr tablet Take 100 mg by mouth 2 (two) times daily.     . montelukast (SINGULAIR) 10 MG tablet Take 20 mg by mouth at bedtime.    . NON FORMULARY cinquin  Cinnamon & Chromium   . Nutritional Supplements (PYCNOGENOL PO) Take 50 mg by mouth daily.   . OMEGA-3 KRILL OIL 300 MG CAPS Take 1 capsule by mouth daily.     . Oral Electrolytes (SUSTAIN PO) Take by mouth.   . oxyCODONE (OXY IR/ROXICODONE) 5 MG immediate release tablet Take 2 tablets by mouth At bedtime.   [name] oxyCODONE-acetaminophen (ROXICET) 5-325 MG tablet Take 1 to 2 percocet as needed for pain   . Pancrelipase, Lip-Prot-Amyl, (CREON) 24000 UNITS CPEP Take by mouth 3 (three) times daily with meals.   . pantoprazole (PROTONIX) 40 MG tablet Take 40 mg by mouth 2 (two) times daily.     . potassium chloride SA (K-DUR,KLOR-CON) 20 MEQ tablet Take 20 mEq by mouth daily.     . Psyllium (HYDROCIL PO) Take 30 grams in 8 oz of water   . quiNINE (QUALAQUIN) 324 MG capsule Take 324 mg by mouth daily.     . Safflower Oil 1000 MG CAPS Take 1 capsule by mouth as needed.    . Salicylic Acid-Cleanser 6 % (LOTION) KIT Apply topically and occlude area at night   . Simethicone (GAS-X PO) Take 180 mg by mouth as needed.     [name] VICTOZA 18 MG/3ML SOPN  12/31/2015: Received from: External Pharmacy  . [DISCONTINUED] losartan (COZAAR) 50 MG tablet Take 1 tablet (50 mg total) by mouth daily.    No facility-administered encounter medications on file as of 12/31/2015.       Allergies:   Review of patient's allergies indicates no known allergies.   Social History   Social History  . Marital Status: Married    Spouse Name: N/A  . Number of Children: N/A  . Years of Education:  N/A   Occupational History  . Retired     Scientist, research (life sciences)   Social History Main Topics  . Smoking status: Former Smoker -- 1.00 packs/day for 26 years    Types: Cigarettes  Quit date: 11/20/1982  . Smokeless tobacco: None  . Alcohol Use: None  . Drug Use: None  . Sexual Activity: Not Asked   Other Topics Concern  . None   Social History Narrative     Family History:  The patient's family history includes Breast cancer in his mother; Emphysema in his father; Heart disease in his mother; Hypertension in his sister.   ROS:   Please see the history of present illness.    Review of Systems  Neurological: Positive for dizziness.  All other systems reviewed and are negative.   Physical Exam:    VS:  BP 130/64 mmHg  Pulse 57  Ht _0  (1.727 m)  Wt 235 lb (106.595 kg)  BMI 35.74 kg/m2   GEN: Well nourished, well developed, in no acute distress HEENT: normal Neck: no JVD, no masses Cardiac: Normal S1/S2, irreg irreg rhythm; no murmurs,   no edema    Respiratory: decreased BS bilaterally; no wheezing, rhonchi or rales GI: soft, nontender  MS: no deformity or atrophy Skin: warm and dry, no rash Neuro:  no focal deficits  Psych: Alert and oriented x 3, normal affect  Wt Readings from Last 3 Encounters:  12/31/15 235 lb (106.595 kg)  11/29/15 228 lb (103.42 kg)  11/05/15 232 lb (105.235 kg)      Studies/Labs Reviewed:     EKG:  EKG is  ordered today.  The ekg ordered today demonstrates AFib, HR 49, QTc 462 ms  Recent Labs: 11/25/2015: BUN 18; Creatinine, Ser 1.37*; Potassium 3.8; Sodium 139   Recent Lipid Panel No results found for: CHOL, TRIG, HDL, CHOLHDL, VLDL, LDLCALC, LDLDIRECT  Additional studies/ records that were reviewed today include:   Echo 11/26/15 EF 45-50%, diffuse HK, trivial AI, mild MR, severe LAE, moderate RVE, mildly reduced RVSF, moderate RAE, PASP 55 mmHg  LHC 8/11 Normal coronary arteries   ASSESSMENT:     1. Dilated  cardiomyopathy ([place])   2. Chronic systolic CHF (congestive heart failure) ([place])   3. Chronic atrial fibrillation ([place])   4. Essential hypertension   5. Hyperlipidemia     PLAN:     In order of problems listed above:  1. Dilated CM - EF on recent Echo 45-50%.  LHC in 2011 was neg for CAD.  Question related to HTN. BP is better.  Continue beta-blocker, angiotensin receptor blocker.  Will repeat BMET today.  I have asked him to DC the Verapamil.  We discussed the contraindication with reduced EF.  Consider repeat Echo in 3 mos.  If EF remains down or lower, consider FU stress testing.    2. Chronic Systolic CHF - Volume stable.  NYHA 2.  Continue current Rx.   3. Chronic AFib - Rate is slow today. He notes elevated HRs off of Verapamil.  CHADS2-VASc=5.  -  Continue Eliquis.  -  DC Verapamil  -  Increase Toprol to 150 QAM and 100 QPM (this should help keep his HR stable)   -  Check BMET, CBC  4. HTN - Controlled.   5. HL - Continue statin.  6. Prolonged QT - QTc ok today (462 ms)     Medication Adjustments/Labs and Tests Ordered: Current medicines are reviewed at length with the patient today.  Concerns regarding medicines are outlined above.  Medication changes, Labs and Tests ordered today are outlined in the Patient Instructions noted below. There are no Patient Instructions on file for this visit.  Signed, Richardson Dopp, PA-C  12/31/2015  9:14 AM    [facility] [address], [place], [place]  [address] Phone: [phone]; Fax: [phone]

## 2015-12-31 ENCOUNTER — Encounter: Payer: Self-pay | Admitting: Physician Assistant

## 2015-12-31 ENCOUNTER — Ambulatory Visit (INDEPENDENT_AMBULATORY_CARE_PROVIDER_SITE_OTHER): Payer: Medicare Other | Admitting: Physician Assistant

## 2015-12-31 VITALS — BP 130/64 | HR 57 | Ht 68.0 in | Wt 235.0 lb

## 2015-12-31 DIAGNOSIS — Z7901 Long term (current) use of anticoagulants: Secondary | ICD-10-CM | POA: Diagnosis not present

## 2015-12-31 DIAGNOSIS — E785 Hyperlipidemia, unspecified: Secondary | ICD-10-CM

## 2015-12-31 DIAGNOSIS — I482 Chronic atrial fibrillation, unspecified: Secondary | ICD-10-CM

## 2015-12-31 DIAGNOSIS — I10 Essential (primary) hypertension: Secondary | ICD-10-CM | POA: Diagnosis not present

## 2015-12-31 DIAGNOSIS — I4581 Long QT syndrome: Secondary | ICD-10-CM

## 2015-12-31 DIAGNOSIS — I420 Dilated cardiomyopathy: Secondary | ICD-10-CM | POA: Diagnosis not present

## 2015-12-31 DIAGNOSIS — I428 Other cardiomyopathies: Secondary | ICD-10-CM | POA: Diagnosis not present

## 2015-12-31 DIAGNOSIS — I4891 Unspecified atrial fibrillation: Secondary | ICD-10-CM | POA: Diagnosis not present

## 2015-12-31 DIAGNOSIS — R9431 Abnormal electrocardiogram [ECG] [EKG]: Secondary | ICD-10-CM

## 2015-12-31 DIAGNOSIS — I5022 Chronic systolic (congestive) heart failure: Secondary | ICD-10-CM

## 2015-12-31 DIAGNOSIS — I509 Heart failure, unspecified: Secondary | ICD-10-CM | POA: Diagnosis not present

## 2015-12-31 LAB — BASIC METABOLIC PANEL
BUN: 19 mg/dL (ref 7–25)
CHLORIDE: 99 mmol/L (ref 98–110)
CO2: 30 mmol/L (ref 20–31)
Calcium: 9.4 mg/dL (ref 8.6–10.3)
Creat: 1.47 mg/dL — ABNORMAL HIGH (ref 0.70–1.18)
Glucose, Bld: 211 mg/dL — ABNORMAL HIGH (ref 65–99)
Potassium: 3.6 mmol/L (ref 3.5–5.3)
SODIUM: 137 mmol/L (ref 135–146)

## 2015-12-31 LAB — CBC WITH DIFFERENTIAL/PLATELET
BASOS ABS: 0.1 10*3/uL (ref 0.0–0.1)
Basophils Relative: 1 % (ref 0–1)
EOS PCT: 2 % (ref 0–5)
Eosinophils Absolute: 0.2 10*3/uL (ref 0.0–0.7)
HEMATOCRIT: 47.4 % (ref 39.0–52.0)
Hemoglobin: 15.5 g/dL (ref 13.0–17.0)
LYMPHS ABS: 2.5 10*3/uL (ref 0.7–4.0)
LYMPHS PCT: 25 % (ref 12–46)
MCH: 27.6 pg (ref 26.0–34.0)
MCHC: 32.7 g/dL (ref 30.0–36.0)
MCV: 84.3 fL (ref 78.0–100.0)
MPV: 10.6 fL (ref 8.6–12.4)
Monocytes Absolute: 0.7 10*3/uL (ref 0.1–1.0)
Monocytes Relative: 7 % (ref 3–12)
NEUTROS ABS: 6.4 10*3/uL (ref 1.7–7.7)
NEUTROS PCT: 65 % (ref 43–77)
PLATELETS: 176 10*3/uL (ref 150–400)
RBC: 5.62 MIL/uL (ref 4.22–5.81)
RDW: 15.9 % — AB (ref 11.5–15.5)
WBC: 9.8 10*3/uL (ref 4.0–10.5)

## 2015-12-31 MED ORDER — METOPROLOL SUCCINATE ER 100 MG PO TB24: 100.0000 mg | ORAL_TABLET | ORAL | Status: DC

## 2015-12-31 MED ORDER — AMLODIPINE BESYLATE 5 MG PO TABS: 5.0000 mg | ORAL_TABLET | Freq: Every day | ORAL | Status: DC

## 2015-12-31 NOTE — Patient Instructions (Signed)
Medication Instructions:  1. STOP THE VERAPAMIL  2. RESTART THE NORVASC 5 MG DAILY  3. INCREASE TOPROL XL 01/01/16 TO 150 MG IN THE MORNING AND 100 MG IN THE PM  Labwork: TODAY BMET, CBC W/DIFF  Testing/Procedures: NONE  Follow-Up: 02/07/16 @ 8:30 WITH SCOTT WEAVER, PAC   Any Other Special Instructions Will Be Listed Below (If Applicable).  If you need a refill on your cardiac medications before your next appointment, please call your pharmacy.

## 2016-01-03 ENCOUNTER — Telehealth: Payer: Self-pay | Admitting: *Deleted

## 2016-01-03 NOTE — Telephone Encounter (Signed)
Lmptcb for lab results 

## 2016-01-04 NOTE — Telephone Encounter (Signed)
Lmptcb for lab results 

## 2016-01-05 ENCOUNTER — Encounter: Payer: Self-pay | Admitting: *Deleted

## 2016-01-05 DIAGNOSIS — H02839 Dermatochalasis of unspecified eye, unspecified eyelid: Secondary | ICD-10-CM | POA: Diagnosis not present

## 2016-01-05 DIAGNOSIS — H401222 Low-tension glaucoma, left eye, moderate stage: Secondary | ICD-10-CM | POA: Diagnosis not present

## 2016-01-05 DIAGNOSIS — H04123 Dry eye syndrome of bilateral lacrimal glands: Secondary | ICD-10-CM | POA: Diagnosis not present

## 2016-01-05 DIAGNOSIS — H02403 Unspecified ptosis of bilateral eyelids: Secondary | ICD-10-CM | POA: Diagnosis not present

## 2016-01-05 DIAGNOSIS — H401212 Low-tension glaucoma, right eye, moderate stage: Secondary | ICD-10-CM | POA: Diagnosis not present

## 2016-01-05 DIAGNOSIS — H01003 Unspecified blepharitis right eye, unspecified eyelid: Secondary | ICD-10-CM | POA: Diagnosis not present

## 2016-01-05 NOTE — Telephone Encounter (Signed)
Lmptcb x 3 

## 2016-01-18 ENCOUNTER — Telehealth: Payer: Self-pay | Admitting: Physician Assistant

## 2016-01-18 NOTE — Telephone Encounter (Signed)
Pt and wife were notified today of lab results from 2/10 by phone with verbal understanding.

## 2016-01-18 NOTE — Telephone Encounter (Signed)
Please call,pt received a letter saying he need to call so his test could be reviewed.

## 2016-02-06 NOTE — Progress Notes (Signed)
Cardiology Office Note:    Date:  02/07/2016   ID:  Colton Brooks, DOB 08/29/1941, MRN [id number]  PCP:  Marton Redwood, MD  Cardiologist:  Dr. Ena Dawley   Electrophysiologist:  n/a  Chief Complaint  Patient presents with  . Cardiomyopathy    follow up     History of Present Illness:     Colton Brooks is a 75 y.o. male with a hx of chronic AFib, bradycardia, OSA, HTN, HL, DM2.  LHC in 2011 demonstrated no CAD.  Last seen by Dr. Ena Dawley 12/16 for DOE.  QTc was prolonged (494 - prior was 516 ms).  Echo was arranged.  Echo 11/26/15 demonstrated EF 45-50%, BAE, PASP 55 mmHg.  Verapamil and Quinapril were DC'd and he was placed on Losartan and Amlodipine (can titrate to Losartan 100 and Amlodipine 10 if needed).  I saw him 12/31/15 for follow-up. He had just undergone umbilical hernia repair in January. He had run out of his amlodipine. He noted higher heart rates. He resumed his verapamil. I again took him off of verapamil. I increased his beta blocker for better heart rate control. He returns for follow-up.  Here with his wife. Overall, he is doing fairly well. He still has some dyspnea with exertion. He denies chest discomfort. Sleeps on 2 pillows. Denies PND. Denies edema. Denies syncope. He's had less palpitations on higher dose beta blocker.   Past Medical History  Diagnosis Date  . Allergic rhinitis, cause unspecified   . Asthma   . AF (atrial fibrillation) ([place])   . OSA on CPAP   . Type II or unspecified type diabetes mellitus without mention of complication, not stated as uncontrolled     Past Surgical History  Procedure Laterality Date  . Knee arthroscopy    . Tonsillectomy    . Cardiac catheterization    . Umbilical hernia repair N/A 11/29/2015    Procedure: HERNIA REPAIR UMBILICAL ADULT WITH MESH;  Surgeon: Colton Keens, MD;  Location: [facility];  Service: General;  Laterality: N/A;  . Insertion of mesh N/A 11/29/2015    Procedure:  INSERTION OF MESH;  Surgeon: Colton Keens, MD;  Location: [facility];  Service: General;  Laterality: N/A;    Current Medications: Outpatient Encounter Prescriptions as of 02/07/2016  Medication Sig Note  . Alpha-D-Galactosidase (BEANO) TABS Take 1 tablet by mouth as needed (FOR GAS).    [name] amLODipine (NORVASC) 5 MG tablet Take 1 tablet (5 mg total) by mouth 2 (two) times daily.   . ANDROGEL PUMP 20.25 MG/ACT (1.62%) GEL 4 pumps daily   . atorvastatin (LIPITOR) 80 MG tablet Take 80 mg by mouth daily.     [name] azelastine (ASTELIN) 137 MCG/SPRAY nasal spray 1 spray by Nasal route daily. Use in each nostril as directed    . CALCIUM-MAGNESIUM PO Take 15 mLs by mouth as needed (CONSTIPATION).    . Canagliflozin (INVOKANA) 100 MG TABS Take 100 mg by mouth daily.   . Coenzyme Q10 (COQ10) 100 MG CAPS Take 100 mg by mouth daily.    . colchicine 0.6 MG tablet Take 0.6 mg by mouth daily as needed (GOUT). Alternating 1po every other day and 2 po every other day..   . COLLAGEN PO Take 6 g by mouth daily.     [name] desonide (DESOWEN) 0.05 % cream Apply 1 application topically daily as needed (SKIN IRRITATION).    [name] ELIQUIS 5 MG TABS tablet Take 1 tablet by  mouth 2 (two) times daily. 09/17/2013: Received Sig:   . furosemide (LASIX) 20 MG tablet Take 20 mg by mouth daily.     . hydrochlorothiazide (HYDRODIURIL) 25 MG tablet Take 25 mg by mouth daily.   . hydrocortisone (ANUSOL-HC) 2.5 % rectal cream Place 1 application rectally 2 (two) times daily as needed for hemorrhoids.    [name] levothyroxine (SYNTHROID, LEVOTHROID) 50 MCG tablet Take 50 mcg by mouth daily.     . Loratadine (CLARITIN) 10 MG CAPS Take 1 capsule by mouth daily.     [name] losartan (COZAAR) 100 MG tablet 50 mg 2 (two) times daily.  12/31/2015: Received from: External Pharmacy  . LUMIGAN 0.01 % SOLN Place 1 drop into both eyes at bedtime. 11/26/2014: Received from: External Pharmacy Received Sig:   . metaxalone (SKELAXIN) 800 MG tablet Take  800 mg by mouth 3 (three) times daily as needed for muscle spasms.    . metFORMIN (GLUCOPHAGE) 500 MG tablet Take 1,000 mg by mouth 2 (two) times daily with a meal.    . metoprolol succinate (TOPROL-XL) 100 MG 24 hr tablet Take 1 tablet (100 mg total) by mouth as directed. Take 150 mg in the AM and 100 mg in the PM   . montelukast (SINGULAIR) 10 MG tablet Take 20 mg by mouth at bedtime.    . Nutritional Supplements (PYCNOGENOL PO) Take 50 mg by mouth daily.   . OMEGA-3 KRILL OIL 300 MG CAPS Take 1 capsule by mouth daily.     . Pancrelipase, Lip-Prot-Amyl, (CREON) 24000 UNITS CPEP Take by mouth 3 (three) times daily with meals.   . pantoprazole (PROTONIX) 40 MG tablet Take 40 mg by mouth 2 (two) times daily.     . potassium chloride SA (K-DUR,KLOR-CON) 20 MEQ tablet Take 20 mEq by mouth daily.     . Psyllium (HYDROCIL PO) Take 30 grams in 8 oz of water   . quiNINE (QUALAQUIN) 324 MG capsule Take 324 mg by mouth daily.     . Safflower Oil 1000 MG CAPS Take 1 capsule by mouth as needed (GOUT).    . Salicylic Acid-Cleanser 6 % (LOTION) KIT Apply topically and occlude area at night   . Simethicone (GAS-X PO) Take 180 mg by mouth as needed.     [name] VICTOZA 18 MG/3ML SOPN Inject 18 mg as directed daily.  12/31/2015: Received from: External Pharmacy  . [DISCONTINUED] amLODipine (NORVASC) 5 MG tablet Take 1 tablet (5 mg total) by mouth daily.   . [DISCONTINUED] oxyCODONE (OXY IR/ROXICODONE) 5 MG immediate release tablet Take 2 tablets by mouth At bedtime. Reported on 02/07/2016   . [DISCONTINUED] fluticasone (FLONASE) 50 MCG/ACT nasal spray Place 2 sprays into both nostrils once a week. Reported on 02/07/2016 09/17/2013: Received Sig:   . [DISCONTINUED] Fluticasone-Salmeterol (ADVAIR DISKUS) 250-50 MCG/DOSE AEPB Inhale 1 puff into the lungs every 12 (twelve) hours.     . [DISCONTINUED] NON FORMULARY Reported on 02/07/2016   . [DISCONTINUED] Oral Electrolytes (SUSTAIN PO) Take by mouth. Reported on 02/07/2016     . [DISCONTINUED] oxyCODONE-acetaminophen (ROXICET) 5-325 MG tablet Take 1 to 2 percocet as needed for pain (Patient not taking: Reported on 02/07/2016)    No facility-administered encounter medications on file as of 02/07/2016.       Allergies:   Review of patient's allergies indicates no known allergies.   Social History   Social History  . Marital Status: Married    Spouse Name: N/A  . Number of Children: N/A  .  Years of Education: N/A   Occupational History  . Retired     Scientist, research (life sciences)   Social History Main Topics  . Smoking status: Former Smoker -- 1.00 packs/day for 26 years    Types: Cigarettes    Quit date: 11/20/1982  . Smokeless tobacco: None  . Alcohol Use: None  . Drug Use: None  . Sexual Activity: Not Asked   Other Topics Concern  . None   Social History Narrative     Family History:  The patient's family history includes Breast cancer in his mother; Emphysema in his father; Heart disease in his mother; Hypertension in his sister.   ROS:   Please see the history of present illness.    Review of Systems  Cardiovascular: Positive for dyspnea on exertion and irregular heartbeat.  Respiratory: Positive for shortness of breath.   Musculoskeletal: Positive for joint swelling.  Gastrointestinal: Positive for abdominal pain, diarrhea and nausea.  Neurological: Positive for loss of balance.  All other systems reviewed and are negative.   Physical Exam:    VS:  BP 140/70 mmHg  Pulse 62  Ht _0  (1.753 m)  Wt 238 lb 6.4 oz (108.138 kg)  BMI 35.19 kg/m2  SpO2 98%   GEN: Well nourished, well developed, in no acute distress HEENT: normal Neck: no JVD, no masses Cardiac: Normal S1/S2, irreg irreg rhythm; no murmurs,   no edema    Respiratory: decreased BS bilaterally; no wheezing, rhonchi or rales GI: soft, nontender  MS: no deformity or atrophy Skin: warm and dry, no rash Neuro:  no focal deficits  Psych: Alert and oriented x 3, normal  affect  Wt Readings from Last 3 Encounters:  02/07/16 238 lb 6.4 oz (108.138 kg)  12/31/15 235 lb (106.595 kg)  11/29/15 228 lb (103.42 kg)      Studies/Labs Reviewed:     EKG:  EKG is  ordered today.  The ekg ordered today demonstrates AFib, HR 61, QTc 440 ms  Recent Labs: 12/31/2015: BUN 19; Creat 1.47*; Hemoglobin 15.5; Platelets 176; Potassium 3.6; Sodium 137   Recent Lipid Panel No results found for: CHOL, TRIG, HDL, CHOLHDL, VLDL, LDLCALC, LDLDIRECT  Additional studies/ records that were reviewed today include:   Echo 11/26/15 EF 45-50%, diffuse HK, trivial AI, mild MR, severe LAE, moderate RVE, mildly reduced RVSF, moderate RAE, PASP 55 mmHg  LHC 8/11 Normal coronary arteries   ASSESSMENT:     1. Dilated cardiomyopathy ([place])   2. Chronic systolic CHF (congestive heart failure) ([place])   3. Chronic atrial fibrillation ([place])   4. Essential hypertension   5. Prolonged Q-T interval on ECG     PLAN:     In order of problems listed above:  1. Dilated CM - EF on recent Echo 45-50%.  LHC in 2011 was neg for CAD.  Question related to HTN.  Continue beta-blocker, angiotensin receptor blocker.  Consider repeat Echo in 2 mos.  If EF remains down or lower, consider FU stress testing.     2. Chronic Systolic CHF - Volume stable.  NYHA 2.  Continue current Rx.   3. Chronic AFib - HR controlled. Recent Creatinine and Hgb stable.  CHADS2-VASc=5.  -  Continue Eliquis.  -  Continue Toprol to 150 QAM and 100 QPM (this should help keep his HR stable)   4. HTN - Borderline control. Increase amlodipine to 5 mg twice a day.  5. Prolonged QT - QTc today okay.     Medication Adjustments/Labs  and Tests Ordered: Current medicines are reviewed at length with the patient today.  Concerns regarding medicines are outlined above.  Medication changes, Labs and Tests ordered today are outlined in the Patient Instructions noted below. Patient Instructions  Medication Instructions:  Increase  Norvasc (Amlodipine) to 5 mg Twice daily  Take your Synthroid first thing in the morning on an empty stomach with water (before any other medications). Wait an hour after you take it before eating.   Labwork: None   Testing/Procedures: None   Follow-Up: Dr. Ena Dawley in 2-3 months.   Any Other Special Instructions Will Be Listed Below (If Applicable).  If you need a refill on your cardiac medications before your next appointment, please call your pharmacy.     Signed, Colton Dopp, PA-C  02/07/2016 9:24 AM    [facility] [place], [place], [place]  [address] Phone: [phone]; Fax: [phone]

## 2016-02-07 ENCOUNTER — Ambulatory Visit (INDEPENDENT_AMBULATORY_CARE_PROVIDER_SITE_OTHER): Payer: Medicare Other | Admitting: Physician Assistant

## 2016-02-07 ENCOUNTER — Encounter: Payer: Self-pay | Admitting: Physician Assistant

## 2016-02-07 VITALS — BP 140/70 | HR 62 | Ht 69.0 in | Wt 238.4 lb

## 2016-02-07 DIAGNOSIS — I482 Chronic atrial fibrillation, unspecified: Secondary | ICD-10-CM

## 2016-02-07 DIAGNOSIS — R9431 Abnormal electrocardiogram [ECG] [EKG]: Secondary | ICD-10-CM

## 2016-02-07 DIAGNOSIS — I10 Essential (primary) hypertension: Secondary | ICD-10-CM | POA: Diagnosis not present

## 2016-02-07 DIAGNOSIS — I5022 Chronic systolic (congestive) heart failure: Secondary | ICD-10-CM | POA: Diagnosis not present

## 2016-02-07 DIAGNOSIS — I420 Dilated cardiomyopathy: Secondary | ICD-10-CM | POA: Diagnosis not present

## 2016-02-07 DIAGNOSIS — I4581 Long QT syndrome: Secondary | ICD-10-CM

## 2016-02-07 MED ORDER — AMLODIPINE BESYLATE 5 MG PO TABS: 5.0000 mg | ORAL_TABLET | Freq: Two times a day (BID) | ORAL | Status: DC

## 2016-02-07 NOTE — Patient Instructions (Signed)
Medication Instructions:  Increase Norvasc (Amlodipine) to 5 mg Twice daily  Take your Synthroid first thing in the morning on an empty stomach with water (before any other medications). Wait an hour after you take it before eating.   Labwork: None   Testing/Procedures: None   Follow-Up: Dr. Ena Dawley in 2-3 months.   Any Other Special Instructions Will Be Listed Below (If Applicable).  If you need a refill on your cardiac medications before your next appointment, please call your pharmacy.

## 2016-02-08 ENCOUNTER — Other Ambulatory Visit: Payer: Self-pay | Admitting: Cardiology

## 2016-03-08 ENCOUNTER — Encounter: Payer: Self-pay | Admitting: Internal Medicine

## 2016-03-08 ENCOUNTER — Ambulatory Visit (INDEPENDENT_AMBULATORY_CARE_PROVIDER_SITE_OTHER): Payer: Medicare Other | Admitting: Internal Medicine

## 2016-03-08 VITALS — BP 122/78 | HR 57 | Ht 68.75 in | Wt 289.2 lb

## 2016-03-08 DIAGNOSIS — J4540 Moderate persistent asthma, uncomplicated: Secondary | ICD-10-CM

## 2016-03-08 DIAGNOSIS — G4733 Obstructive sleep apnea (adult) (pediatric): Secondary | ICD-10-CM | POA: Diagnosis not present

## 2016-03-08 DIAGNOSIS — J45998 Other asthma: Secondary | ICD-10-CM | POA: Diagnosis not present

## 2016-03-08 DIAGNOSIS — I482 Chronic atrial fibrillation, unspecified: Secondary | ICD-10-CM

## 2016-03-08 MED ORDER — FLUTICASONE FUROATE-VILANTEROL 100-25 MCG/INH IN AEPB: 1.0000 | INHALATION_SPRAY | Freq: Every day | RESPIRATORY_TRACT | Status: DC

## 2016-03-08 NOTE — Progress Notes (Signed)
Subjective:    Patient ID: Colton Brooks, male    DOB: [birth year], 75 y.o.   MRN: [id number]  03/22/11- HPI 47 [name] followed for dyspnea, OSA, allergic rhinitis, asthma, complicated by DM-II, chronic AFib. Last here- September 22, 2010- note reviewed. No more episodes of dyspnea. Got through right TKR with no respiratory problems.  CPAP remains good, but got off it for a bit while recuperating from knee. If he gets up at 4 AM he won't bother putting it back on. Pressure 12. We talked about alternatives including oral appliances. He is interested in learning more about oral appliances for especially during frequent travel.   09/27/11- [name] followed for dyspnea, OSA, allergic rhinitis, asthma, complicated by DM-II, chronic AFib. He has had flu vaccine. He did go to Dr. Ron Parker and was fitted for an oral appliance (TAP) which she uses an instead of CPAP when traveling because it is more portable. He sleeps better with CPAP. If his breathing is bad because of a cold for asthma, he can't wear CPAP because he can't cough into the mask. He has noted more nocturia with his oral appliance and I discussed that as suggesting his blood pressure goes higher during the night when he is wearing it, leading to renal compensation. Now for 4-5 weeks he has had sinus congestion and otitis with hearing loss. He was evaluated by Dr. Johnnette Gourd and given antibiotic by Dr. Brigitte Pulse. He stays on Claritin, nasal spray and daily saline nasal rinse. Because of his atrial fibrillation he avoids Sudafed. He tolerates Advair twice a day for his asthma and uses a rescue inhaler about twice daily. Arthritis limits walking more than his breathing does, especially in cold weather. CPAP is usually comfortable. He asks for a marginal increase from 11.6-12 CWP. Compliance and control are good.  03/26/12- [name] followed for dyspnea, OSA, allergic rhinitis, asthma, complicated by DM-II, chronic AFib. PCP Dr Lang Snow Wears CPAP every night when  home; otherwise uses oral appliance from Dr Oneal Grout when traveling; Recently had sinus infection and cold. CPAP machine/12/[facility] is an old machine. He had sinusitis with some blood in nasal discharge treated by Dr. Redmond Baseman successfully with antibiotic ? Augmentin.  11/26/12- [name] followed for dyspnea, OSA, allergic rhinitis, asthma, complicated by DM-II, chronic AFib. PCP Dr Lang Snow FOLLOWS FOR: wears CPAP12/ [facility] every night for about 5-7 hours(as long as he is sleeping); pressure is working well for patient. Doing well with CPAP all night every night Occasionally gets very short of breath for 2 or 3 days until he diureses spontaneously. During these times he will sleep in a chair.  05/27/13- [name] followed for dyspnea, OSA, allergic rhinitis, asthma, complicated by DM-II, chronic AFib. FOLLOWS FOR: 6 month follow up - reports sleeping well.  reports wears CPAP 12/ [facility]  99% of the time.  no new supplies needed at this time. Celebrex stopped because it made his chest tight. Activity limited by arthritis pain. Dyspnea associated with deconditioning and overweight. He does remain chronic atrial fibrillation. CXR 03/29/12 IMPRESSION:  1. No acute cardiopulmonary abnormalities.  Original Report Authenticated By: Angelita Ingles, M.D.  11/26/13- [name] followed for dyspnea, OSA, allergic rhinitis, asthma, complicated by DM-II, chronic AFib, gout follows for- wears CPAP 12/ [facility] nightly, X5-5.5 hours/night.  Pt denies any problems with his new machine or supplies. Has established with a rheumatologist for his arthritis/gout. Continues CPAP at 12 with good compliance and control, but he would like  to try a small pressure increased for comparison.  11/26/14- 10 [name] former smoker followed for dyspnea, OSA, allergic rhinitis, asthma, complicated by DM-II, chronic AFib, gout CPAP 13/ [facility] FOLLOWS FOR:  Wears CPAP 13/ A[facility]  nightly. Denies problems with mask/pressure. Pt states that his  humidifier is not working as it should be every night. Pt reports drier mouth than normal. Office Spirometry 11/26/14- Mild obstructive airways disease has not been using Advair but carries rescue inhaler still. Wheeze only when he has a cold  03/08/2016-89 year old M former smoker followed for dyspnea, OSA, allergic rhinitis, asthma, complicated by DM 2, chronic A. fib, gout CPAP 13/[facility] FOLLOWS FOR: DME [facility]; DL attached. Pt states he wears CPAP almost every night for about 4-5 hours. No supplies needed at this time. Download okay with CPAP at 13. We discussed benefit of taking CPAP machine with him when he travels and availability of smaller portable machines.  Review of Systems-See HPI Constitutional:   No-   weight loss, night sweats, fevers, chills, fatigue, lassitude. HEENT:   No-  headaches, difficulty swallowing, tooth/dental problems, sore throat,       No-  sneezing, itching, ear ache,  no  -nasal congestion, post nasal drip,  CV:  No-   chest pain, orthopnea, PND, swelling in lower extremities, anasarca, dizziness, palpitations Resp: +shortness of breath with exertion.              No-   productive cough,  No non-productive cough,  No- coughing up of blood.              No-   change in color of mucus.  No- wheezing.   Skin: No-   rash or lesions. GI:  No-   heartburn, indigestion, abdominal pain, nausea, vomiting,  GU: No-   dysuria,  MS:  +Chronic joint pain or swelling.   Neuro-     nothing unusual Psych:  No- change in mood or affect. No depression or anxiety.  No memory loss.   Objective:   Physical Exam  General- Alert, Oriented, Affect-appropriate, Distress- none acute; overweight Skin- rash-none, lesions- none, excoriation- none Lymphadenopathy- none Head- atraumatic            Eyes- Gross vision intact, PERRLA, conjunctivae clear secretions            Ears- Hearing, canals-normal            Nose- Clear, no-Septal dev, mucus, polyps, erosion,  perforation             Throat- Mallampati III-IV , mucosa clear , drainage- none, tonsils- atrophic Neck- flexible , trachea midline, no stridor , thyroid nl, carotid no bruit Chest - symmetrical excursion , unlabored           Heart/CV- +nearly regular pulse/ Atrial fib , no murmur , no gallop  , no rub, nl s1 s2                           - JVD- none , edema-+2, stasis changes-+, varices- none           Lung- clear to P&A, wheeze- none, cough- none , dullness-none, rub- none           Chest wall-  Abd-  Br/ Gen/ Rectal- Not done, not indicated Extrem- cyanosis- none, clubbing, none, atrophy- none, strength- nl, +cane Neuro- grossly intact to observation

## 2016-03-08 NOTE — Patient Instructions (Signed)
We can continue CPAP 13/ [facility]   Try sample Breo 100 maintenance inhaler   Inhale 1 puff then rinse mouth, once daily   See how this compares with Advair. Script printed if you want to change

## 2016-03-08 NOTE — Progress Notes (Signed)
  Patient ID: Colton Brooks, male   DOB: 1941-03-28, 75 y.o.   MRN: [id number]   Breo 100 Inhaler teaching at today's visit. Pt demonstrated understanding and repeated each step of teaching back to me. Blair Hailey

## 2016-03-11 NOTE — Assessment & Plan Note (Signed)
Well-controlled ventricular response rate by palpation, without pacemaker

## 2016-03-11 NOTE — Assessment & Plan Note (Signed)
Asthma control has been good. We discussed alternatives to Advair which she is not using consistently. Try Adair Patter

## 2016-03-11 NOTE — Assessment & Plan Note (Signed)
Adequate compliance but could be better. Control is good. Education and goals discussed.

## 2016-05-09 ENCOUNTER — Encounter: Payer: Self-pay | Admitting: Cardiology

## 2016-05-09 ENCOUNTER — Ambulatory Visit (INDEPENDENT_AMBULATORY_CARE_PROVIDER_SITE_OTHER): Payer: Medicare Other | Admitting: Cardiology

## 2016-05-09 VITALS — BP 120/68 | HR 64 | Ht 68.75 in | Wt 233.0 lb

## 2016-05-09 DIAGNOSIS — I482 Chronic atrial fibrillation, unspecified: Secondary | ICD-10-CM

## 2016-05-09 DIAGNOSIS — R0600 Dyspnea, unspecified: Secondary | ICD-10-CM

## 2016-05-09 DIAGNOSIS — I10 Essential (primary) hypertension: Secondary | ICD-10-CM | POA: Diagnosis not present

## 2016-05-09 DIAGNOSIS — I4581 Long QT syndrome: Secondary | ICD-10-CM

## 2016-05-09 DIAGNOSIS — I420 Dilated cardiomyopathy: Secondary | ICD-10-CM | POA: Diagnosis not present

## 2016-05-09 DIAGNOSIS — Z7901 Long term (current) use of anticoagulants: Secondary | ICD-10-CM

## 2016-05-09 DIAGNOSIS — E785 Hyperlipidemia, unspecified: Secondary | ICD-10-CM

## 2016-05-09 DIAGNOSIS — R9431 Abnormal electrocardiogram [ECG] [EKG]: Secondary | ICD-10-CM

## 2016-05-09 DIAGNOSIS — I5022 Chronic systolic (congestive) heart failure: Secondary | ICD-10-CM

## 2016-05-09 DIAGNOSIS — R0609 Other forms of dyspnea: Secondary | ICD-10-CM

## 2016-05-09 NOTE — Progress Notes (Signed)
Cardiology Office Note:    Date:  05/09/2016   ID:  Colton Brooks, DOB 1941-03-04, MRN [id number]  PCP:  Marton Redwood, MD  Cardiologist:  Dr. Ena Dawley   Electrophysiologist:  n/a  Chief complaint: Three-month follow-up for nonischemic cardiomyopathy.  History of Present Illness:     Colton Brooks is a 75 y.o. male with a hx of chronic AFib, bradycardia, OSA, HTN, HL, DM2.  LHC in 2011 demonstrated no CAD.  Last seen by Dr. Ena Dawley 12/16 for DOE.  QTc was prolonged (494 - prior was 516 ms).  Echo was arranged.  Echo 11/26/15 demonstrated EF 45-50%, BAE, PASP 55 mmHg.  Verapamil and Quinapril were DC'd and he was placed on Losartan and Amlodipine (can titrate to Losartan 100 and Amlodipine 10 if needed).  I saw him 12/31/15 for follow-up. He had just undergone umbilical hernia repair in January. He had run out of his amlodipine. He noted higher heart rates. He resumed his verapamil. I again took him off of verapamil. I increased his beta blocker for better heart rate control. He returns for follow-up.  05/09/2016  - the patient is coming with his wife, overall he feels very well, he is able to work 78 hours a day in his backyard without stopping. His only complaint is dizziness first half an hour in the morning but no syncope. He is compliant with his meds. He states that if he doesn't take his Lasix he starts feeling short of breath and developed lower extremity edema. He is scheduled to have his labs checked next week with his primary care physician. He sleeps with a sleep apnea machine and denies any orthopnea, no paroxysmal nocturnal dyspnea no palpitations. No chest pain no dyspnea on exertion. Denies any bleeding on Eliquis.   Past Medical History  Diagnosis Date  . Allergic rhinitis, cause unspecified   . Asthma   . AF (atrial fibrillation) ([place])   . OSA on CPAP   . Type II or unspecified type diabetes mellitus without mention of complication, not stated as  uncontrolled     Past Surgical History  Procedure Laterality Date  . Knee arthroscopy    . Tonsillectomy    . Cardiac catheterization    . Umbilical hernia repair N/A 11/29/2015    Procedure: HERNIA REPAIR UMBILICAL ADULT WITH MESH;  Surgeon: Coralie Keens, MD;  Location: [facility];  Service: General;  Laterality: N/A;  . Insertion of mesh N/A 11/29/2015    Procedure: INSERTION OF MESH;  Surgeon: Coralie Keens, MD;  Location: [facility];  Service: General;  Laterality: N/A;    Current Medications: Outpatient Encounter Prescriptions as of 05/09/2016  Medication Sig Note  . Alpha-D-Galactosidase (BEANO) TABS Take 1 tablet by mouth as needed (FOR GAS).    [name] amLODipine (NORVASC) 5 MG tablet Take 1 tablet (5 mg total) by mouth 2 (two) times daily.   . ANDROGEL PUMP 20.25 MG/ACT (1.62%) GEL 4 pumps daily   . atorvastatin (LIPITOR) 80 MG tablet Take 80 mg by mouth daily.     [name] azelastine (ASTELIN) 137 MCG/SPRAY nasal spray 1 spray by Nasal route daily. Use in each nostril as directed    . Canagliflozin (INVOKANA) 100 MG TABS Take 100 mg by mouth daily.   . Coenzyme Q10 (COQ10) 100 MG CAPS Take 100 mg by mouth daily.    . colchicine 0.6 MG tablet Take 0.6 mg by mouth daily as needed (GOUT). Alternating 1po every other  day and 2 po every other day..   . COLLAGEN PO Take 6 g by mouth daily.     [name] desonide (DESOWEN) 0.05 % cream Apply 1 application topically daily as needed (SKIN IRRITATION).    [name] ELIQUIS 5 MG TABS tablet Take 1 tablet by mouth 2 (two) times daily. 09/17/2013: Received Sig:   . fluticasone furoate-vilanterol (BREO ELLIPTA) 100-25 MCG/INH AEPB Inhale 1 puff into the lungs daily.   . furosemide (LASIX) 20 MG tablet Take 20 mg by mouth daily.     . hydrochlorothiazide (HYDRODIURIL) 25 MG tablet Take 25 mg by mouth daily.   . hydrocortisone (ANUSOL-HC) 2.5 % rectal cream Place 1 application rectally 2 (two) times daily as needed for hemorrhoids.    [name]  levothyroxine (SYNTHROID, LEVOTHROID) 50 MCG tablet Take 50 mcg by mouth daily.     . Loratadine (CLARITIN) 10 MG CAPS Take 1 capsule by mouth daily.     [name] losartan (COZAAR) 50 MG tablet Take 1 tablet (50 mg total) by mouth 2 (two) times daily.   [name] LUMIGAN 0.01 % SOLN Place 1 drop into both eyes at bedtime. 11/26/2014: Received from: External Pharmacy Received Sig:   . metaxalone (SKELAXIN) 800 MG tablet Take 800 mg by mouth 3 (three) times daily as needed for muscle spasms.    . metFORMIN (GLUCOPHAGE) 500 MG tablet Take 1,000 mg by mouth 2 (two) times daily with a meal.    . metoprolol succinate (TOPROL-XL) 100 MG 24 hr tablet Take 1 tablet (100 mg total) by mouth as directed. Take 150 mg in the AM and 100 mg in the PM   . montelukast (SINGULAIR) 10 MG tablet Take 20 mg by mouth at bedtime.    . Nutritional Supplements (PYCNOGENOL PO) Take 50 mg by mouth daily.   . OMEGA-3 KRILL OIL 300 MG CAPS Take 1 capsule by mouth daily.     . Pancrelipase, Lip-Prot-Amyl, (CREON) 24000 UNITS CPEP Take by mouth 3 (three) times daily with meals.   . pantoprazole (PROTONIX) 40 MG tablet Take 40 mg by mouth 2 (two) times daily.     . potassium chloride SA (K-DUR,KLOR-CON) 20 MEQ tablet Take 20 mEq by mouth daily.     . Psyllium (HYDROCIL PO) Take 30 grams in 8 oz of water   . quiNINE (QUALAQUIN) 324 MG capsule Take 324 mg by mouth daily.     . Safflower Oil 1000 MG CAPS Take 1 capsule by mouth as needed (GOUT).    . Salicylic Acid-Cleanser 6 % (LOTION) KIT Apply topically and occlude area at night   . Simethicone (GAS-X PO) Take 180 mg by mouth as needed.     [name] VICTOZA 18 MG/3ML SOPN Inject 18 mg as directed daily.  12/31/2015: Received from: External Pharmacy  . [DISCONTINUED] CALCIUM-MAGNESIUM PO Take 15 mLs by mouth as needed (CONSTIPATION). Reported on 05/09/2016   . [DISCONTINUED] fluticasone furoate-vilanterol (BREO ELLIPTA) 100-25 MCG/INH AEPB Inhale 1 puff into the lungs daily. (Patient not taking: Reported  on 05/09/2016)   . [DISCONTINUED] losartan (COZAAR) 100 MG tablet 50 mg 2 (two) times daily. Reported on 05/09/2016 12/31/2015: Received from: External Pharmacy   No facility-administered encounter medications on file as of 05/09/2016.       Allergies:   Review of patient's allergies indicates no known allergies.   Social History   Social History  . Marital Status: Married    Spouse Name: N/A  . Number of Children: N/A  . Years of Education:  N/A   Occupational History  . Retired     Scientist, research (life sciences)   Social History Main Topics  . Smoking status: Former Smoker -- 1.00 packs/day for 26 years    Types: Cigarettes    Quit date: 11/20/1982  . Smokeless tobacco: None  . Alcohol Use: None  . Drug Use: None  . Sexual Activity: Not Asked   Other Topics Concern  . None   Social History Narrative     Family History:  The patient's family history includes Breast cancer in his mother; Emphysema in his father; Heart disease in his mother; Hypertension in his sister.   ROS:   Please see the history of present illness.    Review of Systems  Cardiovascular: Positive for dyspnea on exertion and irregular heartbeat.  Respiratory: Positive for shortness of breath.   Musculoskeletal: Positive for joint swelling.  Gastrointestinal: Positive for abdominal pain, diarrhea and nausea.  Neurological: Positive for loss of balance.  All other systems reviewed and are negative.   Physical Exam:    VS:  BP 120/68 mmHg  Pulse 64  Ht 5' 8.75" (1.746 m)  Wt 233 lb (105.688 kg)  BMI 34.67 kg/m2  SpO2 96%   GEN: Well nourished, well developed, in no acute distress HEENT: normal Neck: no JVD, no masses Cardiac: Normal S1/S2, irreg irreg rhythm; no murmurs,   no edema    Respiratory: decreased BS bilaterally; no wheezing, rhonchi or rales GI: soft, nontender  MS: no deformity or atrophy Skin: warm and dry, no rash, chronic hyperpigmentation on both shins secondary to chronic venous  stasis. Good pulses bilaterally. Neuro:  no focal deficits  Psych: Alert and oriented x 3, normal affect  Wt Readings from Last 3 Encounters:  05/09/16 233 lb (105.688 kg)  03/08/16 289 lb 3.2 oz (131.18 kg)  02/07/16 238 lb 6.4 oz (108.138 kg)      Studies/Labs Reviewed:     EKG:  EKG is  ordered today.  The ekg ordered today demonstrates AFib, HR 61, QTc 440 ms  Recent Labs: 12/31/2015: BUN 19; Creat 1.47*; Hemoglobin 15.5; Platelets 176; Potassium 3.6; Sodium 137   Recent Lipid Panel No results found for: CHOL, TRIG, HDL, CHOLHDL, VLDL, LDLCALC, LDLDIRECT  Additional studies/ records that were reviewed today include:   Echo 11/26/15 EF 45-50%, diffuse HK, trivial AI, mild MR, severe LAE, moderate RVE, mildly reduced RVSF, moderate RAE, PASP 55 mmHg  LHC 8/11 Normal coronary arteries    ASSESSMENT:     1. Dilated cardiomyopathy ([place])   2. Chronic systolic CHF (congestive heart failure) ([place])   3. Chronic atrial fibrillation ([place])   4. Essential hypertension   5. Prolonged Q-T interval on ECG   6. Hyperlipidemia   7. Dyspnea on exertion   8. Anticoagulation adequate with anticoagulant therapy     PLAN:     In order of problems listed above:  1. Dilated CM - EF on recent Echo 45-50%.  LHC in 2011 was neg for CAD.  Question related to HTN.  Continue beta-blocker, angiotensin receptor blocker.  Consider repeat Echo in 2 mos.  We will repeat an echocardiogram now to reevaluate for his LVEF. He insisted on taking both Lasix and hydrochlorothiazide as otherwise he would develop lower extremity edema.  2. Chronic Systolic CHF - Volume stable.  NYHA 2.  Continue current Rx.   3. Chronic AFib - HR controlled. Recent Creatinine and Hgb stable.  CHADS2-VASc=5.  -  Continue Eliquis. No bleeding complication.  -  Continue Toprol to 150 QAM and 100 QPM (this should help keep his HR stable)   4. HTN - Borderline control. Increase amlodipine to 5 mg twice a day.  5. Prolonged  QT - QTc normal at the last visit..   Medication Adjustments/Labs and Tests Ordered: Current medicines are reviewed at length with the patient today.  Concerns regarding medicines are outlined above.  Medication changes, Labs and Tests ordered today are outlined in the Patient Instructions noted below. Patient Instructions  Medication Instructions:   Your physician recommends that you continue on your current medications as directed. Please refer to the Current Medication list given to you today.    Labwork:  DR Meda Coffee REQUEST THAT YOU HAVE YOUR PCP FAX [place] YOUR LAB RESULTS THAT WILL BE DONE TOMORROW AT HIS OFFICE---HAVE HIS NURSE FAX YOUR RESULTS TO OUR OFFICE AT [phone], ATTENTION IVY AND DR Meda Coffee.   Testing/Procedures:  Your physician has requested that you have an echocardiogram. Echocardiography is a painless test that uses sound waves to create images of your heart. It provides your doctor with information about the size and shape of your heart and how well your heart's chambers and valves are working. This procedure takes approximately one hour. There are no restrictions for this procedure.    Follow-Up:  Your physician wants you to follow-up in: [place] will receive a reminder letter in the mail two months in advance. If you don't receive a letter, please call our office to schedule the follow-up appointment.        If you need a refill on your cardiac medications before your next appointment, please call your pharmacy.      Signed, Ena Dawley, MD  05/09/2016 9:20 AM    [facility] [address], [place], [place]  [address] Phone: [phone]; Fax: [phone]

## 2016-05-09 NOTE — Patient Instructions (Signed)
Medication Instructions:   Your physician recommends that you continue on your current medications as directed. Please refer to the Current Medication list given to you today.    Labwork:  DR Meda Coffee REQUEST THAT YOU HAVE YOUR PCP FAX [place] YOUR LAB RESULTS THAT WILL BE DONE TOMORROW AT HIS OFFICE---HAVE HIS NURSE FAX YOUR RESULTS TO OUR OFFICE AT [phone], ATTENTION Jibreel [place] AND DR Meda Coffee.   Testing/Procedures:  Your physician has requested that you have an echocardiogram. Echocardiography is a painless test that uses sound waves to create images of your heart. It provides your doctor with information about the size and shape of your heart and how well your heart's chambers and valves are working. This procedure takes approximately one hour. There are no restrictions for this procedure.    Follow-Up:  Your physician wants you to follow-up in: [place] will receive a reminder letter in the mail two months in advance. If you don't receive a letter, please call our office to schedule the follow-up appointment.        If you need a refill on your cardiac medications before your next appointment, please call your pharmacy.

## 2016-05-15 DIAGNOSIS — E784 Other hyperlipidemia: Secondary | ICD-10-CM | POA: Diagnosis not present

## 2016-05-15 DIAGNOSIS — I10 Essential (primary) hypertension: Secondary | ICD-10-CM | POA: Diagnosis not present

## 2016-05-15 DIAGNOSIS — R8290 Unspecified abnormal findings in urine: Secondary | ICD-10-CM | POA: Diagnosis not present

## 2016-05-15 DIAGNOSIS — E1129 Type 2 diabetes mellitus with other diabetic kidney complication: Secondary | ICD-10-CM | POA: Diagnosis not present

## 2016-05-15 DIAGNOSIS — E291 Testicular hypofunction: Secondary | ICD-10-CM | POA: Diagnosis not present

## 2016-05-15 DIAGNOSIS — N390 Urinary tract infection, site not specified: Secondary | ICD-10-CM | POA: Diagnosis not present

## 2016-05-18 ENCOUNTER — Encounter: Payer: Self-pay | Admitting: Cardiology

## 2016-05-18 DIAGNOSIS — G4733 Obstructive sleep apnea (adult) (pediatric): Secondary | ICD-10-CM | POA: Diagnosis not present

## 2016-05-18 DIAGNOSIS — I480 Paroxysmal atrial fibrillation: Secondary | ICD-10-CM | POA: Diagnosis not present

## 2016-05-18 DIAGNOSIS — I428 Other cardiomyopathies: Secondary | ICD-10-CM | POA: Diagnosis not present

## 2016-05-18 DIAGNOSIS — I5022 Chronic systolic (congestive) heart failure: Secondary | ICD-10-CM | POA: Diagnosis not present

## 2016-05-18 DIAGNOSIS — I10 Essential (primary) hypertension: Secondary | ICD-10-CM | POA: Diagnosis not present

## 2016-05-18 DIAGNOSIS — Z0000 Encounter for general adult medical examination without abnormal findings: Secondary | ICD-10-CM | POA: Diagnosis not present

## 2016-05-18 DIAGNOSIS — E784 Other hyperlipidemia: Secondary | ICD-10-CM | POA: Diagnosis not present

## 2016-05-18 DIAGNOSIS — M109 Gout, unspecified: Secondary | ICD-10-CM | POA: Diagnosis not present

## 2016-05-18 DIAGNOSIS — E1129 Type 2 diabetes mellitus with other diabetic kidney complication: Secondary | ICD-10-CM | POA: Diagnosis not present

## 2016-05-18 DIAGNOSIS — Z1389 Encounter for screening for other disorder: Secondary | ICD-10-CM | POA: Diagnosis not present

## 2016-05-18 DIAGNOSIS — Z7901 Long term (current) use of anticoagulants: Secondary | ICD-10-CM | POA: Diagnosis not present

## 2016-05-18 DIAGNOSIS — J45909 Unspecified asthma, uncomplicated: Secondary | ICD-10-CM | POA: Diagnosis not present

## 2016-05-30 ENCOUNTER — Other Ambulatory Visit (HOSPITAL_COMMUNITY): Payer: Medicare Other

## 2016-06-06 ENCOUNTER — Ambulatory Visit (HOSPITAL_COMMUNITY): Payer: Medicare Other | Attending: Internal Medicine

## 2016-06-06 ENCOUNTER — Other Ambulatory Visit: Payer: Self-pay

## 2016-06-06 DIAGNOSIS — I358 Other nonrheumatic aortic valve disorders: Secondary | ICD-10-CM | POA: Insufficient documentation

## 2016-06-06 DIAGNOSIS — I340 Nonrheumatic mitral (valve) insufficiency: Secondary | ICD-10-CM | POA: Diagnosis not present

## 2016-06-06 DIAGNOSIS — G4733 Obstructive sleep apnea (adult) (pediatric): Secondary | ICD-10-CM | POA: Insufficient documentation

## 2016-06-06 DIAGNOSIS — I517 Cardiomegaly: Secondary | ICD-10-CM | POA: Insufficient documentation

## 2016-06-06 DIAGNOSIS — I482 Chronic atrial fibrillation: Secondary | ICD-10-CM | POA: Insufficient documentation

## 2016-06-06 DIAGNOSIS — I420 Dilated cardiomyopathy: Secondary | ICD-10-CM | POA: Diagnosis not present

## 2016-06-06 DIAGNOSIS — I071 Rheumatic tricuspid insufficiency: Secondary | ICD-10-CM | POA: Insufficient documentation

## 2016-06-07 ENCOUNTER — Telehealth: Payer: Self-pay | Admitting: Cardiology

## 2016-06-07 NOTE — Telephone Encounter (Signed)
Informed of echo results. Verbalized understanding.

## 2016-06-07 NOTE — Telephone Encounter (Signed)
New message      Returning a call to the nurse to get echo resuts

## 2016-07-05 DIAGNOSIS — E119 Type 2 diabetes mellitus without complications: Secondary | ICD-10-CM | POA: Diagnosis not present

## 2016-07-05 DIAGNOSIS — H35362 Drusen (degenerative) of macula, left eye: Secondary | ICD-10-CM | POA: Diagnosis not present

## 2016-07-05 DIAGNOSIS — H26493 Other secondary cataract, bilateral: Secondary | ICD-10-CM | POA: Diagnosis not present

## 2016-07-05 DIAGNOSIS — H401212 Low-tension glaucoma, right eye, moderate stage: Secondary | ICD-10-CM | POA: Diagnosis not present

## 2016-07-05 DIAGNOSIS — H401222 Low-tension glaucoma, left eye, moderate stage: Secondary | ICD-10-CM | POA: Diagnosis not present

## 2016-08-14 ENCOUNTER — Telehealth: Payer: Self-pay | Admitting: Internal Medicine

## 2016-08-14 MED ORDER — FLUTICASONE PROPIONATE 50 MCG/ACT NA SUSP: 2.0000 | Freq: Every day | NASAL | 3 refills | Status: DC

## 2016-08-14 MED ORDER — FLUTICASONE FUROATE-VILANTEROL 100-25 MCG/INH IN AEPB: 1.0000 | INHALATION_SPRAY | Freq: Every day | RESPIRATORY_TRACT | 3 refills | Status: DC

## 2016-08-14 NOTE — Telephone Encounter (Signed)
Called spoke with pt's wife. She states her husband needs refills of Breo [facility]. I explained to her that the medication would be sent today. She voiced understanding and had no further questions. Rx sent.

## 2016-08-22 DIAGNOSIS — Z96651 Presence of right artificial knee joint: Secondary | ICD-10-CM | POA: Diagnosis not present

## 2016-08-22 DIAGNOSIS — M1712 Unilateral primary osteoarthritis, left knee: Secondary | ICD-10-CM | POA: Diagnosis not present

## 2016-08-24 DIAGNOSIS — Z96651 Presence of right artificial knee joint: Secondary | ICD-10-CM | POA: Diagnosis not present

## 2016-08-24 DIAGNOSIS — Z471 Aftercare following joint replacement surgery: Secondary | ICD-10-CM | POA: Diagnosis not present

## 2016-08-24 DIAGNOSIS — M1712 Unilateral primary osteoarthritis, left knee: Secondary | ICD-10-CM | POA: Diagnosis not present

## 2016-08-28 DIAGNOSIS — M25562 Pain in left knee: Secondary | ICD-10-CM | POA: Diagnosis not present

## 2016-08-28 DIAGNOSIS — M25561 Pain in right knee: Secondary | ICD-10-CM | POA: Diagnosis not present

## 2016-08-29 DIAGNOSIS — M25562 Pain in left knee: Secondary | ICD-10-CM | POA: Diagnosis not present

## 2016-08-29 DIAGNOSIS — M25561 Pain in right knee: Secondary | ICD-10-CM | POA: Diagnosis not present

## 2016-08-30 DIAGNOSIS — E1129 Type 2 diabetes mellitus with other diabetic kidney complication: Secondary | ICD-10-CM | POA: Diagnosis not present

## 2016-08-30 DIAGNOSIS — Z6834 Body mass index (BMI) 34.0-34.9, adult: Secondary | ICD-10-CM | POA: Diagnosis not present

## 2016-09-11 ENCOUNTER — Encounter: Payer: Self-pay | Admitting: Cardiology

## 2016-09-14 ENCOUNTER — Encounter (INDEPENDENT_AMBULATORY_CARE_PROVIDER_SITE_OTHER): Payer: Self-pay

## 2016-09-14 ENCOUNTER — Encounter: Payer: Self-pay | Admitting: Cardiology

## 2016-09-14 ENCOUNTER — Ambulatory Visit (INDEPENDENT_AMBULATORY_CARE_PROVIDER_SITE_OTHER): Payer: Medicare Other | Admitting: Cardiology

## 2016-09-14 VITALS — BP 126/72 | HR 65 | Ht 69.0 in | Wt 231.0 lb

## 2016-09-14 DIAGNOSIS — Z01818 Encounter for other preprocedural examination: Secondary | ICD-10-CM

## 2016-09-14 NOTE — Progress Notes (Signed)
09/14/2016 Colton Brooks   [birth year]  [id number]  Primary Physician Marton Redwood, MD Primary Cardiologist: Dr. Meda Coffee   Reason for Visit/CC: Pre-operative Assessment/ Surgical Clearance   HPI:  Colton Brooks is a 75 y.o. male, followed by Dr. Meda Coffee, with a hx of chronic AFib, bradycardia, OSA on CPAP, HTN, HL and DM2.  LHC in 2011 demonstrated no CAD. His last 2D echo 06/06/16 demonstrated normal LVEF at 55-60%, moderate LVH. No WMA. There was aortic sclerosis w/o stenosis. Trivial MR was noted. Compared to his prior echo 11/2015, his EF was improved (previously 45-50%). He is on Eliquis for a/c given chronic atrial fibrillation.   He presents to clinic today for pre-operative assessment/ surgical clearance. He has right knee OA and is needing to undergo TKR by Dr. Wynelle Link. He denies any anginal symptomatology. He is able to ambulate up a flight of stairs w/o exertional CP or dyspnea. No resting CP. He denies syncope/ near syncope. He is asymptomatic with his atrial fibrillation and fully compliant with Eliquis.  No falls nor abnormal bleeding. He is fully compliant with his CPAP. EKG today shows chronic atrial fibrillation with a CVR of 59 bpm. No ST changes from prior EKG. BP is well controlled. He denies h/o CVA/TIA, PE/DVT.   Current Meds  Medication Sig  . Alpha-D-Galactosidase (BEANO) TABS Take 1 tablet by mouth as needed (FOR GAS).   [name] amLODipine (NORVASC) 5 MG tablet Take 1 tablet (5 mg total) by mouth 2 (two) times daily.  . ANDROGEL PUMP 20.25 MG/ACT (1.62%) GEL 4 pumps daily  . atorvastatin (LIPITOR) 80 MG tablet Take 80 mg by mouth daily.    [name] azelastine (ASTELIN) 137 MCG/SPRAY nasal spray 1 spray by Nasal route daily. Use in each nostril as directed   . Coenzyme Q10 (COQ10) 100 MG CAPS Take 100 mg by mouth daily.   . colchicine 0.6 MG tablet Take 0.6 mg by mouth daily as needed (GOUT). Alternating 1po every other day and 2 po every other day..  . COLLAGEN PO Take 6 g  by mouth daily.    [name] desonide (DESOWEN) 0.05 % cream Apply 1 application topically daily as needed (SKIN IRRITATION).   [name] ELIQUIS 5 MG TABS tablet Take 1 tablet by mouth 2 (two) times daily.  . fluticasone (FLONASE) 50 MCG/ACT nasal spray Place 2 sprays into both nostrils daily.  . fluticasone furoate-vilanterol (BREO ELLIPTA) 100-25 MCG/INH AEPB Inhale 1 puff into the lungs daily.  . furosemide (LASIX) 20 MG tablet Take 20 mg by mouth daily.    . hydrochlorothiazide (HYDRODIURIL) 25 MG tablet Take 25 mg by mouth daily.  . hydrocortisone (ANUSOL-HC) 2.5 % rectal cream Place 1 application rectally 2 (two) times daily as needed for hemorrhoids.   . insulin glargine (LANTUS) 100 UNIT/ML injection Inject 14 Units into the skin at bedtime.  [name] JARDIANCE 10 MG TABS tablet Take 10 mg by mouth daily.  [name] levothyroxine (SYNTHROID, LEVOTHROID) 50 MCG tablet Take 50 mcg by mouth daily.    . Loratadine (CLARITIN) 10 MG CAPS Take 1 capsule by mouth daily.    [name] losartan (COZAAR) 50 MG tablet Take 1 tablet (50 mg total) by mouth 2 (two) times daily.  [name] LUMIGAN 0.01 % SOLN Place 1 drop into both eyes at bedtime.  . metaxalone (SKELAXIN) 800 MG tablet Take 800 mg by mouth 3 (three) times daily as needed for muscle spasms.   . metFORMIN (GLUCOPHAGE) 500 MG tablet Take 1,000 mg by mouth  2 (two) times daily with a meal.   . metoprolol succinate (TOPROL-XL) 100 MG 24 hr tablet Take 1 tablet (100 mg total) by mouth as directed. Take 150 mg in the AM and 100 mg in the PM  . montelukast (SINGULAIR) 10 MG tablet Take 20 mg by mouth at bedtime.   . Nutritional Supplements (PYCNOGENOL PO) Take 50 mg by mouth daily.  . OMEGA-3 KRILL OIL 300 MG CAPS Take 1 capsule by mouth daily.    . Pancrelipase, Lip-Prot-Amyl, (CREON) 24000 UNITS CPEP Take by mouth 3 (three) times daily with meals.  . pantoprazole (PROTONIX) 40 MG tablet Take 40 mg by mouth 2 (two) times daily.    . potassium chloride SA (K-DUR,KLOR-CON) 20 MEQ tablet  Take 20 mEq by mouth daily.    . Psyllium (HYDROCIL PO) Take 30 grams in 8 oz of water  . quiNINE (QUALAQUIN) 324 MG capsule Take 324 mg by mouth daily.    . Safflower Oil 1000 MG CAPS Take 1 capsule by mouth as needed (GOUT).   . Salicylic Acid-Cleanser 6 % (LOTION) KIT Apply topically and occlude area at night  . Simethicone (GAS-X PO) Take 180 mg by mouth as needed.    [name] VICTOZA 18 MG/3ML SOPN Inject 18 mg as directed daily.    No Known Allergies Past Medical History:  Diagnosis Date  . AF (atrial fibrillation) ([place])   . Allergic rhinitis, cause unspecified   . Asthma   . OSA on CPAP   . Type II or unspecified type diabetes mellitus without mention of complication, not stated as uncontrolled    Family History  Problem Relation Age of Onset  . Heart disease Mother     died at 46 resp failure  . Breast cancer Mother   . Emphysema Father     died at 52 resp failure  . Hypertension Sister    Past Surgical History:  Procedure Laterality Date  . CARDIAC CATHETERIZATION    . INSERTION OF MESH N/A 11/29/2015   Procedure: INSERTION OF MESH;  Surgeon: Coralie Keens, MD;  Location: [facility];  Service: General;  Laterality: N/A;  . KNEE ARTHROSCOPY    . TONSILLECTOMY    . UMBILICAL HERNIA REPAIR N/A 11/29/2015   Procedure: HERNIA REPAIR UMBILICAL ADULT WITH MESH;  Surgeon: Coralie Keens, MD;  Location: [facility];  Service: General;  Laterality: N/A;   Social History   Social History  . Marital status: Married    Spouse name: N/A  . Number of children: N/A  . Years of education: N/A   Occupational History  . Retired     Scientist, research (life sciences)   Social History Main Topics  . Smoking status: Former Smoker    Packs/day: 1.00    Years: 26.00    Types: Cigarettes    Quit date: 11/20/1982  . Smokeless tobacco: Never Used  . Alcohol use No  . Drug use: No  . Sexual activity: Not on file   Other Topics Concern  . Not on file   Social  History Narrative  . No narrative on file     Review of Systems: General: negative for chills, fever, night sweats or weight changes.  Cardiovascular: negative for chest pain, dyspnea on exertion, edema, orthopnea, palpitations, paroxysmal nocturnal dyspnea or shortness of breath Dermatological: negative for rash Respiratory: negative for cough or wheezing Urologic: negative for hematuria Abdominal: negative for nausea, vomiting, diarrhea, bright red blood per rectum, melena, or hematemesis Neurologic: negative  for visual changes, syncope, or dizziness All other systems reviewed and are otherwise negative except as noted above.   Physical Exam:  Blood pressure 126/72, pulse 65, height _0  (1.753 m), weight 231 lb (104.8 kg).  General appearance: alert, cooperative and no distress Neck: no carotid bruit and no JVD Lungs: clear to auscultation bilaterally Heart: regular rate and rhythm, S1, S2 normal, no murmur, click, rub or gallop Extremities: no LEE, chronic bilateral venous stasis dermatitis  Pulses: 2+ and symmetric Skin: Skin color, texture, turgor normal. No rashes or lesions Neurologic: Grossly normal  EKG Chronic atrial fibrillation with CVR 59 bpm unchanged from previous   ASSESSMENT AND PLAN:   1. Chronic Atrial Fibrillation: asymptomatic. Rate is controlled with metoprolol. Continue Eliquis for A/C.   2. Chronic Diastolic CHF: most recent echo 05/2016 showed an EF of 55-60%. He is euvolemic on exam. No dyspnea. Continue diuretic and BP meds for BP control.   3. HTN: well controlled on current regimen.   4. DM: followed by PCP.   5. OSA: continue with CPAP nightly.   6. Rt Knee OA: plan is for right TKR  7. Pre-operative Assessment: patient with no known h/o CAD. LHC in 2011 showed no disease. He denies anginal symptomatology. He is able to ambulate a flight of stairs w/o exertional CP or dyspnea. EKG is unchanged from prior EKG. He is considered low risk for  cardiac complications from this low risk surgery. He may be cleared for surgery w/o need for additional cardiac testing. Ok to hold Eliquis 2 days prior to procedure. Resume once ok from a surgical standpoint. Continue BB during the perioperative period. Monitor on telemetry post op, given his chronic atrial fibrillation. Cardiology will be available  if needed post-op.    PLAN  F/u with Dr. Meda Coffee in 6 months.   Tyjuan Demetro PA-C 09/14/2016 10:02 AM

## 2016-09-14 NOTE — Patient Instructions (Addendum)
Your physician recommends that you continue on your current medications as directed. Please refer to the Current Medication list given to you today.     Your physician wants you to follow-up in: 6 MONTHS WITH DR NELSON You will receive a reminder letter in the mail two months in advance. If you don't receive a letter, please call our office to schedule the follow-up appointment.  

## 2016-09-15 ENCOUNTER — Ambulatory Visit: Payer: Self-pay | Admitting: Orthopedic Surgery

## 2016-09-21 DIAGNOSIS — I480 Paroxysmal atrial fibrillation: Secondary | ICD-10-CM | POA: Diagnosis not present

## 2016-09-21 DIAGNOSIS — G4733 Obstructive sleep apnea (adult) (pediatric): Secondary | ICD-10-CM | POA: Diagnosis not present

## 2016-09-21 DIAGNOSIS — I10 Essential (primary) hypertension: Secondary | ICD-10-CM | POA: Diagnosis not present

## 2016-09-21 DIAGNOSIS — Z1389 Encounter for screening for other disorder: Secondary | ICD-10-CM | POA: Diagnosis not present

## 2016-09-21 DIAGNOSIS — Z6834 Body mass index (BMI) 34.0-34.9, adult: Secondary | ICD-10-CM | POA: Diagnosis not present

## 2016-09-21 DIAGNOSIS — J45998 Other asthma: Secondary | ICD-10-CM | POA: Diagnosis not present

## 2016-09-21 DIAGNOSIS — E784 Other hyperlipidemia: Secondary | ICD-10-CM | POA: Diagnosis not present

## 2016-09-21 DIAGNOSIS — I5022 Chronic systolic (congestive) heart failure: Secondary | ICD-10-CM | POA: Diagnosis not present

## 2016-09-21 DIAGNOSIS — E1129 Type 2 diabetes mellitus with other diabetic kidney complication: Secondary | ICD-10-CM | POA: Diagnosis not present

## 2016-09-21 DIAGNOSIS — Z23 Encounter for immunization: Secondary | ICD-10-CM | POA: Diagnosis not present

## 2016-09-21 DIAGNOSIS — Z7901 Long term (current) use of anticoagulants: Secondary | ICD-10-CM | POA: Diagnosis not present

## 2016-09-22 DIAGNOSIS — M1712 Unilateral primary osteoarthritis, left knee: Secondary | ICD-10-CM | POA: Diagnosis not present

## 2016-10-17 DIAGNOSIS — M1712 Unilateral primary osteoarthritis, left knee: Secondary | ICD-10-CM | POA: Diagnosis not present

## 2016-10-27 NOTE — Patient Instructions (Addendum)
Colton Brooks  10/27/2016   Your procedure is scheduled on: 11/06/16  Report to [facility] Main  Entrance take [facility]  elevators to 3rd floor to  [place] at 11:50 AM.  Call this number if you have problems the morning of surgery [phone]   Remember: ONLY 1 PERSON MAY GO WITH YOU TO SHORT STAY TO GET  READY MORNING OF [place].  Do not eat food or drink liquids :After Midnight.  You may have clear liquids until 8:50 AM morning of surgery. Then nothing to eat or drink.     Take these medicines the morning of surgery with A SIP OF WATER: AMLODIPINE (NORVASC), BREO INHALER, LEVOTHRYOXINE (SYNTHROID), PANTAPRAZOLE, METOPROLOL SUCCINATE DO NOT TAKE ANY DIABETIC MEDICATIONS DAY OF YOUR SURGERY, SEE DIABETIC INSTRUCTIONS BELOW                               You may not have any metal on your body including hair pins and              piercings  Do not wear jewelry, make-up, lotions, powders or perfumes, deodorant             Do not wear nail polish.  Do not shave  48 hours prior to surgery.              Men may shave face and neck.   Do not bring valuables to the hospital. [place].  Contacts, dentures or bridgework may not be worn into surgery.  Leave suitcase in the car. After surgery it may be brought to your room.                 Please read over the following fact sheets you were given: _____________________________________________________________________ How to Manage Your Diabetes Before and After Surgery  Why is it important to control my blood sugar before and after surgery? . Improving blood sugar levels before and after surgery helps healing and can limit problems. . A way of improving blood sugar control is eating a healthy diet by: o  Eating less sugar and carbohydrates o  Increasing activity/exercise o  Talking with your doctor about reaching your blood sugar goals . High blood  sugars (greater than 180 mg/dL) can raise your risk of infections and slow your recovery, so you will need to focus on controlling your diabetes during the weeks before surgery. . Make sure that the doctor who takes care of your diabetes knows about your planned surgery including the date and location.  How do I manage my blood sugar before surgery? . Check your blood sugar at least 4 times a day, starting 2 days before surgery, to make sure that the level is not too high or low. o Check your blood sugar the morning of your surgery when you wake up and every 2 hours until you get to the Short Stay unit. . If your blood sugar is less than 70 mg/dL, you will need to treat for low blood sugar: o Do not take insulin. o Treat a low blood sugar (less than 70 mg/dL) with  cup of clear juice (cranberry or apple), 4 glucose tablets, OR glucose gel. o Recheck blood sugar in 15 minutes  after treatment (to make sure it is greater than 70 mg/dL). If your blood sugar is not greater than 70 mg/dL on recheck, call [phone] for further instructions. . Report your blood sugar to the short stay nurse when you get to Short Stay.  . If you are admitted to the hospital after surgery: o Your blood sugar will be checked by the staff and you will probably be given insulin after surgery (instead of oral diabetes medicines) to make sure you have good blood sugar levels. o The goal for blood sugar control after surgery is 80-180 mg/dL.   WHAT DO I DO ABOUT MY DIABETES MEDICATION?  you may take [place] and metformin as usual the day before surgery on 11-05-16 . Do not take oral diabetes medicines (pills) the morning of surgery.  . THE NIGHT BEFORE SURGERY, take 10 units of   TRESHIBA iinsulin.     TAKE USUAL VICTOZA DOSE NIGHT BEFORE   . The day of surgery, do not take other diabetes injectables, including Byetta (exenatide), Bydureon (exenatide ER), Victoza (liraglutide), or Trulicity (dulaglutide).   Patient  Signature:  Date:   Nurse Signature:  Date:   Reviewed and Endorsed by [facility] Patient Education Committee, August 2015            [facility] - Preparing for Surgery Before surgery, you can play an important role.  Because skin is not sterile, your skin needs to be as free of germs as possible.  You can reduce the number of germs on your skin by washing with CHG (chlorahexidine gluconate) soap before surgery.  CHG is an antiseptic cleaner which kills germs and bonds with the skin to continue killing germs even after washing. Please DO NOT use if you have an allergy to CHG or antibacterial soaps.  If your skin becomes reddened/irritated stop using the CHG and inform your nurse when you arrive at Short Stay. Do not shave (including legs and underarms) for at least 48 hours prior to the first CHG shower.  You may shave your face/neck. Please follow these instructions carefully:  1.  Shower with CHG Soap the night before surgery and the  morning of Surgery.  2.  If you choose to wash your hair, wash your hair first as usual with your  normal  shampoo.  3.  After you shampoo, rinse your hair and body thoroughly to remove the  shampoo.                           4.  Use CHG as you would any other liquid soap.  You can apply chg directly  to the skin and wash                       Gently with a scrungie or clean washcloth.  5.  Apply the CHG Soap to your body ONLY FROM THE NECK DOWN.   Do not use on face/ open                           Wound or open sores. Avoid contact with eyes, ears mouth and genitals (private parts).                       Wash face,  Genitals (private parts) with your normal soap.             6.  Wash thoroughly, paying special attention to the area where your surgery  will be performed.  7.  Thoroughly rinse your body with warm water from the neck down.  8.  DO NOT shower/wash with your normal soap after using and rinsing off  the CHG Soap.                9.  Pat yourself  dry with a clean towel.            10.  Wear clean pajamas.            11.  Place clean sheets on your bed the night of your first shower and do not  sleep with pets. Day of Surgery : Do not apply any lotions/deodorants the morning of surgery.  Please wear clean clothes to the hospital/surgery center.  ________________________________________________________________________   Incentive Spirometer  An incentive spirometer is a tool that can help keep your lungs clear and active. This tool measures how well you are filling your lungs with each breath. Taking long deep breaths may help reverse or decrease the chance of developing breathing (pulmonary) problems (especially infection) following:  A long period of time when you are unable to move or be active. BEFORE THE PROCEDURE   If the spirometer includes an indicator to show your best effort, your nurse or respiratory therapist will set it to a desired goal.  If possible, sit up straight or lean slightly forward. Try not to slouch.  Hold the incentive spirometer in an upright position. INSTRUCTIONS FOR USE  1. Sit on the edge of your bed if possible, or sit up as far as you can in bed or on a chair. 2. Hold the incentive spirometer in an upright position. 3. Breathe out normally. 4. Place the mouthpiece in your mouth and seal your lips tightly around it. 5. Breathe in slowly and as deeply as possible, raising the piston or the ball toward the top of the column. 6. Hold your breath for 3-5 seconds or for as long as possible. Allow the piston or ball to fall to the bottom of the column. 7. Remove the mouthpiece from your mouth and breathe out normally. 8. Rest for a few seconds and repeat Steps 1 through 7 at least 10 times every 1-2 hours when you are awake. Take your time and take a few normal breaths between deep breaths. 9. The spirometer may include an indicator to show your best effort. Use the indicator as a goal to work toward  during each repetition. 10. After each set of 10 deep breaths, practice coughing to be sure your lungs are clear. If you have an incision (the cut made at the time of surgery), support your incision when coughing by placing a pillow or rolled up towels firmly against it. Once you are able to get out of bed, walk around indoors and cough well. You may stop using the incentive spirometer when instructed by your caregiver.  RISKS AND COMPLICATIONS  Take your time so you do not get dizzy or light-headed.  If you are in pain, you may need to take or ask for pain medication before doing incentive spirometry. It is harder to take a deep breath if you are having pain. AFTER USE  Rest and breathe slowly and easily.  It can be helpful to keep track of a log of your progress. Your caregiver can provide you with a simple table to help with this. If you are using the spirometer  at home, follow these instructions: [place] IF:   You are having difficultly using the spirometer.  You have trouble using the spirometer as often as instructed.  Your pain medication is not giving enough relief while using the spirometer.  You develop fever of 100.5 F (38.1 C) or higher. SEEK IMMEDIATE MEDICAL CARE IF:   You cough up bloody sputum that had not been present before.  You develop fever of 102 F (38.9 C) or greater.  You develop worsening pain at or near the incision site. MAKE SURE YOU:   Understand these instructions.  Will watch your condition.  Will get help right away if you are not doing well or get worse. Document Released: 03/19/2007 Document Revised: 01/29/2012 Document Reviewed: 05/20/2007 ExitCare Patient Information 2014 Exit[employer], [place].   ________________________________________________________________________  WHAT IS A BLOOD TRANSFUSION? Blood Transfusion Information  A transfusion is the replacement of blood or some of its parts. Blood is made up of multiple cells  which provide different functions.  Red blood cells carry oxygen and are used for blood loss replacement.  White blood cells fight against infection.  Platelets control bleeding.  Plasma helps clot blood.  Other blood products are available for specialized needs, such as hemophilia or other clotting disorders. BEFORE THE TRANSFUSION  Who gives blood for transfusions?   Healthy volunteers who are fully evaluated to make sure their blood is safe. This is blood bank blood. Transfusion therapy is the safest it has ever been in the practice of medicine. Before blood is taken from a donor, a complete history is taken to make sure that person has no history of diseases nor engages in risky social behavior (examples are intravenous drug use or sexual activity with multiple partners). The donor's travel history is screened to minimize risk of transmitting infections, such as malaria. The donated blood is tested for signs of infectious diseases, such as HIV and hepatitis. The blood is then tested to be sure it is compatible with you in order to minimize the chance of a transfusion reaction. If you or a relative donates blood, this is often done in anticipation of surgery and is not appropriate for emergency situations. It takes many days to process the donated blood. RISKS AND COMPLICATIONS Although transfusion therapy is very safe and saves many lives, the main dangers of transfusion include:   Getting an infectious disease.  Developing a transfusion reaction. This is an allergic reaction to something in the blood you were given. Every precaution is taken to prevent this. The decision to have a blood transfusion has been considered carefully by your caregiver before blood is given. Blood is not given unless the benefits outweigh the risks. AFTER THE TRANSFUSION  Right after receiving a blood transfusion, you will usually feel much better and more energetic. This is especially true if your red blood  cells have gotten low (anemic). The transfusion raises the level of the red blood cells which carry oxygen, and this usually causes an energy increase.  The nurse administering the transfusion will monitor you carefully for complications. HOME CARE INSTRUCTIONS  No special instructions are needed after a transfusion. You may find your energy is better. Speak with your caregiver about any limitations on activity for underlying diseases you may have. SEEK MEDICAL CARE IF:   Your condition is not improving after your transfusion.  You develop redness or irritation at the intravenous (IV) site. SEEK IMMEDIATE MEDICAL CARE IF:  Any of the following symptoms occur over the  next 12 hours:  Shaking chills.  You have a temperature by mouth above 102 F (38.9 C), not controlled by medicine.  Chest, back, or muscle pain.  People around you feel you are not acting correctly or are confused.  Shortness of breath or difficulty breathing.  Dizziness and fainting.  You get a rash or develop hives.  You have a decrease in urine output.  Your urine turns a dark color or changes to pink, red, or brown. Any of the following symptoms occur over the next 10 days:  You have a temperature by mouth above 102 F (38.9 C), not controlled by medicine.  Shortness of breath.  Weakness after normal activity.  The white part of the eye turns yellow (jaundice).  You have a decrease in the amount of urine or are urinating less often.  Your urine turns a dark color or changes to pink, red, or brown. Document Released: 11/03/2000 Document Revised: 01/29/2012 Document Reviewed: 06/22/2008 [facility] Patient Information 2014 [place], [place].  _______________________________________________________________________

## 2016-11-01 ENCOUNTER — Encounter (HOSPITAL_COMMUNITY): Payer: Self-pay

## 2016-11-01 ENCOUNTER — Encounter (INDEPENDENT_AMBULATORY_CARE_PROVIDER_SITE_OTHER): Payer: Self-pay

## 2016-11-01 ENCOUNTER — Encounter (HOSPITAL_COMMUNITY)
Admission: RE | Admit: 2016-11-01 | Discharge: 2016-11-01 | Disposition: A | Discharge status: Home / Self Care | Payer: Medicare Other | Source: Ambulatory Visit | Attending: Orthopedic Surgery | Admitting: Orthopedic Surgery

## 2016-11-01 DIAGNOSIS — M1712 Unilateral primary osteoarthritis, left knee: Secondary | ICD-10-CM | POA: Diagnosis not present

## 2016-11-01 DIAGNOSIS — Z01812 Encounter for preprocedural laboratory examination: Secondary | ICD-10-CM | POA: Insufficient documentation

## 2016-11-01 HISTORY — DX: Chronic kidney disease, unspecified: N18.9

## 2016-11-01 HISTORY — DX: Hypothyroidism, unspecified: E03.9

## 2016-11-01 HISTORY — DX: Dizziness and giddiness: R42

## 2016-11-01 HISTORY — DX: Unspecified osteoarthritis, unspecified site: M19.90

## 2016-11-01 HISTORY — DX: Essential (primary) hypertension: I10

## 2016-11-01 HISTORY — DX: Nausea with vomiting, unspecified: R11.2

## 2016-11-01 HISTORY — DX: Disorder of the skin and subcutaneous tissue, unspecified: L98.9

## 2016-11-01 HISTORY — DX: Other specified postprocedural states: Z98.890

## 2016-11-01 HISTORY — DX: Unspecified malignant neoplasm of skin, unspecified: C44.90

## 2016-11-01 LAB — CBC
HCT: 49.1 % (ref 39.0–52.0)
Hemoglobin: 15.8 g/dL (ref 13.0–17.0)
MCH: 25.6 pg — ABNORMAL LOW (ref 26.0–34.0)
MCHC: 32.2 g/dL (ref 30.0–36.0)
MCV: 79.7 fL (ref 78.0–100.0)
Platelets: 186 K/uL (ref 150–400)
RBC: 6.16 MIL/uL — ABNORMAL HIGH (ref 4.22–5.81)
RDW: 17.9 % — ABNORMAL HIGH (ref 11.5–15.5)
WBC: 8.1 K/uL (ref 4.0–10.5)

## 2016-11-01 LAB — APTT: aPTT: 39 s — ABNORMAL HIGH (ref 24–36)

## 2016-11-01 LAB — URINALYSIS, ROUTINE W REFLEX MICROSCOPIC
BACTERIA UA: NONE SEEN
Bilirubin Urine: NEGATIVE
Glucose, UA: >=500 mg/dL — AB
KETONES UR: NEGATIVE mg/dL
Nitrite: NEGATIVE
PH: 5 (ref 5.0–8.0)
Protein, ur: NEGATIVE mg/dL
Specific Gravity, Urine: 1.013 (ref 1.005–1.030)

## 2016-11-01 LAB — COMPREHENSIVE METABOLIC PANEL WITH GFR
ALT: 16 U/L — ABNORMAL LOW (ref 17–63)
AST: 21 U/L (ref 15–41)
Albumin: 4.2 g/dL (ref 3.5–5.0)
Alkaline Phosphatase: 67 U/L (ref 38–126)
Anion gap: 8 (ref 5–15)
BUN: 20 mg/dL (ref 6–20)
CO2: 26 mmol/L (ref 22–32)
Calcium: 9.5 mg/dL (ref 8.9–10.3)
Chloride: 104 mmol/L (ref 101–111)
Creatinine, Ser: 1.3 mg/dL — ABNORMAL HIGH (ref 0.61–1.24)
GFR calc Af Amer: 59 mL/min — ABNORMAL LOW
GFR calc non Af Amer: 51 mL/min — ABNORMAL LOW
Glucose, Bld: 107 mg/dL — ABNORMAL HIGH (ref 65–99)
Potassium: 3.4 mmol/L — ABNORMAL LOW (ref 3.5–5.1)
Sodium: 138 mmol/L (ref 135–145)
Total Bilirubin: 0.8 mg/dL (ref 0.3–1.2)
Total Protein: 7.3 g/dL (ref 6.5–8.1)

## 2016-11-01 LAB — PROTIME-INR
INR: 1.33
Prothrombin Time: 16.5 s — ABNORMAL HIGH (ref 11.4–15.2)

## 2016-11-01 LAB — SURGICAL PCR SCREEN
MRSA, PCR: NEGATIVE
STAPHYLOCOCCUS AUREUS: NEGATIVE

## 2016-11-01 LAB — GLUCOSE, CAPILLARY: Glucose-Capillary: 121 mg/dL — ABNORMAL HIGH (ref 65–99)

## 2016-11-01 NOTE — Progress Notes (Signed)
CMET AND UA RESULTS FAXED TO DR Lyndle Herrlich BY EPIC

## 2016-11-01 NOTE — Progress Notes (Signed)
ECHO 05-27-16 EPIC  EKG 09-14-16 EPIC  CARDIAC CLEARANCE NOTE Ellen Henri PA 09-14-16 EPIC

## 2016-11-02 LAB — HEMOGLOBIN A1C
Hgb A1c MFr Bld: 6.6 % — ABNORMAL HIGH (ref 4.8–5.6)
MEAN PLASMA GLUCOSE: 143 mg/dL

## 2016-11-03 ENCOUNTER — Ambulatory Visit: Payer: Self-pay | Admitting: Orthopedic Surgery

## 2016-11-03 NOTE — H&P (Signed)
Marline Backbone DOB: [birth year] Married / Language: English / Race: White Male Date of Admission:  11/06/2017 CC:  Left knee pain History of Present Illness The patient is a 75 year old male who comes in  for a preoperative History and Physical. The patient is scheduled for a left total knee arthroplasty to be performed by Dr. Dione Plover. Aluisio, MD at [facility] on 11-06-2016. The patient is a 75 year old male who presented with knee complaints. The patient reports left knee symptoms including: pain, swelling, instability, locking, catching, giving way, weakness, stiffness, soreness and grinding which began 7 year(s) ago without any known injury. The patient describes their pain as sharp, dull, aching, burning, stinging and throbbing.The patient feels that the symptoms are worsening. The patient has the current diagnosis of knee osteoarthritis. Past treatment for this problem has included intra-articular injection of corticosteroids (cortisone and visco: did not help) and nonsteroidal anti-inflammatory drugs. Symptoms are reported to be located in the left knee and include knee pain. The patient does not report any radiation of symptoms. Current treatment includes Oxycodone 5MG  given by Dr. Brigitte Pulse. Unfortunately, his left knee is at a stage where the right one was before he had that replaced. Knee is hurting at all times. It has given out on him. He does get swelling. Has not locked up. It is limiting what he can and cannot do. It even hurts him at night. He is ready now to get the left knee replaced. They have been treated conservatively in the past for the above stated problem and despite conservative measures, they continue to have progressive pain and severe functional limitations and dysfunction. They have failed non-operative management including home exercise, medications, and injections. It is felt that they would benefit from undergoing total joint replacement. Risks and benefits of the  procedure have been discussed with the patient and they elect to proceed with surgery. There are no active contraindications to surgery such as ongoing infection or rapidly progressive neurological disease.   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 Cerebrovascular Accident  Father, Mother. Chronic Obstructive Lung Disease  Brother, Father. Diabetes Mellitus  Father, Mother. Drug / Alcohol Addiction  Brother. Heart Disease  Mother. Hypertension  Father, Mother, Sister. Kidney disease  Sister. Osteoarthritis  Mother.  Social History Children  2 Current drinker  08/24/2016: Currently drinks beer and hard liquor only occasionally per week Current work status  retired Furniture conservator/restorer daily; does other and gym / [employer] Living situation  live with spouse Marital status  married No history of drug/alcohol rehab  Not under pain contract  Number of flights of stairs before winded  1 Tobacco / smoke exposure  08/24/2016: no Tobacco use  Former smoker. 08/24/2016: smoke(d) less than 1/2 pack(s) per day Advance Directives  Living Will, Healthcare POA  Medication  History  BD Pen Needle Short U/F (31G X 8 MM Misc,) Active. Losartan Potassium (100MG  Tablet, Oral) Active. Proctosol HC (2.5% Cream, Rectal) Active. Victoza (18MG /3ML Soln Pen-inj, Subcutaneous) Active. Zofran (4MG  Tablet, Oral) Active. Lumigan (0.01% Solution, Ophthalmic) Active. Eliquis (5MG  Tablet, Oral) Active. Turmeric Active. Simethicone Active. Uloric (80MG  Tablet, Oral) Active. Voltaren Gel Active. AndroGel Pump (20.25 MG/ACT(1.62%) Gel, Transdermal) Active. Desonide (0.05% Cream, External) Active. Furosemide (20MG  Tablet, Oral) Active. HydroCHLOROthiazide (25MG  Tablet, Oral) Active. Potassium Chloride Crys ER (20MEQ Tablet ER, Oral) Active. Atorvastatin Calcium (80MG  Tablet, Oral) Active. MetFORMIN HCl (1000MG  Tablet, Oral) Active. Pantoprazole Sodium (40MG  Tablet DR, Oral) Active. Qualaquin Active. Metaxalone (800MG  Tablet, Oral) Active. Montelukast Sodium (10MG  Tablet, Oral) Active. Collagen 500 mg Active. Fluticasone Propionate (50MCG/ACT Suspension, Nasal) Active. Azelastine Nasal Spray Active. Synthroid (50MCG Tablet, Oral) Active. CoQ10 Active. OMEGA-3 Krill Oil Active. Claritin 10 mg Active. ProAir HFA Active. ColCrys 0.6 Active. Toprol XL (100MG  Tablet ER 24HR, Oral) Active. Creon (24000UNIT Capsule DR Part, Oral) Active. Safflower 420 mg Active. OxyCODONE HCl (5MG  Tablet, Oral) Active.  Past Surgical History Arthroscopy of Knee  right Cataract Surgery  bilateral Inguinal Hernia Repair  laparoscopic: bilateral Tonsillectomy  Total Knee Replacement  right   Review of Systems  General Present- Fatigue and Night Sweats. Not Present- Chills, Fever, Memory Loss, Weight Gain and Weight Loss. Skin Not Present- Eczema, Hives, Itching, Lesions and Rash. HEENT Present- Hearing Loss and Tinnitus. Not Present- Dentures, Double Vision, Headache and Visual Loss. Respiratory Present- Cough and Shortness of breath with  exertion. Not Present- Allergies, Chronic Cough, Coughing up blood and Shortness of breath at rest. Cardiovascular Present- Difficulty Breathing Lying Down (due to sleep apena). Not Present- Chest Pain, Murmur, Palpitations, Racing/skipping heartbeats and Swelling. Gastrointestinal Present- Diarrhea and Nausea. Not Present- Abdominal Pain, Bloody Stool, Constipation, Difficulty Swallowing, Heartburn, Jaundice, Loss of appetitie and Vomiting. Male Genitourinary Present- Urinary frequency and Weak urinary stream. Not Present- Blood in Urine, Discharge, Flank Pain, Incontinence, Painful Urination, Urgency, Urinary Retention and Urinating at Night. Musculoskeletal Present- Back Pain, Joint Pain, Joint Swelling, Morning Stiffness, Muscle Pain, Muscle Weakness and Spasms. Neurological Not Present- Blackout spells, Difficulty with balance, Dizziness, Paralysis, Tremor and Weakness. Psychiatric Not Present- Insomnia.  Vitals Weight: 231 lb Height: 69in Weight was reported by patient. Height was reported by patient. Body Surface Area: 2.2 m Body Mass Index: 34.11 kg/m  Pulse: 56 (Regular)  BP: 128/68 (Sitting, Right Arm, Standard)   Physical Exam  General Mental Status -Alert, cooperative and good historian. General Appearance-pleasant, Not in acute distress. Orientation-Oriented X3. Build & Nutrition-Well nourished and Well developed.  Head and Neck Head-normocephalic, atraumatic . Neck Global Assessment - supple, no bruit auscultated on the right, no bruit auscultated on the left.  Eye Vision-Wears corrective lenses. Pupil - Bilateral-Regular and Round. Motion - Bilateral-EOMI.  Chest and Lung Exam Auscultation Breath sounds - clear at anterior chest wall and clear at posterior chest wall. Adventitious sounds - No Adventitious sounds.  Cardiovascular Auscultation Rhythm - Irregularly irregular(known atrial fib). Heart Sounds - S1 WNL and S2 WNL. Murmurs &  Other Heart Sounds - Auscultation of the heart reveals - No Murmurs.  Abdomen Palpation/Percussion Tenderness - Abdomen is non-tender to palpation. Rigidity (guarding) - Abdomen is soft. Auscultation Auscultation of the abdomen reveals - Bowel sounds normal.  Male Genitourinary Note: Not done, not pertinent to present illness   Musculoskeletal Note: He is in no distress. His hips show normal range of motion  with no discomfort. His right knee shows no effusion. Range of motion right knee is 0 to 115. He is nontender about that knee. There is no instability. Left knee, no effusion. Varus deformity, range 5 to 125, moderate crepitus on range of motion. Tender medial greater than lateral, no instability noted.  RADIOGRAPHS AP both knees and lateral show the prosthesis on the right in excellent position, no periprosthetic abnormalities. On the left, he has got bone on bone arthritis in the medial and patellofemoral compartments of the left knee.   Assessment & Plan Primary osteoarthritis of left knee (M17.12)  Note:Surgical Plans: Left Total Knee Replacement  Disposition:   PCP: Dr. Lang Snow Cards: Dr. Houston Siren  Topical TXA  Anesthesia Issues: Nausea and Vertigo  Signed electronically by Ok Edwards, III PA-C

## 2016-11-06 ENCOUNTER — Inpatient Hospital Stay (HOSPITAL_COMMUNITY)
Admission: RE | Admit: 2016-11-06 | Discharge: 2016-11-08 | DRG: 470 | Disposition: A | Discharge status: Home / Self Care | Payer: Medicare Other | Source: Ambulatory Visit | Attending: Orthopedic Surgery | Admitting: Orthopedic Surgery

## 2016-11-06 ENCOUNTER — Encounter (HOSPITAL_COMMUNITY)
Admission: RE | Disposition: A | Discharge status: Home / Self Care | Payer: Self-pay | Source: Ambulatory Visit | Attending: Orthopedic Surgery

## 2016-11-06 ENCOUNTER — Inpatient Hospital Stay (HOSPITAL_COMMUNITY): Payer: Medicare Other | Admitting: Anesthesiology

## 2016-11-06 ENCOUNTER — Encounter (HOSPITAL_COMMUNITY): Payer: Self-pay | Admitting: *Deleted

## 2016-11-06 DIAGNOSIS — I482 Chronic atrial fibrillation: Secondary | ICD-10-CM | POA: Diagnosis present

## 2016-11-06 DIAGNOSIS — I10 Essential (primary) hypertension: Secondary | ICD-10-CM | POA: Diagnosis not present

## 2016-11-06 DIAGNOSIS — J45909 Unspecified asthma, uncomplicated: Secondary | ICD-10-CM | POA: Diagnosis not present

## 2016-11-06 DIAGNOSIS — Z87891 Personal history of nicotine dependence: Secondary | ICD-10-CM

## 2016-11-06 DIAGNOSIS — I5022 Chronic systolic (congestive) heart failure: Secondary | ICD-10-CM | POA: Diagnosis not present

## 2016-11-06 DIAGNOSIS — Z79899 Other long term (current) drug therapy: Secondary | ICD-10-CM | POA: Diagnosis not present

## 2016-11-06 DIAGNOSIS — E119 Type 2 diabetes mellitus without complications: Secondary | ICD-10-CM | POA: Diagnosis not present

## 2016-11-06 DIAGNOSIS — Z96652 Presence of left artificial knee joint: Secondary | ICD-10-CM

## 2016-11-06 DIAGNOSIS — M25562 Pain in left knee: Secondary | ICD-10-CM | POA: Diagnosis not present

## 2016-11-06 DIAGNOSIS — N189 Chronic kidney disease, unspecified: Secondary | ICD-10-CM | POA: Diagnosis present

## 2016-11-06 DIAGNOSIS — Z794 Long term (current) use of insulin: Secondary | ICD-10-CM

## 2016-11-06 DIAGNOSIS — M1712 Unilateral primary osteoarthritis, left knee: Secondary | ICD-10-CM | POA: Diagnosis not present

## 2016-11-06 DIAGNOSIS — I129 Hypertensive chronic kidney disease with stage 1 through stage 4 chronic kidney disease, or unspecified chronic kidney disease: Secondary | ICD-10-CM | POA: Diagnosis present

## 2016-11-06 DIAGNOSIS — G4733 Obstructive sleep apnea (adult) (pediatric): Secondary | ICD-10-CM | POA: Diagnosis not present

## 2016-11-06 DIAGNOSIS — M179 Osteoarthritis of knee, unspecified: Secondary | ICD-10-CM | POA: Diagnosis present

## 2016-11-06 DIAGNOSIS — Z7901 Long term (current) use of anticoagulants: Secondary | ICD-10-CM | POA: Diagnosis not present

## 2016-11-06 DIAGNOSIS — K219 Gastro-esophageal reflux disease without esophagitis: Secondary | ICD-10-CM | POA: Diagnosis present

## 2016-11-06 DIAGNOSIS — E039 Hypothyroidism, unspecified: Secondary | ICD-10-CM | POA: Diagnosis present

## 2016-11-06 DIAGNOSIS — G8918 Other acute postprocedural pain: Secondary | ICD-10-CM | POA: Diagnosis not present

## 2016-11-06 DIAGNOSIS — Z96651 Presence of right artificial knee joint: Secondary | ICD-10-CM | POA: Diagnosis present

## 2016-11-06 DIAGNOSIS — M1710 Unilateral primary osteoarthritis, unspecified knee: Secondary | ICD-10-CM

## 2016-11-06 HISTORY — PX: TOTAL KNEE ARTHROPLASTY: SHX125

## 2016-11-06 LAB — TYPE AND SCREEN
ABO/RH(D): B POS
Antibody Screen: NEGATIVE

## 2016-11-06 LAB — GLUCOSE, CAPILLARY
GLUCOSE-CAPILLARY: 100 mg/dL — AB (ref 65–99)
GLUCOSE-CAPILLARY: 104 mg/dL — AB (ref 65–99)
GLUCOSE-CAPILLARY: 157 mg/dL — AB (ref 65–99)

## 2016-11-06 SURGERY — ARTHROPLASTY, KNEE, TOTAL
Anesthesia: Spinal | Site: Knee | Laterality: Left

## 2016-11-06 MED ORDER — PHENYLEPHRINE 40 MCG/ML (10ML) SYRINGE FOR IV PUSH (FOR BLOOD PRESSURE SUPPORT)
PREFILLED_SYRINGE | INTRAVENOUS | Status: AC
  Filled 2016-11-06: qty 10

## 2016-11-06 MED ORDER — SODIUM CHLORIDE 0.9 % IR SOLN
Status: DC | PRN
  Administered 2016-11-06: 1000 mL

## 2016-11-06 MED ORDER — FLEET ENEMA 7-19 GM/118ML RE ENEM: 1.0000 | ENEMA | Freq: Once | RECTAL | Status: DC | PRN

## 2016-11-06 MED ORDER — EPHEDRINE SULFATE 50 MG/ML IJ SOLN
INTRAMUSCULAR | Status: DC | PRN
  Administered 2016-11-06 (×4): 5 mg via INTRAVENOUS

## 2016-11-06 MED ORDER — ROPIVACAINE HCL 7.5 MG/ML IJ SOLN
INTRAMUSCULAR | Status: AC
  Filled 2016-11-06: qty 20

## 2016-11-06 MED ORDER — DOCUSATE SODIUM 100 MG PO CAPS
100.0000 mg | ORAL_CAPSULE | Freq: Two times a day (BID) | ORAL | Status: DC
  Administered 2016-11-06 – 2016-11-08 (×4): 100 mg via ORAL
  Filled 2016-11-06 (×4): qty 1

## 2016-11-06 MED ORDER — PANTOPRAZOLE SODIUM 40 MG PO TBEC
40.0000 mg | DELAYED_RELEASE_TABLET | Freq: Two times a day (BID) | ORAL | Status: DC
  Administered 2016-11-06 – 2016-11-08 (×4): 40 mg via ORAL
  Filled 2016-11-06 (×4): qty 1

## 2016-11-06 MED ORDER — SODIUM CHLORIDE 0.9 % IJ SOLN
INTRAMUSCULAR | Status: AC
  Filled 2016-11-06: qty 50

## 2016-11-06 MED ORDER — PROPOFOL 10 MG/ML IV BOLUS
INTRAVENOUS | Status: AC
  Filled 2016-11-06: qty 40

## 2016-11-06 MED ORDER — BUPIVACAINE LIPOSOME 1.3 % IJ SUSP
20.0000 mL | Freq: Once | INTRAMUSCULAR | Status: DC
  Filled 2016-11-06: qty 20

## 2016-11-06 MED ORDER — METHOCARBAMOL 1000 MG/10ML IJ SOLN
500.0000 mg | Freq: Four times a day (QID) | INTRAVENOUS | Status: DC | PRN
  Administered 2016-11-06: 500 mg via INTRAVENOUS
  Filled 2016-11-06: qty 550
  Filled 2016-11-06: qty 5

## 2016-11-06 MED ORDER — LIDOCAINE 2% (20 MG/ML) 5 ML SYRINGE
INTRAMUSCULAR | Status: AC
  Filled 2016-11-06: qty 5

## 2016-11-06 MED ORDER — METHOCARBAMOL 500 MG PO TABS
500.0000 mg | ORAL_TABLET | Freq: Four times a day (QID) | ORAL | Status: DC | PRN
  Administered 2016-11-07 – 2016-11-08 (×6): 500 mg via ORAL
  Filled 2016-11-06 (×6): qty 1

## 2016-11-06 MED ORDER — LATANOPROST 0.005 % OP SOLN
1.0000 [drp] | Freq: Every day | OPHTHALMIC | Status: DC
  Administered 2016-11-06 – 2016-11-07 (×2): 1 [drp] via OPHTHALMIC
  Filled 2016-11-06: qty 2.5

## 2016-11-06 MED ORDER — PANCRELIPASE (LIP-PROT-AMYL) 12000-38000 UNITS PO CPEP: 24000.0000 [IU] | ORAL_CAPSULE | Freq: Three times a day (TID) | ORAL | Status: DC | PRN

## 2016-11-06 MED ORDER — PROMETHAZINE HCL 25 MG/ML IJ SOLN: 6.2500 mg | INTRAMUSCULAR | Status: DC | PRN

## 2016-11-06 MED ORDER — MORPHINE SULFATE (PF) 2 MG/ML IV SOLN
1.0000 mg | INTRAVENOUS | Status: DC | PRN
  Administered 2016-11-06 – 2016-11-07 (×4): 1 mg via INTRAVENOUS
  Filled 2016-11-06 (×4): qty 1

## 2016-11-06 MED ORDER — FENTANYL CITRATE (PF) 100 MCG/2ML IJ SOLN
INTRAMUSCULAR | Status: AC
  Filled 2016-11-06: qty 2

## 2016-11-06 MED ORDER — BUPIVACAINE IN DEXTROSE 0.75-8.25 % IT SOLN
INTRATHECAL | Status: DC | PRN
  Administered 2016-11-06: 2 mL via INTRATHECAL

## 2016-11-06 MED ORDER — ROPIVACAINE HCL 7.5 MG/ML IJ SOLN
INTRAMUSCULAR | Status: DC | PRN
  Administered 2016-11-06: 20 mL via PERINEURAL

## 2016-11-06 MED ORDER — PHENYLEPHRINE HCL 10 MG/ML IJ SOLN
INTRAVENOUS | Status: DC | PRN
  Administered 2016-11-06: 30 ug/min via INTRAVENOUS

## 2016-11-06 MED ORDER — METOPROLOL SUCCINATE ER 50 MG PO TB24
100.0000 mg | ORAL_TABLET | Freq: Every day | ORAL | Status: DC
  Administered 2016-11-06 – 2016-11-07 (×2): 100 mg via ORAL
  Filled 2016-11-06 (×2): qty 2

## 2016-11-06 MED ORDER — CEFAZOLIN SODIUM-DEXTROSE 2-4 GM/100ML-% IV SOLN
2.0000 g | INTRAVENOUS | Status: AC
  Administered 2016-11-06: 2 g via INTRAVENOUS
  Filled 2016-11-06: qty 100

## 2016-11-06 MED ORDER — HYDROCORTISONE 2.5 % RE CREA: 1.0000 "application " | TOPICAL_CREAM | Freq: Two times a day (BID) | RECTAL | Status: DC | PRN

## 2016-11-06 MED ORDER — ACETAMINOPHEN 10 MG/ML IV SOLN
INTRAVENOUS | Status: AC
  Filled 2016-11-06: qty 100

## 2016-11-06 MED ORDER — MONTELUKAST SODIUM 10 MG PO TABS
20.0000 mg | ORAL_TABLET | Freq: Every day | ORAL | Status: DC
  Administered 2016-11-06 – 2016-11-07 (×2): 20 mg via ORAL
  Filled 2016-11-06 (×2): qty 2

## 2016-11-06 MED ORDER — ACETAMINOPHEN 10 MG/ML IV SOLN
1000.0000 mg | Freq: Once | INTRAVENOUS | Status: AC
  Administered 2016-11-06: 1000 mg via INTRAVENOUS

## 2016-11-06 MED ORDER — MIDAZOLAM HCL 2 MG/2ML IJ SOLN
INTRAMUSCULAR | Status: AC
  Filled 2016-11-06: qty 2

## 2016-11-06 MED ORDER — MIDAZOLAM HCL 5 MG/ML IJ SOLN
1.0000 mg | INTRAMUSCULAR | Status: DC | PRN
  Administered 2016-11-06: 1 mg via INTRAVENOUS

## 2016-11-06 MED ORDER — CEFAZOLIN SODIUM-DEXTROSE 2-4 GM/100ML-% IV SOLN
INTRAVENOUS | Status: AC
  Filled 2016-11-06: qty 100

## 2016-11-06 MED ORDER — BISACODYL 10 MG RE SUPP: 10.0000 mg | Freq: Every day | RECTAL | Status: DC | PRN

## 2016-11-06 MED ORDER — AMLODIPINE BESYLATE 5 MG PO TABS
5.0000 mg | ORAL_TABLET | Freq: Two times a day (BID) | ORAL | Status: DC
  Administered 2016-11-06 – 2016-11-08 (×3): 5 mg via ORAL
  Filled 2016-11-06 (×4): qty 1

## 2016-11-06 MED ORDER — POTASSIUM CHLORIDE CRYS ER 20 MEQ PO TBCR
20.0000 meq | EXTENDED_RELEASE_TABLET | Freq: Every day | ORAL | Status: DC
  Administered 2016-11-07 – 2016-11-08 (×2): 20 meq via ORAL
  Filled 2016-11-06 (×2): qty 1

## 2016-11-06 MED ORDER — CANAGLIFLOZIN 100 MG PO TABS
100.0000 mg | ORAL_TABLET | Freq: Every day | ORAL | Status: DC
  Administered 2016-11-07 – 2016-11-08 (×2): 100 mg via ORAL
  Filled 2016-11-06 (×2): qty 1

## 2016-11-06 MED ORDER — MENTHOL 3 MG MT LOZG: 1.0000 | LOZENGE | OROMUCOSAL | Status: DC | PRN

## 2016-11-06 MED ORDER — MIDAZOLAM HCL 5 MG/5ML IJ SOLN
INTRAMUSCULAR | Status: DC | PRN
  Administered 2016-11-06: 1 mg via INTRAVENOUS

## 2016-11-06 MED ORDER — LIRAGLUTIDE 18 MG/3ML ~~LOC~~ SOPN: 1.8000 mg | PEN_INJECTOR | Freq: Every day | SUBCUTANEOUS | Status: DC

## 2016-11-06 MED ORDER — ONDANSETRON HCL 4 MG/2ML IJ SOLN
INTRAMUSCULAR | Status: DC | PRN
  Administered 2016-11-06: 4 mg via INTRAVENOUS

## 2016-11-06 MED ORDER — INSULIN DEGLUDEC 100 UNIT/ML ~~LOC~~ SOPN: 20.0000 [IU] | PEN_INJECTOR | Freq: Every day | SUBCUTANEOUS | Status: DC

## 2016-11-06 MED ORDER — FENTANYL CITRATE (PF) 100 MCG/2ML IJ SOLN
50.0000 ug | INTRAMUSCULAR | Status: DC | PRN
  Administered 2016-11-06: 50 ug via INTRAVENOUS
  Administered 2016-11-06: 25 ug via INTRAVENOUS

## 2016-11-06 MED ORDER — OXYCODONE HCL 5 MG PO TABS
5.0000 mg | ORAL_TABLET | ORAL | Status: DC | PRN
  Administered 2016-11-06: 5 mg via ORAL
  Administered 2016-11-06: 23:00:00 10 mg via ORAL
  Administered 2016-11-06: 5 mg via ORAL
  Administered 2016-11-07 (×3): 10 mg via ORAL
  Filled 2016-11-06 (×2): qty 2
  Filled 2016-11-06 (×2): qty 1
  Filled 2016-11-06 (×2): qty 2

## 2016-11-06 MED ORDER — DEXAMETHASONE SODIUM PHOSPHATE 10 MG/ML IJ SOLN: 10.0000 mg | Freq: Once | INTRAMUSCULAR | Status: DC

## 2016-11-06 MED ORDER — PSYLLIUM 95 % PO PACK
1.0000 | PACK | Freq: Every day | ORAL | Status: DC
  Administered 2016-11-07 – 2016-11-08 (×2): 1 via ORAL
  Filled 2016-11-06 (×2): qty 1

## 2016-11-06 MED ORDER — LACTATED RINGERS IV SOLN: INTRAVENOUS | Status: DC

## 2016-11-06 MED ORDER — LACTATED RINGERS IV SOLN
INTRAVENOUS | Status: DC
  Administered 2016-11-06 (×2): via INTRAVENOUS
  Administered 2016-11-06: 1000 mL via INTRAVENOUS

## 2016-11-06 MED ORDER — POLYETHYLENE GLYCOL 3350 17 G PO PACK
17.0000 g | PACK | Freq: Every day | ORAL | Status: DC | PRN
  Administered 2016-11-08: 10:00:00 17 g via ORAL
  Filled 2016-11-06: qty 1

## 2016-11-06 MED ORDER — CEFAZOLIN SODIUM-DEXTROSE 2-4 GM/100ML-% IV SOLN
2.0000 g | Freq: Four times a day (QID) | INTRAVENOUS | Status: AC
  Administered 2016-11-06 – 2016-11-07 (×2): 2 g via INTRAVENOUS
  Filled 2016-11-06 (×2): qty 100

## 2016-11-06 MED ORDER — BUPIVACAINE HCL (PF) 0.25 % IJ SOLN
INTRAMUSCULAR | Status: AC
  Filled 2016-11-06: qty 30

## 2016-11-06 MED ORDER — ONDANSETRON HCL 4 MG/2ML IJ SOLN
INTRAMUSCULAR | Status: AC
  Filled 2016-11-06: qty 2

## 2016-11-06 MED ORDER — PROMETHAZINE HCL 25 MG PO TABS
12.5000 mg | ORAL_TABLET | Freq: Four times a day (QID) | ORAL | Status: DC | PRN
  Administered 2016-11-07 (×2): 25 mg via ORAL
  Filled 2016-11-06 (×2): qty 1

## 2016-11-06 MED ORDER — FLUTICASONE PROPIONATE 50 MCG/ACT NA SUSP: 2.0000 | Freq: Every day | NASAL | Status: DC | PRN

## 2016-11-06 MED ORDER — PHENYLEPHRINE HCL 10 MG/ML IJ SOLN
INTRAMUSCULAR | Status: AC
  Filled 2016-11-06: qty 1

## 2016-11-06 MED ORDER — METOPROLOL SUCCINATE ER 50 MG PO TB24
100.0000 mg | ORAL_TABLET | ORAL | Status: DC
Start: 2016-11-06 — End: 2016-11-06

## 2016-11-06 MED ORDER — ACETAMINOPHEN 500 MG PO TABS
1000.0000 mg | ORAL_TABLET | Freq: Four times a day (QID) | ORAL | Status: AC
  Administered 2016-11-06 – 2016-11-07 (×4): 1000 mg via ORAL
  Filled 2016-11-06 (×3): qty 2

## 2016-11-06 MED ORDER — TRANEXAMIC ACID 1000 MG/10ML IV SOLN
INTRAVENOUS | Status: DC | PRN
  Administered 2016-11-06: 2000 mg via TOPICAL

## 2016-11-06 MED ORDER — METOPROLOL SUCCINATE ER 50 MG PO TB24
150.0000 mg | ORAL_TABLET | Freq: Every day | ORAL | Status: DC
  Administered 2016-11-07 – 2016-11-08 (×2): 150 mg via ORAL
  Filled 2016-11-06 (×2): qty 3

## 2016-11-06 MED ORDER — LORATADINE 10 MG PO TABS
10.0000 mg | ORAL_TABLET | Freq: Every day | ORAL | Status: DC
  Administered 2016-11-06 – 2016-11-07 (×2): 10 mg via ORAL
  Filled 2016-11-06 (×2): qty 1

## 2016-11-06 MED ORDER — TRAMADOL HCL 50 MG PO TABS
50.0000 mg | ORAL_TABLET | Freq: Four times a day (QID) | ORAL | Status: DC | PRN
  Administered 2016-11-07: 13:00:00 100 mg via ORAL
  Filled 2016-11-06: qty 2

## 2016-11-06 MED ORDER — PHENYLEPHRINE HCL 10 MG/ML IJ SOLN
INTRAMUSCULAR | Status: DC | PRN
  Administered 2016-11-06 (×3): 80 ug via INTRAVENOUS

## 2016-11-06 MED ORDER — ACETAMINOPHEN 650 MG RE SUPP: 650.0000 mg | Freq: Four times a day (QID) | RECTAL | Status: DC | PRN

## 2016-11-06 MED ORDER — LEVOTHYROXINE SODIUM 50 MCG PO TABS
50.0000 ug | ORAL_TABLET | Freq: Every day | ORAL | Status: DC
  Administered 2016-11-07 – 2016-11-08 (×2): 50 ug via ORAL
  Filled 2016-11-06 (×2): qty 1

## 2016-11-06 MED ORDER — DEXAMETHASONE SODIUM PHOSPHATE 10 MG/ML IJ SOLN
INTRAMUSCULAR | Status: AC
  Filled 2016-11-06: qty 1

## 2016-11-06 MED ORDER — LOSARTAN POTASSIUM 50 MG PO TABS
50.0000 mg | ORAL_TABLET | Freq: Two times a day (BID) | ORAL | Status: DC
  Administered 2016-11-06 – 2016-11-08 (×3): 50 mg via ORAL
  Filled 2016-11-06 (×4): qty 1

## 2016-11-06 MED ORDER — PROPOFOL 500 MG/50ML IV EMUL
INTRAVENOUS | Status: DC | PRN
  Administered 2016-11-06: 75 ug/kg/min via INTRAVENOUS

## 2016-11-06 MED ORDER — PROMETHAZINE HCL 25 MG/ML IJ SOLN
6.2500 mg | Freq: Four times a day (QID) | INTRAMUSCULAR | Status: DC | PRN
  Administered 2016-11-07: 6.25 mg via INTRAVENOUS
  Filled 2016-11-06: qty 1

## 2016-11-06 MED ORDER — BUPIVACAINE LIPOSOME 1.3 % IJ SUSP
INTRAMUSCULAR | Status: DC | PRN
  Administered 2016-11-06: 20 mL

## 2016-11-06 MED ORDER — INSULIN GLARGINE 100 UNIT/ML ~~LOC~~ SOLN
20.0000 [IU] | Freq: Every day | SUBCUTANEOUS | Status: DC
  Administered 2016-11-07: 20 [IU] via SUBCUTANEOUS
  Filled 2016-11-06 (×2): qty 0.2

## 2016-11-06 MED ORDER — SODIUM CHLORIDE 0.9 % IJ SOLN
INTRAMUSCULAR | Status: DC | PRN
  Administered 2016-11-06: 30 mL

## 2016-11-06 MED ORDER — TRANEXAMIC ACID 1000 MG/10ML IV SOLN
2000.0000 mg | Freq: Once | INTRAVENOUS | Status: DC
  Filled 2016-11-06: qty 20

## 2016-11-06 MED ORDER — PROPOFOL 10 MG/ML IV BOLUS
INTRAVENOUS | Status: AC
  Filled 2016-11-06: qty 20

## 2016-11-06 MED ORDER — COLCHICINE 0.6 MG PO TABS
0.6000 mg | ORAL_TABLET | Freq: Every day | ORAL | Status: DC | PRN
  Filled 2016-11-06: qty 1

## 2016-11-06 MED ORDER — PROMETHAZINE HCL 25 MG RE SUPP: 25.0000 mg | Freq: Four times a day (QID) | RECTAL | Status: DC | PRN

## 2016-11-06 MED ORDER — BUPIVACAINE HCL 0.25 % IJ SOLN
INTRAMUSCULAR | Status: DC | PRN
  Administered 2016-11-06: 20 mL

## 2016-11-06 MED ORDER — PHENOL 1.4 % MT LIQD: 1.0000 | OROMUCOSAL | Status: DC | PRN

## 2016-11-06 MED ORDER — DEXAMETHASONE SODIUM PHOSPHATE 10 MG/ML IJ SOLN
10.0000 mg | Freq: Once | INTRAMUSCULAR | Status: AC
  Administered 2016-11-06: 10 mg via INTRAVENOUS

## 2016-11-06 MED ORDER — APIXABAN 2.5 MG PO TABS
2.5000 mg | ORAL_TABLET | Freq: Two times a day (BID) | ORAL | Status: DC
  Administered 2016-11-07 – 2016-11-08 (×3): 2.5 mg via ORAL
  Filled 2016-11-06 (×3): qty 1

## 2016-11-06 MED ORDER — ATORVASTATIN CALCIUM 20 MG PO TABS
80.0000 mg | ORAL_TABLET | Freq: Every day | ORAL | Status: DC
  Administered 2016-11-06 – 2016-11-07 (×2): 80 mg via ORAL
  Filled 2016-11-06 (×2): qty 4

## 2016-11-06 MED ORDER — ACETAMINOPHEN 325 MG PO TABS
650.0000 mg | ORAL_TABLET | Freq: Four times a day (QID) | ORAL | Status: DC | PRN
  Administered 2016-11-08: 650 mg via ORAL
  Filled 2016-11-06: qty 2

## 2016-11-06 MED ORDER — SODIUM CHLORIDE 0.9 % IV SOLN
INTRAVENOUS | Status: DC
  Administered 2016-11-06: 22:00:00 via INTRAVENOUS

## 2016-11-06 MED ORDER — EPHEDRINE 5 MG/ML INJ
INTRAVENOUS | Status: AC
  Filled 2016-11-06: qty 10

## 2016-11-06 SURGICAL SUPPLY — 52 items
BAG DECANTER FOR FLEXI CONT (MISCELLANEOUS) ×3 IMPLANT
BAG ZIPLOCK 12X15 (MISCELLANEOUS) ×3 IMPLANT
BANDAGE ACE 6X5 VEL STRL LF (GAUZE/BANDAGES/DRESSINGS) ×3 IMPLANT
BANDAGE ELASTIC 6 VELCRO ST LF (GAUZE/BANDAGES/DRESSINGS) ×3 IMPLANT
BLADE SAG 18X100X1.27 (BLADE) ×3 IMPLANT
BLADE SAW SGTL 11.0X1.19X90.0M (BLADE) ×3 IMPLANT
BOWL SMART MIX CTS (DISPOSABLE) ×3 IMPLANT
CAP KNEE TOTAL 3 SIGMA ×3 IMPLANT
CEMENT HV SMART SET (Cement) ×6 IMPLANT
CLOSURE WOUND 1/2 X4 (GAUZE/BANDAGES/DRESSINGS) ×1
CLOTH BEACON ORANGE TIMEOUT ST (SAFETY) ×3 IMPLANT
CUFF TOURN SGL QUICK 34 (TOURNIQUET CUFF) ×2
CUFF TRNQT CYL 34X4X40X1 (TOURNIQUET CUFF) ×1 IMPLANT
DECANTER SPIKE VIAL GLASS SM (MISCELLANEOUS) ×3 IMPLANT
DRAPE U-SHAPE 47X51 STRL (DRAPES) ×3 IMPLANT
DRSG ADAPTIC 3X8 NADH LF (GAUZE/BANDAGES/DRESSINGS) ×3 IMPLANT
DRSG PAD ABDOMINAL 8X10 ST (GAUZE/BANDAGES/DRESSINGS) ×3 IMPLANT
DURAPREP 26ML APPLICATOR (WOUND CARE) ×3 IMPLANT
ELECT REM PT RETURN 9FT ADLT (ELECTROSURGICAL) ×3
ELECTRODE REM PT RTRN 9FT ADLT (ELECTROSURGICAL) ×1 IMPLANT
EVACUATOR 1/8 PVC DRAIN (DRAIN) ×3 IMPLANT
GAUZE SPONGE 4X4 12PLY STRL (GAUZE/BANDAGES/DRESSINGS) ×3 IMPLANT
GLOVE BIO SURGEON STRL SZ7.5 (GLOVE) IMPLANT
GLOVE BIO SURGEON STRL SZ8 (GLOVE) ×3 IMPLANT
GLOVE BIOGEL PI IND STRL 6.5 (GLOVE) IMPLANT
GLOVE BIOGEL PI IND STRL 8 (GLOVE) ×1 IMPLANT
GLOVE BIOGEL PI INDICATOR 6.5 (GLOVE)
GLOVE BIOGEL PI INDICATOR 8 (GLOVE) ×2
GLOVE SURG SS PI 6.5 STRL IVOR (GLOVE) IMPLANT
GOWN STRL REUS W/TWL LRG LVL3 (GOWN DISPOSABLE) ×3 IMPLANT
GOWN STRL REUS W/TWL XL LVL3 (GOWN DISPOSABLE) IMPLANT
HANDPIECE INTERPULSE COAX TIP (DISPOSABLE) ×2
IMMOBILIZER KNEE 20 (SOFTGOODS) ×3
IMMOBILIZER KNEE 20 THIGH 36 (SOFTGOODS) ×1 IMPLANT
MANIFOLD NEPTUNE II (INSTRUMENTS) ×3 IMPLANT
NS IRRIG 1000ML POUR BTL (IV SOLUTION) ×3 IMPLANT
PACK TOTAL KNEE CUSTOM (KITS) ×3 IMPLANT
PAD ABD 8X10 STRL (GAUZE/BANDAGES/DRESSINGS) ×3 IMPLANT
PADDING CAST COTTON 6X4 STRL (CAST SUPPLIES) ×3 IMPLANT
POSITIONER SURGICAL ARM (MISCELLANEOUS) ×3 IMPLANT
SET HNDPC FAN SPRY TIP SCT (DISPOSABLE) ×1 IMPLANT
STRIP CLOSURE SKIN 1/2X4 (GAUZE/BANDAGES/DRESSINGS) ×2 IMPLANT
SUT MNCRL AB 4-0 PS2 18 (SUTURE) ×3 IMPLANT
SUT VIC AB 2-0 CT1 27 (SUTURE) ×6
SUT VIC AB 2-0 CT1 TAPERPNT 27 (SUTURE) ×3 IMPLANT
SUT VLOC 180 0 24IN GS25 (SUTURE) ×3 IMPLANT
SYR 50ML LL SCALE MARK (SYRINGE) ×3 IMPLANT
TRAY FOLEY W/METER SILVER 16FR (SET/KITS/TRAYS/PACK) ×3 IMPLANT
WATER STERILE IRR 1000ML POUR (IV SOLUTION) ×6 IMPLANT
WATER STERILE IRR 1500ML POUR (IV SOLUTION) ×3 IMPLANT
WRAP KNEE MAXI GEL POST OP (GAUZE/BANDAGES/DRESSINGS) ×3 IMPLANT
YANKAUER SUCT BULB TIP 10FT TU (MISCELLANEOUS) ×3 IMPLANT

## 2016-11-06 NOTE — Op Note (Signed)
OPERATIVE REPORT-TOTAL KNEE ARTHROPLASTY   Pre-operative diagnosis- Osteoarthritis  Left knee(s)  Post-operative diagnosis- Osteoarthritis Left knee(s)  Procedure-  Left  Total Knee Arthroplasty  Surgeon- Dione Plover. Dewain Platz, MD  Assistant- Arlee Muslim, PA-C   Anesthesia-  Adductor block and spinal  EBL-* No blood loss amount entered *   Drains Hemovac  Tourniquet time-  Total Tourniquet Time Documented: Thigh (Left) - 32 minutes Total: Thigh (Left) - 32 minutes     Complications- None  Condition-PACU - hemodynamically stable.   Brief Clinical Note   Colton Brooks is a 75 y.o. year old male with end stage OA of his left knee with progressively worsening pain and dysfunction. He has constant pain, with activity and at rest and significant functional deficits with difficulties even with ADLs. He has had extensive non-op management including analgesics, injections of cortisone, and home exercise program, but remains in significant pain with significant dysfunction. Radiographs show bone on bone arthritis medial and patellofemoral. He presents now for left Total Knee Arthroplasty.     Procedure in detail---   The patient is brought into the operating room and positioned supine on the operating table. After successful administration of Adductor block and spinal ,   a tourniquet is placed high on the  Left thigh(s) and the lower extremity is prepped and draped in the usual sterile fashion. Time out is performed by the operating team and then the  Left lower extremity is wrapped in Esmarch, knee flexed and the tourniquet inflated to 300 mmHg.       A midline incision is made with a ten blade through the subcutaneous tissue to the level of the extensor mechanism. A fresh blade is used to make a medial parapatellar arthrotomy. Soft tissue over the proximal medial tibia is subperiosteally elevated to the joint line with a knife and into the semimembranosus bursa with a Cobb elevator.  Soft tissue over the proximal lateral tibia is elevated with attention being paid to avoiding the patellar tendon on the tibial tubercle. The patella is everted, knee flexed 90 degrees and the ACL and PCL are removed. Findings are bone on bone medial and patellofemoral with large global osteophytes.        The drill is used to create a starting hole in the distal femur and the canal is thoroughly irrigated with sterile saline to remove the fatty contents. The 5 degree Left  valgus alignment guide is placed into the femoral canal and the distal femoral cutting block is pinned to remove 10 mm off the distal femur. Resection is made with an oscillating saw.      The tibia is subluxed forward and the menisci are removed. The extramedullary alignment guide is placed referencing proximally at the medial aspect of the tibial tubercle and distally along the second metatarsal axis and tibial crest. The block is pinned to remove 76mm off the more deficient medial  side. Resection is made with an oscillating saw. Size 4is the most appropriate size for the tibia and the proximal tibia is prepared with the modular drill and keel punch for that size.      The femoral sizing guide is placed and size 4 is most appropriate. Rotation is marked off the epicondylar axis and confirmed by creating a rectangular flexion gap at 90 degrees. The size 4 cutting block is pinned in this rotation and the anterior, posterior and chamfer cuts are made with the oscillating saw. The intercondylar block is then placed and that cut  is made.      Trial size 4 tibial component, trial size 4 posterior stabilized femur and a 12.5  mm posterior stabilized rotating platform insert trial is placed. Full extension is achieved with excellent varus/valgus and anterior/posterior balance throughout full range of motion. The patella is everted and thickness measured to be 25  mm. Free hand resection is taken to 15 mm, a 41 template is placed, lug holes are  drilled, trial patella is placed, and it tracks normally. Osteophytes are removed off the posterior femur with the trial in place. All trials are removed and the cut bone surfaces prepared with pulsatile lavage. Cement is mixed and once ready for implantation, the size 4 tibial implant, size  4 posterior stabilized femoral component, and the size 41 patella are cemented in place and the patella is held with the clamp. The trial insert is placed and the knee held in full extension. The Exparel (20 ml mixed with 30 ml saline) and .25% Bupivicaine, are injected into the extensor mechanism, posterior capsule, medial and lateral gutters and subcutaneous tissues.  All extruded cement is removed and once the cement is hard the permanent 12.5 mm posterior stabilized rotating platform insert is placed into the tibial tray.      The wound is copiously irrigated with saline solution and the extensor mechanism closed over a hemovac drain with #1 V-loc suture. The tourniquet is released for a total tourniquet time of 32  minutes. Flexion against gravity is 140 degrees and the patella tracks normally. Subcutaneous tissue is closed with 2.0 vicryl and subcuticular with running 4.0 Monocryl. The incision is cleaned and dried and steri-strips and a bulky sterile dressing are applied. The limb is placed into a knee immobilizer and the patient is awakened and transported to recovery in stable condition.      Please note that a surgical assistant was a medical necessity for this procedure in order to perform it in a safe and expeditious manner. Surgical assistant was necessary to retract the ligaments and vital neurovascular structures to prevent injury to them and also necessary for proper positioning of the limb to allow for anatomic placement of the prosthesis.   Dione Plover Erla Bacchi, MD    11/06/2016, 4:10 PM

## 2016-11-06 NOTE — Progress Notes (Signed)
I/O done in pacu per Dr Synthia Innocent request

## 2016-11-06 NOTE — Transfer of Care (Signed)
Immediate Anesthesia Transfer of Care Note  Patient: Colton Brooks  Procedure(s) Performed: Procedure(s): LEFT TOTAL KNEE ARTHROPLASTY (Left)  Patient Location: PACU  Anesthesia Type:Spinal  Level of Consciousness:  sedated, patient cooperative and responds to stimulation  Airway & Oxygen Therapy:Patient Spontanous Breathing and Patient connected to face mask oxgen  Post-op Assessment:  Report given to PACU RN and Post -op Vital signs reviewed and stable  Post vital signs:  Reviewed and stable  Last Vitals:  Vitals:   11/06/16 1432 11/06/16 1433  BP:    Pulse: (!) 54 (!) 59  Resp: 12 11  Temp:      Complications: No apparent anesthesia complications

## 2016-11-06 NOTE — Anesthesia Preprocedure Evaluation (Signed)
Anesthesia Evaluation  Patient identified by MRN, date of birth, ID band Patient awake    Reviewed: Allergy & Precautions, NPO status , Patient's Chart, lab work & pertinent test results  Airway Mallampati: II  TM Distance: >3 FB Neck ROM: Full    Dental no notable dental hx.    Pulmonary asthma , sleep apnea , former smoker,    Pulmonary exam normal breath sounds clear to auscultation       Cardiovascular hypertension, Normal cardiovascular exam Rhythm:Regular Rate:Normal     Neuro/Psych negative neurological ROS  negative psych ROS   GI/Hepatic negative GI ROS, Neg liver ROS,   Endo/Other  diabetesHypothyroidism   Renal/GU negative Renal ROS  negative genitourinary   Musculoskeletal negative musculoskeletal ROS (+)   Abdominal   Peds negative pediatric ROS (+)  Hematology negative hematology ROS (+)   Anesthesia Other Findings   Reproductive/Obstetrics negative OB ROS                             Anesthesia Physical Anesthesia Plan  ASA: III  Anesthesia Plan: Spinal   Post-op Pain Management:    Induction: Intravenous  Airway Management Planned: Simple Face Mask  Additional Equipment:   Intra-op Plan:   Post-operative Plan:   Informed Consent: I have reviewed the patients History and Physical, chart, labs and discussed the procedure including the risks, benefits and alternatives for the proposed anesthesia with the patient or authorized representative who has indicated his/her understanding and acceptance.   Dental advisory given  Plan Discussed with: CRNA and Surgeon  Anesthesia Plan Comments:         Anesthesia Quick Evaluation

## 2016-11-06 NOTE — Anesthesia Procedure Notes (Signed)
Spinal  Patient location during procedure: OR Start time: 11/06/2016 3:00 PM End time: 11/06/2016 3:04 PM Staffing Resident/CRNA: Anis Degidio Performed: resident/CRNA  Preanesthetic Checklist Completed: patient identified, site marked, surgical consent, pre-op evaluation, timeout performed, IV checked, risks and benefits discussed and monitors and equipment checked Spinal Block Patient position: sitting Prep: Betadine Patient monitoring: heart rate, cardiac monitor, continuous pulse ox and blood pressure Approach: midline Location: L3-4 Injection technique: single-shot Needle Needle type: Spinocan  Needle gauge: 22 G Needle length: 9 cm Needle insertion depth: 8 cm Assessment Sensory level: T6 Additional Notes -heme, -para, VSS.  Lot and exp date checked and OK.

## 2016-11-06 NOTE — Anesthesia Postprocedure Evaluation (Signed)
Anesthesia Post Note  Patient: Colton Brooks  Procedure(s) Performed: Procedure(s) (LRB): LEFT TOTAL KNEE ARTHROPLASTY (Left)  Patient location during evaluation: PACU Anesthesia Type: Spinal Level of consciousness: oriented and awake and alert Pain management: pain level controlled Vital Signs Assessment: post-procedure vital signs reviewed and stable Respiratory status: spontaneous breathing, respiratory function stable and patient connected to nasal cannula oxygen Cardiovascular status: blood pressure returned to baseline and stable Postop Assessment: no headache and no backache Anesthetic complications: no       Last Vitals:  Vitals:   11/06/16 1754 11/06/16 1806  BP: 117/78 122/69  Pulse: (!) 54   Resp: 12 14  Temp:  36.5 C    Last Pain:  Vitals:   11/06/16 1639  TempSrc:   PainSc: 0-No pain                 Lyon Dumont S

## 2016-11-06 NOTE — Interval H&P Note (Signed)
History and Physical Interval Note:  11/06/2016 2:48 PM  Colton Brooks  has presented today for surgery, with the diagnosis of LEFT KNEE OA  The various methods of treatment have been discussed with the patient and family. After consideration of risks, benefits and other options for treatment, the patient has consented to  Procedure(s): LEFT TOTAL KNEE ARTHROPLASTY (Left) as a surgical intervention .  The patient's history has been reviewed, patient examined, no change in status, stable for surgery.  I have reviewed the patient's chart and labs.  Questions were answered to the patient's satisfaction.     [name]

## 2016-11-06 NOTE — Progress Notes (Signed)
Assisted Dr. Rose with left, ultrasound guided, adductor canal block. Side rails up, monitors on throughout procedure. See vital signs in flow sheet. Tolerated Procedure well.  

## 2016-11-06 NOTE — Anesthesia Procedure Notes (Signed)
Anesthesia Regional Block:  Adductor canal block  Pre-Anesthetic Checklist: ,, timeout performed, Correct Patient, Correct Site, Correct Laterality, Correct Procedure, Correct Position, site marked, Risks and benefits discussed,  Surgical consent,  Pre-op evaluation,  At surgeon's request and post-op pain management  Laterality: Left  Prep: chloraprep       Needles:  Injection technique: Single-shot  Needle Type: Echogenic Needle     Needle Length: 9cm 9 cm Needle Gauge: 21 G    Additional Needles:  Procedures: ultrasound guided (picture in chart) Adductor canal block Narrative:  Start time: 11/06/2016 2:20 PM End time: 11/06/2016 2:27 PM Injection made incrementally with aspirations every 5 mL.  Performed by: Personally  Anesthesiologist: Jachelle Fluty  Additional Notes: Patient tolerated the procedure well without complications

## 2016-11-06 NOTE — H&P (View-Only) (Signed)
Colton Brooks DOB: 1941-09-05 Married / Language: English / Race: White Male Date of Admission:  11/06/2017 CC:  Left knee pain History of Present Illness The patient is a 75 year old male who comes in  for a preoperative History and Physical. The patient is scheduled for a left total knee arthroplasty to be performed by Dr. Dione Plover. Aluisio, MD at [facility] on 11-06-2016. The patient is a 75 year old male who presented with knee complaints. The patient reports left knee symptoms including: pain, swelling, instability, locking, catching, giving way, weakness, stiffness, soreness and grinding which began 7 year(s) ago without any known injury. The patient describes their pain as sharp, dull, aching, burning, stinging and throbbing.The patient feels that the symptoms are worsening. The patient has the current diagnosis of knee osteoarthritis. Past treatment for this problem has included intra-articular injection of corticosteroids (cortisone and visco: did not help) and nonsteroidal anti-inflammatory drugs. Symptoms are reported to be located in the left knee and include knee pain. The patient does not report any radiation of symptoms. Current treatment includes Oxycodone 5MG  given by Dr. Brigitte Pulse. Unfortunately, his left knee is at a stage where the right one was before he had that replaced. Knee is hurting at all times. It has given out on him. He does get swelling. Has not locked up. It is limiting what he can and cannot do. It even hurts him at night. He is ready now to get the left knee replaced. They have been treated conservatively in the past for the above stated problem and despite conservative measures, they continue to have progressive pain and severe functional limitations and dysfunction. They have failed non-operative management including home exercise, medications, and injections. It is felt that they would benefit from undergoing total joint replacement. Risks and benefits of the  procedure have been discussed with the patient and they elect to proceed with surgery. There are no active contraindications to surgery such as ongoing infection or rapidly progressive neurological disease.   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 Cerebrovascular Accident  Father, Mother. Chronic Obstructive Lung Disease  Brother, Father. Diabetes Mellitus  Father, Mother. Drug / Alcohol Addiction  Brother. Heart Disease  Mother. Hypertension  Father, Mother, Sister. Kidney disease  Sister. Osteoarthritis  Mother.  Social History Children  2 Current drinker  08/24/2016: Currently drinks beer and hard liquor only occasionally per week Current work status  retired Furniture conservator/restorer daily; does other and gym / [employer] Living situation  live with spouse Marital status  married No history of drug/alcohol rehab  Not under pain contract  Number of flights of stairs before winded  1 Tobacco / smoke exposure  08/24/2016: no Tobacco use  Former smoker. 08/24/2016: smoke(d) less than 1/2 pack(s) per day Advance Directives  Living Will, Healthcare POA  Medication  History  BD Pen Needle Short U/F (31G X 8 MM Misc,) Active. Losartan Potassium (100MG  Tablet, Oral) Active. Proctosol HC (2.5% Cream, Rectal) Active. Victoza (18MG /3ML Soln Pen-inj, Subcutaneous) Active. Zofran (4MG  Tablet, Oral) Active. Lumigan (0.01% Solution, Ophthalmic) Active. Eliquis (5MG  Tablet, Oral) Active. Turmeric Active. Simethicone Active. Uloric (80MG  Tablet, Oral) Active. Voltaren Gel Active. AndroGel Pump (20.25 MG/ACT(1.62%) Gel, Transdermal) Active. Desonide (0.05% Cream, External) Active. Furosemide (20MG  Tablet, Oral) Active. HydroCHLOROthiazide (25MG  Tablet, Oral) Active. Potassium Chloride Crys ER (20MEQ Tablet ER, Oral) Active. Atorvastatin Calcium (80MG  Tablet, Oral) Active. MetFORMIN HCl (1000MG  Tablet, Oral) Active. Pantoprazole Sodium (40MG  Tablet DR, Oral) Active. Qualaquin Active. Metaxalone (800MG  Tablet, Oral) Active. Montelukast Sodium (10MG  Tablet, Oral) Active. Collagen 500 mg Active. Fluticasone Propionate (50MCG/ACT Suspension, Nasal) Active. Azelastine Nasal Spray Active. Synthroid (50MCG Tablet, Oral) Active. CoQ10 Active. OMEGA-3 Krill Oil Active. Claritin 10 mg Active. ProAir HFA Active. ColCrys 0.6 Active. Toprol XL (100MG  Tablet ER 24HR, Oral) Active. Creon (24000UNIT Capsule DR Part, Oral) Active. Safflower 420 mg Active. OxyCODONE HCl (5MG  Tablet, Oral) Active.  Past Surgical History Arthroscopy of Knee  right Cataract Surgery  bilateral Inguinal Hernia Repair  laparoscopic: bilateral Tonsillectomy  Total Knee Replacement  right   Review of Systems  General Present- Fatigue and Night Sweats. Not Present- Chills, Fever, Memory Loss, Weight Gain and Weight Loss. Skin Not Present- Eczema, Hives, Itching, Lesions and Rash. HEENT Present- Hearing Loss and Tinnitus. Not Present- Dentures, Double Vision, Headache and Visual Loss. Respiratory Present- Cough and Shortness of breath with  exertion. Not Present- Allergies, Chronic Cough, Coughing up blood and Shortness of breath at rest. Cardiovascular Present- Difficulty Breathing Lying Down (due to sleep apena). Not Present- Chest Pain, Murmur, Palpitations, Racing/skipping heartbeats and Swelling. Gastrointestinal Present- Diarrhea and Nausea. Not Present- Abdominal Pain, Bloody Stool, Constipation, Difficulty Swallowing, Heartburn, Jaundice, Loss of appetitie and Vomiting. Male Genitourinary Present- Urinary frequency and Weak urinary stream. Not Present- Blood in Urine, Discharge, Flank Pain, Incontinence, Painful Urination, Urgency, Urinary Retention and Urinating at Night. Musculoskeletal Present- Back Pain, Joint Pain, Joint Swelling, Morning Stiffness, Muscle Pain, Muscle Weakness and Spasms. Neurological Not Present- Blackout spells, Difficulty with balance, Dizziness, Paralysis, Tremor and Weakness. Psychiatric Not Present- Insomnia.  Vitals Weight: 231 lb Height: 69in Weight was reported by patient. Height was reported by patient. Body Surface Area: 2.2 m Body Mass Index: 34.11 kg/m  Pulse: 56 (Regular)  BP: 128/68 (Sitting, Right Arm, Standard)   Physical Exam  General Mental Status -Alert, cooperative and good historian. General Appearance-pleasant, Not in acute distress. Orientation-Oriented X3. Build & Nutrition-Well nourished and Well developed.  Head and Neck Head-normocephalic, atraumatic . Neck Global Assessment - supple, no bruit auscultated on the right, no bruit auscultated on the left.  Eye Vision-Wears corrective lenses. Pupil - Bilateral-Regular and Round. Motion - Bilateral-EOMI.  Chest and Lung Exam Auscultation Breath sounds - clear at anterior chest wall and clear at posterior chest wall. Adventitious sounds - No Adventitious sounds.  Cardiovascular Auscultation Rhythm - Irregularly irregular(known atrial fib). Heart Sounds - S1 WNL and S2 WNL. Murmurs &  Other Heart Sounds - Auscultation of the heart reveals - No Murmurs.  Abdomen Palpation/Percussion Tenderness - Abdomen is non-tender to palpation. Rigidity (guarding) - Abdomen is soft. Auscultation Auscultation of the abdomen reveals - Bowel sounds normal.  Male Genitourinary Note: Not done, not pertinent to present illness   Musculoskeletal Note: He is in no distress. His hips show normal range of motion  with no discomfort. His right knee shows no effusion. Range of motion right knee is 0 to 115. He is nontender about that knee. There is no instability. Left knee, no effusion. Varus deformity, range 5 to 125, moderate crepitus on range of motion. Tender medial greater than lateral, no instability noted.  RADIOGRAPHS AP both knees and lateral show the prosthesis on the right in excellent position, no periprosthetic abnormalities. On the left, he has got bone on bone arthritis in the medial and patellofemoral compartments of the left knee.   Assessment & Plan Primary osteoarthritis of left knee (M17.12)  Note:Surgical Plans: Left Total Knee Replacement  Disposition:   PCP: Dr. Lang Snow Cards: Dr. Houston Siren  Topical TXA  Anesthesia Issues: Nausea and Vertigo  Signed electronically by Ok Edwards, III PA-C

## 2016-11-07 ENCOUNTER — Encounter (HOSPITAL_COMMUNITY): Payer: Self-pay | Admitting: Orthopedic Surgery

## 2016-11-07 LAB — BASIC METABOLIC PANEL
Anion gap: 7 (ref 5–15)
BUN: 21 mg/dL — AB (ref 6–20)
CHLORIDE: 103 mmol/L (ref 101–111)
CO2: 26 mmol/L (ref 22–32)
Calcium: 8.7 mg/dL — ABNORMAL LOW (ref 8.9–10.3)
Creatinine, Ser: 1.43 mg/dL — ABNORMAL HIGH (ref 0.61–1.24)
GFR calc Af Amer: 53 mL/min — ABNORMAL LOW (ref >60)
GFR, EST NON AFRICAN AMERICAN: 46 mL/min — AB (ref >60)
GLUCOSE: 192 mg/dL — AB (ref 65–99)
POTASSIUM: 3.9 mmol/L (ref 3.5–5.1)
Sodium: 136 mmol/L (ref 135–145)

## 2016-11-07 LAB — CBC
HEMATOCRIT: 43.4 % (ref 39.0–52.0)
Hemoglobin: 13.9 g/dL (ref 13.0–17.0)
MCH: 25.3 pg — AB (ref 26.0–34.0)
MCHC: 32 g/dL (ref 30.0–36.0)
MCV: 78.9 fL (ref 78.0–100.0)
PLATELETS: 188 10*3/uL (ref 150–400)
RBC: 5.5 MIL/uL (ref 4.22–5.81)
RDW: 17.8 % — ABNORMAL HIGH (ref 11.5–15.5)
WBC: 9.9 10*3/uL (ref 4.0–10.5)

## 2016-11-07 LAB — GLUCOSE, CAPILLARY
GLUCOSE-CAPILLARY: 181 mg/dL — AB (ref 65–99)
GLUCOSE-CAPILLARY: 172 mg/dL — AB (ref 65–99)
Glucose-Capillary: 176 mg/dL — ABNORMAL HIGH (ref 65–99)
Glucose-Capillary: 213 mg/dL — ABNORMAL HIGH (ref 65–99)

## 2016-11-07 MED ORDER — HYDROMORPHONE HCL 2 MG PO TABS
2.0000 mg | ORAL_TABLET | ORAL | Status: DC | PRN
  Administered 2016-11-07 (×2): 4 mg via ORAL
  Administered 2016-11-07: 11:00:00 2 mg via ORAL
  Administered 2016-11-07 – 2016-11-08 (×6): 4 mg via ORAL
  Filled 2016-11-07 (×3): qty 2
  Filled 2016-11-07: qty 1
  Filled 2016-11-07 (×5): qty 2

## 2016-11-07 MED ORDER — HYDROMORPHONE HCL 1 MG/ML IJ SOLN: 0.5000 mg | INTRAMUSCULAR | Status: DC | PRN

## 2016-11-07 NOTE — Progress Notes (Signed)
   Subjective: 1 Day Post-Op Procedure(s) (LRB): LEFT TOTAL KNEE ARTHROPLASTY (Left) Patient reports pain as mild.   Patient seen in rounds for Dr. Wynelle Link. Patient is well, but has had some minor complaints of pain in the knee, requiring pain medications but had some itching so changed the oral pain medications We will start therapy today.  Plan is to go Home after hospital stay.  Objective: Vital signs in last 24 hours: Temp:  [97.4 F (36.3 C)-98.8 F (37.1 C)] 97.7 F (36.5 C) (12/19 2140) Pulse Rate:  [55-83] 60 (12/19 2140) Resp:  [15-16] 16 (12/19 2140) BP: (104-131)/(60-69) 120/61 (12/19 2140) SpO2:  [91 %-98 %] 96 % (12/19 2140)  Intake/Output from previous day:  Intake/Output Summary (Last 24 hours) at 11/07/16 2248 Last data filed at 11/07/16 1845  Gross per 24 hour  Intake             2380 ml  Output             2250 ml  Net              130 ml    Intake/Output this shift: No intake/output data recorded.  Labs:  Recent Labs  11/07/16 0406  HGB 13.9    Recent Labs  11/07/16 0406  WBC 9.9  RBC 5.50  HCT 43.4  PLT 188    Recent Labs  11/07/16 0406  NA 136  K 3.9  CL 103  CO2 26  BUN 21*  CREATININE 1.43*  GLUCOSE 192*  CALCIUM 8.7*   No results for input(s): LABPT, INR in the last 72 hours.  EXAM General - Patient is Alert, Appropriate and Oriented Extremity - Neurovascular intact Sensation intact distally Intact pulses distally Dorsiflexion/Plantar flexion intact Dressing - dressing C/D/I Motor Function - intact, moving foot and toes well on exam.  Hemovac pulled without difficulty.  Past Medical History:  Diagnosis Date  . AF (atrial fibrillation) ([place])   . Allergic rhinitis, cause unspecified   . Arthritis    OA  . Asthma   . Chronic kidney disease    AT 36 PERCENMT MANAGED BY DR SHAW  . Hypertension   . Hypothyroidism   . OSA on CPAP   . PONV (postoperative nausea and vomiting)    WITH GENERAL PREFERS SPINAL  . 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 Sore on leg    RIGHT LOWEER LEG HEALING X 3 WEEKS  . Type II or unspecified type diabetes mellitus without mention of complication, not stated as uncontrolled    TYPE 2  . Vertigo    LAST YEAR     Assessment/Plan: 1 Day Post-Op Procedure(s) (LRB): LEFT TOTAL KNEE ARTHROPLASTY (Left) Principal Problem:   OA (osteoarthritis) of knee  Estimated body mass index is 33.37 kg/m as calculated from the following:   Height as of this encounter: 5\' 9"  (1.753 m).   Weight as of this encounter: 102.5 kg (226 lb). Advance diet Up with therapy Plan for discharge tomorrow  DVT Prophylaxis - eliquis Weight-Bearing as tolerated to left leg D/C O2 and Pulse OX and try on Room Air  Arlee Muslim, PA-C Orthopaedic Surgery 11/07/2016, 10:48 PM

## 2016-11-07 NOTE — Care Management Note (Signed)
Case Management Note  Patient Details  Name: CORBAN KISTLER MRN: [id number] Date of Birth: 07-17-1941  Subjective/Objective:                  LEFT TOTAL KNEE ARTHROPLASTY (Left) Action/Plan: Discharge planning Expected Discharge Date:  11/07/16              Expected Discharge Plan:  Home/Self Care  In-House Referral:     Discharge planning Services  CM Consult  Post Acute Care Choice:  NA Choice offered to:  NA  DME Arranged:  N/A DME Agency:  NA  HH Arranged:  NA HH Agency:  NA  Status of Service:  Completed, signed off  If discussed at [place] of Stay Meetings, dates discussed:    Additional Comments: CM met with pt in room to confirm plan is for outpt PT; pt confirms.  Pt states he has all DME needed at home.  No other CM needs were communicated. Dellie Catholic, RN 11/07/2016, 3:21 PM

## 2016-11-07 NOTE — Evaluation (Signed)
Physical Therapy Evaluation Patient Details Name: Colton Brooks MRN: [id number] DOB: [birth year] Today's Date: 11/07/2016   History of Present Illness  s/p TKA  Clinical Impression  Pt admitted with above diagnosis. Pt currently with functional limitations due to the deficits listed below (see PT Problem List).  Pt will benefit from skilled PT to increase their independence and safety with mobility to allow discharge to the venue listed below.  Pt should do well, recommend HHPT     Follow Up Recommendations Home health PT    Equipment Recommendations  None recommended by PT    Recommendations for Other Services       Precautions / Restrictions Precautions Precautions: Fall;Knee Required Braces or Orthoses: Knee Immobilizer - Left Restrictions Weight Bearing Restrictions: No Other Position/Activity Restrictions: WBAT      Mobility  Bed Mobility Overal bed mobility: Needs Assistance Bed Mobility: Supine to Sit     Supine to sit: Min guard     General bed mobility comments: for safety  Transfers Overall transfer level: Needs assistance Equipment used: Rolling walker (2 wheeled) Transfers: Sit to/from Stand Sit to Stand: Min assist;Min guard         General transfer comment: light assist to rise and stabilize  Ambulation/Gait Ambulation/Gait assistance: Min guard Ambulation Distance (Feet): 75 Feet Assistive device: Rolling walker (2 wheeled) Gait Pattern/deviations: Step-to pattern;Step-through pattern     General Gait Details: cues for RW position and safety  Stairs            Wheelchair Mobility    Modified Rankin (Stroke Patients Only)       Balance                                             Pertinent Vitals/Pain Pain Assessment: 0-10 Pain Score: 2  Pain Location: L knee Pain Descriptors / Indicators: Sore Pain Intervention(s): Limited activity within patient's tolerance;Monitored during session;Premedicated  before session;Ice applied    Home Living Family/patient expects to be discharged to:: Private residence Living Arrangements: Spouse/significant other Available Help at Discharge: Family Type of Home: House Home Access: Stairs to enter   Technical brewer of Steps: 3 Home Layout: One level Home Equipment: Walker - 2 wheels;Crutches;Bedside commode;Shower seat;Hand held shower head;Shower seat - built in      Prior Function Level of Independence: Independent      ADL's / Homemaking Assistance Needed: pt states that he had some difficulty with LB ADLs prior to surgery due to pain        Hand Dominance   Dominant Hand: Right    Extremity/Trunk Assessment   Upper Extremity Assessment Upper Extremity Assessment: Defer to OT evaluation;Overall [facility] for tasks assessed    Lower Extremity Assessment Lower Extremity Assessment: LLE deficits/detail LLE Deficits / Details: ankle WFL; knee extension and hip flexion 2/5 with anticipated post op pain and weakness       Communication   Communication: No difficulties  Cognition Arousal/Alertness: Awake/alert Behavior During Therapy: WFL for tasks assessed/performed Overall Cognitive Status: Within Functional Limits for tasks assessed                      General Comments      Exercises Total Joint Exercises Ankle Circles/Pumps: AROM;Both;10 reps Quad Sets: AROM;10 reps;Both Heel Slides: AAROM;Left;5 reps   Assessment/Plan    PT Assessment Patient needs continued PT  services  PT Problem List Decreased strength;Decreased range of motion;Decreased activity tolerance;Decreased mobility;Decreased knowledge of use of DME;Pain          PT Treatment Interventions DME instruction;Gait training;Functional mobility training;Therapeutic activities;Therapeutic exercise;Patient/family education    PT Goals (Current goals can be found in the Care Plan section)  Acute Rehab PT Goals Patient Stated Goal: go home PT Goal  Formulation: With patient Time For Goal Achievement: 11/14/16 Potential to Achieve Goals: Good    Frequency Min 3X/week   Barriers to discharge        Co-evaluation               End of Session Equipment Utilized During Treatment: Gait belt Activity Tolerance: Patient tolerated treatment well Patient left: in chair;with call bell/phone within reach;with chair alarm set           Time: 0902-0925 PT Time Calculation (min) (ACUTE ONLY): 23 min   Charges:   PT Evaluation $PT Eval Low Complexity: 1 Procedure PT Treatments $Gait Training: 8-22 mins   PT G Codes:        Colton Brooks 11-14-2016, 12:44 PM

## 2016-11-07 NOTE — Progress Notes (Signed)
Physical Therapy Treatment Patient Details Name: Colton Brooks MRN: [id number] DOB: 1941/11/05 Today's Date: 11/07/2016    History of Present Illness s/p TKA    PT Comments    Pt progressing well, plans to D/C in am; will practice stairs prior to D/C  Follow Up Recommendations  Home health PT     Equipment Recommendations  None recommended by PT    Recommendations for Other Services       Precautions / Restrictions Precautions Precautions: Fall;Knee Precaution Comments: able to do IND SLRs end of session today Required Braces or Orthoses: Knee Immobilizer - Left Restrictions Weight Bearing Restrictions: No Other Position/Activity Restrictions: WBAT    Mobility  Bed Mobility Overal bed mobility: Needs Assistance Bed Mobility: Sit to Supine     Supine to sit: Min guard Sit to supine: Min guard   General bed mobility comments: light assist with LLE  Transfers Overall transfer level: Needs assistance Equipment used: Rolling walker (2 wheeled) Transfers: Sit to/from Stand Sit to Stand: Min guard;Supervision         General transfer comment: cues for safety and hand placement  Ambulation/Gait Ambulation/Gait assistance: Min guard Ambulation Distance (Feet): 200 Feet Assistive device: Rolling walker (2 wheeled) Gait Pattern/deviations: Step-to pattern;Step-through pattern     General Gait Details: cues for RW position and safety   Stairs            Wheelchair Mobility    Modified Rankin (Stroke Patients Only)       Balance Overall balance assessment: No apparent balance deficits (not formally assessed)                                  Cognition Arousal/Alertness: Awake/alert Behavior During Therapy: WFL for tasks assessed/performed Overall Cognitive Status: Within Functional Limits for tasks assessed                      Exercises Total Joint Exercises Ankle Circles/Pumps: AROM;Both;10 reps Quad Sets:  AROM;10 reps;Both Heel Slides: AAROM;Left;10 reps Hip ABduction/ADduction: AROM;AAROM;Left;15 reps Straight Leg Raises: AAROM;AROM;Strengthening;Left;20 reps    General Comments        Pertinent Vitals/Pain Pain Assessment: 0-10 Pain Score: 4  Pain Location: L knee Pain Descriptors / Indicators: Sore Pain Intervention(s): Limited activity within patient's tolerance;Monitored during session;Repositioned;Ice applied    Home Living Family/patient expects to be discharged to:: Private residence Living Arrangements: Spouse/significant other Available Help at Discharge: Family Type of Home: House Home Access: Stairs to enter   Home Layout: One level Home Equipment: Environmental consultant - 2 wheels;Crutches;Bedside commode;Shower seat;Hand held shower head;Shower seat - built in      Prior Function Level of Independence: Independent    ADL's / Homemaking Assistance Needed: pt states that he had some difficulty with LB ADLs prior to surgery due to pain     PT Goals (current goals can now be found in the care plan section) Acute Rehab PT Goals Patient Stated Goal: go home PT Goal Formulation: With patient Time For Goal Achievement: 11/14/16 Potential to Achieve Goals: Good Progress towards PT goals: Progressing toward goals    Frequency    Min 3X/week      PT Plan Current plan remains appropriate    Co-evaluation             End of Session Equipment Utilized During Treatment: Gait belt Activity Tolerance: Patient tolerated treatment well Patient left: in bed;with call bell/phone  within reach;with family/visitor present     Time: 1325-1350 PT Time Calculation (min) (ACUTE ONLY): 25 min  Charges:  $Gait Training: 8-22 mins $Therapeutic Exercise: 8-22 mins                    G Codes:      Shantel Wesely 2016-11-29, 2:45 PM

## 2016-11-07 NOTE — Evaluation (Signed)
Occupational Therapy Evaluation Patient Details Name: Colton Brooks MRN: [id number] DOB: 1940/12/24 Today's Date: 11/07/2016    History of Present Illness s/p TKA   Clinical Impression   Pt is at mod - min A level with LB ADLs, min guard A with ADL mobility. Pt will have 24 hour sup/assist from his wife at home. 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 All education completed and no further acute OT indicated at this time    Follow Up Recommendations  No OT follow up;Supervision - Intermittent    Equipment Recommendations  None recommended by OT    Recommendations for Other Services       Precautions / Restrictions Precautions Precautions: Fall;Knee Required Braces or Orthoses: Knee Immobilizer - Left Restrictions Weight Bearing Restrictions: No Other Position/Activity Restrictions: WBAT      Mobility Bed Mobility Overal bed mobility: Needs Assistance Bed Mobility: Supine to Sit     Supine to sit: Min guard     General bed mobility comments: pt up in recliner  Transfers Overall transfer level: Needs assistance Equipment used: Rolling walker (2 wheeled) Transfers: Sit to/from Stand Sit to Stand: Min guard         General transfer comment: light assist to rise and stabilize    Balance Overall balance assessment: No apparent balance deficits (not formally assessed)                                          ADL Overall ADL's : Needs assistance/impaired     Grooming: Wash/dry hands;Wash/dry face;Supervision/safety;Set up   Upper Body Bathing: Set up   Lower Body Bathing: Minimal assistance   Upper Body Dressing : Set up   Lower Body Dressing: Minimal assistance;Moderate assistance   Toilet Transfer: Min guard;RW;Ambulation   Toileting- Water quality scientist and Hygiene: Min guard   Tub/ Shower Transfer: 3 in 1;Min guard;Rolling walker;Ambulation     General ADL Comments: Pt with hx of R TKA and is familair with and has DME and A/E at  home. Pt will have 24 hour sup/assist from his wife at home. Pt interested  in compression hose donning device. OT educated pt on this device' pt ordered one that is recommended from [facility] at end of session      Vision Vision Assessment?: No apparent visual deficits              Pertinent Vitals/Pain Pain Assessment: 0-10 Pain Score: 5  Pain Location: L knee Pain Descriptors / Indicators: Sore Pain Intervention(s): Limited activity within patient's tolerance;Monitored during session;Premedicated before session;Ice applied     Hand Dominance Right   Extremity/Trunk Assessment Upper Extremity Assessment Upper Extremity Assessment: Overall WFL for tasks assessed   Lower Extremity Assessment Lower Extremity Assessment: Defer to PT evaluation LLE Deficits / Details: ankle WFL; knee extension and hip flexion 2/5 with anticipated post op pain and weakness       Communication Communication Communication: No difficulties   Cognition Arousal/Alertness: Awake/alert Behavior During Therapy: WFL for tasks assessed/performed Overall Cognitive Status: Within Functional Limits for tasks assessed                     General Comments   pt very pleasant and cooperative                 Home Living Family/patient expects to be discharged to:: Private residence Living Arrangements: Spouse/significant other  Available Help at Discharge: Family Type of Home: House Home Access: Stairs to enter [employer] of Steps: 3   Home Layout: One level     Bathroom Shower/Tub: Occupational psychologist: Handicapped height     Home Equipment: Environmental consultant - 2 wheels;Crutches;Bedside commode;Shower seat;Hand held shower head;Shower seat - built in          Prior Functioning/Environment Level of Independence: Independent    ADL's / Homemaking Assistance Needed: pt states that he had some difficulty with LB ADLs prior to surgery due to pain            OT  Problem List: Decreased activity tolerance;Pain   OT Treatment/Interventions:      OT Goals(Current goals can be found in the care plan section) Acute Rehab OT Goals Patient Stated Goal: go home OT Goal Formulation: With patient  OT Frequency:     Barriers to D/C:    no barriers                     End of Session Equipment Utilized During Treatment: Rolling walker;Other (comment) (3 in 1) CPM Left Knee CPM Left Knee: Off  Activity Tolerance: Patient tolerated treatment well Patient left: in chair;with call bell/phone within reach;with nursing/sitter in room   Time: 1020-1038 OT Time Calculation (min): 18 min Charges:  OT General Charges $OT Visit: 1 Procedure OT Evaluation $OT Eval Moderate Complexity: 1 Procedure G-Codes:    [name] 11/07/2016, 1:37 PM

## 2016-11-08 LAB — BASIC METABOLIC PANEL
Anion gap: 7 (ref 5–15)
BUN: 24 mg/dL — ABNORMAL HIGH (ref 6–20)
CHLORIDE: 104 mmol/L (ref 101–111)
CO2: 27 mmol/L (ref 22–32)
CREATININE: 1.57 mg/dL — AB (ref 0.61–1.24)
Calcium: 8.9 mg/dL (ref 8.9–10.3)
GFR, EST AFRICAN AMERICAN: 47 mL/min — AB (ref >60)
GFR, EST NON AFRICAN AMERICAN: 41 mL/min — AB (ref >60)
Glucose, Bld: 141 mg/dL — ABNORMAL HIGH (ref 65–99)
POTASSIUM: 3.6 mmol/L (ref 3.5–5.1)
SODIUM: 138 mmol/L (ref 135–145)

## 2016-11-08 LAB — GLUCOSE, CAPILLARY
GLUCOSE-CAPILLARY: 131 mg/dL — AB (ref 65–99)
GLUCOSE-CAPILLARY: 150 mg/dL — AB (ref 65–99)

## 2016-11-08 LAB — CBC
HCT: 41.4 % (ref 39.0–52.0)
HEMOGLOBIN: 13.7 g/dL (ref 13.0–17.0)
MCH: 25.9 pg — ABNORMAL LOW (ref 26.0–34.0)
MCHC: 33.1 g/dL (ref 30.0–36.0)
MCV: 78.3 fL (ref 78.0–100.0)
PLATELETS: 195 10*3/uL (ref 150–400)
RBC: 5.29 MIL/uL (ref 4.22–5.81)
RDW: 17.8 % — ABNORMAL HIGH (ref 11.5–15.5)
WBC: 12.6 10*3/uL — ABNORMAL HIGH (ref 4.0–10.5)

## 2016-11-08 MED ORDER — TRAMADOL HCL 50 MG PO TABS: 50.0000 mg | ORAL_TABLET | Freq: Four times a day (QID) | ORAL | 1 refills | Status: DC | PRN

## 2016-11-08 MED ORDER — PROMETHAZINE HCL 12.5 MG PO TABS: 12.5000 mg | ORAL_TABLET | Freq: Four times a day (QID) | ORAL | 0 refills | Status: DC | PRN

## 2016-11-08 MED ORDER — METAXALONE 800 MG PO TABS: 800.0000 mg | ORAL_TABLET | Freq: Three times a day (TID) | ORAL | 0 refills | Status: DC | PRN

## 2016-11-08 MED ORDER — HYDROMORPHONE HCL 2 MG PO TABS: 2.0000 mg | ORAL_TABLET | ORAL | 0 refills | Status: DC | PRN

## 2016-11-08 NOTE — Discharge Instructions (Signed)
° °Dr. Frank Aluisio °Total Joint Specialist °[facility] °[address] °[place], [place] [address] °[phone] ° °TOTAL KNEE REPLACEMENT POSTOPERATIVE DIRECTIONS ° °Knee Rehabilitation, Guidelines Following Surgery  °Results after knee surgery are often greatly improved when you follow the exercise, range of motion and muscle strengthening exercises prescribed by your doctor. Safety measures are also important to protect the knee from further injury. Any time any of these exercises cause you to have increased pain or swelling in your knee joint, decrease the amount until you are comfortable again and slowly increase them. If you have problems or questions, call your caregiver or physical therapist for advice.  ° °HOME CARE INSTRUCTIONS  °Remove items at home which could result in a fall. This includes throw rugs or furniture in walking pathways.  °· ICE to the affected knee every three hours for 30 minutes at a time and then as needed for pain and swelling.  Continue to use ice on the knee for pain and swelling from surgery. You may notice swelling that will progress down to the foot and ankle.  This is normal after surgery.  Elevate the leg when you are not up walking on it.   °· Continue to use the breathing machine which will help keep your temperature down.  It is common for your temperature to cycle up and down following surgery, especially at night when you are not up moving around and exerting yourself.  The breathing machine keeps your lungs expanded and your temperature down. °· Do not place pillow under knee, focus on keeping the knee straight while resting ° °DIET °You may resume your previous home diet once your are discharged from the hospital. ° °DRESSING / WOUND CARE / SHOWERING °You may shower 3 days after surgery, but keep the wounds dry during showering.  You may use an occlusive plastic wrap (Press'n Seal for example), NO SOAKING/SUBMERGING IN THE BATHTUB.  If the  bandage gets wet, change with a clean dry gauze.  If the incision gets wet, pat the wound dry with a clean towel. °You may start showering once you are discharged home but do not submerge the incision under water. Just pat the incision dry and apply a dry gauze dressing on daily. °Change the surgical dressing daily and reapply a dry dressing each time. ° °ACTIVITY °Walk with your walker as instructed. °Use walker as long as suggested by your caregivers. °Avoid periods of inactivity such as sitting longer than an hour when not asleep. This helps prevent blood clots.  °You may resume a sexual relationship in one month or when given the OK by your doctor.  °You may return to work once you are cleared by your doctor.  °Do not drive a car for 6 weeks or until released by you surgeon.  °Do not drive while taking narcotics. ° °WEIGHT BEARING °Weight bearing as tolerated with assist device (walker, cane, etc) as directed, use it as long as suggested by your surgeon or therapist, typically at least 4-6 weeks. ° °POSTOPERATIVE CONSTIPATION PROTOCOL °Constipation - defined medically as fewer than three stools per week and severe constipation as less than one stool per week. ° °One of the most common issues patients have following surgery is constipation.  Even if you have a regular bowel pattern at home, your normal regimen is likely to be disrupted due to multiple reasons following surgery.  Combination of anesthesia, postoperative narcotics, change in appetite and fluid intake all can affect your bowels.    In order to avoid complications following surgery, here are some recommendations in order to help you during your recovery period. ° °Colace (docusate) - Pick up an over-the-counter form of Colace or another stool softener and take twice a day as long as you are requiring postoperative pain medications.  Take with a full glass of water daily.  If you experience loose stools or diarrhea, hold the colace until you stool forms  back up.  If your symptoms do not get better within 1 week or if they get worse, check with your doctor. ° °Dulcolax (bisacodyl) - Pick up over-the-counter and take as directed by the product packaging as needed to assist with the movement of your bowels.  Take with a full glass of water.  Use this product as needed if not relieved by Colace only.  ° °MiraLax (polyethylene glycol) - Pick up over-the-counter to have on hand.  MiraLax is a solution that will increase the amount of water in your bowels to assist with bowel movements.  Take as directed and can mix with a glass of water, juice, soda, coffee, or tea.  Take if you go more than two days without a movement. °Do not use MiraLax more than once per day. Call your doctor if you are still constipated or irregular after using this medication for 7 days in a row. ° °If you continue to have problems with postoperative constipation, please contact the office for further assistance and recommendations.  If you experience "the worst abdominal pain ever" or develop nausea or vomiting, please contact the office immediatly for further recommendations for treatment. ° °ITCHING ° If you experience itching with your medications, try taking only a single pain pill, or even half a pain pill at a time.  You can also use Benadryl over the counter for itching or also to help with sleep.  ° °TED HOSE STOCKINGS °Wear the elastic stockings on both legs for three weeks following surgery during the day but you may remove then at night for sleeping. ° °MEDICATIONS °See your medication summary on the “After Visit Summary” that the nursing staff will review with you prior to discharge.  You may have some home medications which will be placed on hold until you complete the course of blood thinner medication.  It is important for you to complete the blood thinner medication as prescribed by your surgeon.  Continue your approved medications as instructed at time of  discharge. ° °PRECAUTIONS °If you experience chest pain or shortness of breath - call 911 immediately for transfer to the hospital emergency department.  °If you develop a fever greater that 101 F, purulent drainage from wound, increased redness or drainage from wound, foul odor from the wound/dressing, or calf pain - CONTACT YOUR SURGEON.   °                                                °FOLLOW-UP APPOINTMENTS °Make sure you keep all of your appointments after your operation with your surgeon and caregivers. You should call the office at the above phone number and make an appointment for approximately two weeks after the date of your surgery or on the date instructed by your surgeon outlined in the "After Visit Summary". ° ° °RANGE OF MOTION AND STRENGTHENING EXERCISES  °Rehabilitation of the knee is important following a knee injury or   an operation. After just a few days of immobilization, the muscles of the thigh which control the knee become weakened and shrink (atrophy). Knee exercises are designed to build up the tone and strength of the thigh muscles and to improve knee motion. Often times heat used for twenty to thirty minutes before working out will loosen up your tissues and help with improving the range of motion but do not use heat for the first two weeks following surgery. These exercises can be done on a training (exercise) mat, on the floor, on a table or on a bed. Use what ever works the best and is most comfortable for you Knee exercises include:  Leg Lifts - While your knee is still immobilized in a splint or cast, you can do straight leg raises. Lift the leg to 60 degrees, hold for 3 sec, and slowly lower the leg. Repeat 10-20 times 2-3 times daily. Perform this exercise against resistance later as your knee gets better.  Quad and Hamstring Sets - Tighten up the muscle on the front of the thigh (Quad) and hold for 5-10 sec. Repeat this 10-20 times hourly. Hamstring sets are done by pushing the  foot backward against an object and holding for 5-10 sec. Repeat as with quad sets.   Leg Slides: Lying on your back, slowly slide your foot toward your buttocks, bending your knee up off the floor (only go as far as is comfortable). Then slowly slide your foot back down until your leg is flat on the floor again.  Angel Wings: Lying on your back spread your legs to the side as far apart as you can without causing discomfort.  A rehabilitation program following serious knee injuries can speed recovery and prevent re-injury in the future due to weakened muscles. Contact your doctor or a physical therapist for more information on knee rehabilitation.   IF YOU ARE TRANSFERRED TO A SKILLED REHAB FACILITY If the patient is transferred to a skilled rehab facility following release from the hospital, a list of the current medications will be sent to the facility for the patient to continue.  When discharged from the skilled rehab facility, please have the facility set up the patient's [place] prior to being released. Also, the skilled facility will be responsible for providing the patient with their medications at time of release from the facility to include their pain medication, the muscle relaxants, and their blood thinner medication. If the patient is still at the rehab facility at time of the two week follow up appointment, the skilled rehab facility will also need to assist the patient in arranging follow up appointment in our office and any transportation needs.  MAKE SURE YOU:  Understand these instructions.  Get help right away if you are not doing well or get worse.    Pick up stool softner and laxative for home use following surgery while on pain medications. Do not submerge incision under water. Please use good hand washing techniques while changing dressing each day. May shower starting three days after surgery. Please use a clean towel to pat the incision dry following  showers. Continue to use ice for pain and swelling after surgery. Do not use any lotions or creams on the incision until instructed by your surgeon.  May resume the Eliquis 5 mg twice a day starting tonight at home on 11/08/2016.

## 2016-11-08 NOTE — Discharge Summary (Signed)
Physician Discharge Summary   Patient ID: Colton Brooks MRN: [id number] DOB/AGE: 1941-10-07 75 y.o.  Admit date: 11/06/2016 Discharge date: 11/08/2016  Primary Diagnosis:  Osteoarthritis  Left knee(s)  Admission Diagnoses:  Past Medical History:  Diagnosis Date  . AF (atrial fibrillation) ([place])   . Allergic rhinitis, cause unspecified   . Arthritis    OA  . Asthma   . Chronic kidney disease    AT 77 PERCENMT MANAGED BY DR SHAW  . Hypertension   . Hypothyroidism   . OSA on CPAP   . PONV (postoperative nausea and vomiting)    WITH GENERAL PREFERS SPINAL  . 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 Sore on leg    RIGHT LOWEER LEG HEALING X 3 WEEKS  . Type II or unspecified type diabetes mellitus without mention of complication, not stated as uncontrolled    TYPE 2  . Vertigo    LAST YEAR    Discharge Diagnoses:   Principal Problem:   OA (osteoarthritis) of knee  Estimated body mass index is 33.37 kg/m as calculated from the following:   Height as of this encounter: 5' 9" (1.753 m).   Weight as of this encounter: 102.5 kg (226 lb).  Procedure:  Procedure(s) (LRB): LEFT TOTAL KNEE ARTHROPLASTY (Left)   Consults: None  HPI: Colton Brooks is a 75 y.o. year old male with end stage OA of his left knee with progressively worsening pain and dysfunction. He has constant pain, with activity and at rest and significant functional deficits with difficulties even with ADLs. He has had extensive non-op management including analgesics, injections of cortisone, and home exercise program, but remains in significant pain with significant dysfunction. Radiographs show bone on bone arthritis medial and patellofemoral. He presents now for left Total Knee Arthroplasty.  Laboratory Data: Admission on 11/06/2016  Component Date Value Ref Range Status  . Glucose-Capillary 11/06/2016 100* 65 - 99 mg/dL Final  . Glucose-Capillary 11/06/2016 104* 65  - 99 mg/dL Final  . WBC 11/07/2016 9.9  4.0 - 10.5 K/uL Final  . RBC 11/07/2016 5.50  4.22 - 5.81 MIL/uL Final  . Hemoglobin 11/07/2016 13.9  13.0 - 17.0 g/dL Final  . HCT 11/07/2016 43.4  39.0 - 52.0 % Final  . MCV 11/07/2016 78.9  78.0 - 100.0 fL Final  . MCH 11/07/2016 25.3* 26.0 - 34.0 pg Final  . MCHC 11/07/2016 32.0  30.0 - 36.0 g/dL Final  . RDW 11/07/2016 17.8* 11.5 - 15.5 % Final  . Platelets 11/07/2016 188  150 - 400 K/uL Final  . Sodium 11/07/2016 136  135 - 145 mmol/L Final  . Potassium 11/07/2016 3.9  3.5 - 5.1 mmol/L Final  . Chloride 11/07/2016 103  101 - 111 mmol/L Final  . CO2 11/07/2016 26  22 - 32 mmol/L Final  . Glucose, Bld 11/07/2016 192* 65 - 99 mg/dL Final  . BUN 11/07/2016 21* 6 - 20 mg/dL Final  . Creatinine, Ser 11/07/2016 1.43* 0.61 - 1.24 mg/dL Final  . Calcium 11/07/2016 8.7* 8.9 - 10.3 mg/dL Final  . GFR calc non Af Amer 11/07/2016 46* >60 mL/min Final  . GFR calc Af Amer 11/07/2016 53* >60 mL/min Final   Comment: (NOTE) The eGFR has been calculated using the CKD EPI equation. This calculation has not been validated in all clinical situations. eGFR's persistently <60 mL/min signify possible Chronic Kidney Disease.   Georgiann Hahn gap 11/07/2016  7  5 - 15 Final  . Glucose-Capillary 11/06/2016 157* 65 - 99 mg/dL Final  . Glucose-Capillary 11/07/2016 176* 65 - 99 mg/dL Final  . Glucose-Capillary 11/07/2016 213* 65 - 99 mg/dL Final  . WBC 11/08/2016 12.6* 4.0 - 10.5 K/uL Final  . RBC 11/08/2016 5.29  4.22 - 5.81 MIL/uL Final  . Hemoglobin 11/08/2016 13.7  13.0 - 17.0 g/dL Final  . HCT 11/08/2016 41.4  39.0 - 52.0 % Final  . MCV 11/08/2016 78.3  78.0 - 100.0 fL Final  . MCH 11/08/2016 25.9* 26.0 - 34.0 pg Final  . MCHC 11/08/2016 33.1  30.0 - 36.0 g/dL Final  . RDW 11/08/2016 17.8* 11.5 - 15.5 % Final  . Platelets 11/08/2016 195  150 - 400 K/uL Final  . Sodium 11/08/2016 138  135 - 145 mmol/L Final  . Potassium 11/08/2016 3.6  3.5 - 5.1 mmol/L Final  .  Chloride 11/08/2016 104  101 - 111 mmol/L Final  . CO2 11/08/2016 27  22 - 32 mmol/L Final  . Glucose, Bld 11/08/2016 141* 65 - 99 mg/dL Final  . BUN 11/08/2016 24* 6 - 20 mg/dL Final  . Creatinine, Ser 11/08/2016 1.57* 0.61 - 1.24 mg/dL Final  . Calcium 11/08/2016 8.9  8.9 - 10.3 mg/dL Final  . GFR calc non Af Amer 11/08/2016 41* >60 mL/min Final  . GFR calc Af Amer 11/08/2016 47* >60 mL/min Final   Comment: (NOTE) The eGFR has been calculated using the CKD EPI equation. This calculation has not been validated in all clinical situations. eGFR's persistently <60 mL/min signify possible Chronic Kidney Disease.   . Anion gap 11/08/2016 7  5 - 15 Final  . Glucose-Capillary 11/07/2016 181* 65 - 99 mg/dL Final  . Glucose-Capillary 11/07/2016 172* 65 - 99 mg/dL Final  . Comment 1 11/07/2016 Notify RN   Final  . Glucose-Capillary 11/08/2016 131* 65 - 99 mg/dL Final  . Comment 1 11/08/2016 QC Due   Final  Hospital Outpatient Visit on 11/01/2016  Component Date Value Ref Range Status  . Hgb A1c MFr Bld 11/02/2016 6.6* 4.8 - 5.6 % Final   Comment: (NOTE)         Pre-diabetes: 5.7 - 6.4         Diabetes: >6.4         Glycemic control for adults with diabetes: <7.0   . Mean Plasma Glucose 11/02/2016 143  mg/dL Final   Comment: (NOTE) Performed At: [facility] [place], [place] [id number] Lindon Romp MD [id number]   . aPTT 11/01/2016 39* 24 - 36 seconds Final   Comment:        IF BASELINE aPTT IS ELEVATED, SUGGEST PATIENT RISK ASSESSMENT BE USED TO DETERMINE APPROPRIATE ANTICOAGULANT THERAPY.   . WBC 11/01/2016 8.1  4.0 - 10.5 K/uL Final  . RBC 11/01/2016 6.16* 4.22 - 5.81 MIL/uL Final  . Hemoglobin 11/01/2016 15.8  13.0 - 17.0 g/dL Final  . HCT 11/01/2016 49.1  39.0 - 52.0 % Final  . MCV 11/01/2016 79.7  78.0 - 100.0 fL Final  . MCH 11/01/2016 25.6* 26.0 - 34.0 pg Final  . MCHC 11/01/2016 32.2  30.0 - 36.0 g/dL Final  . RDW 11/01/2016 17.9* 11.5 -  15.5 % Final  . Platelets 11/01/2016 186  150 - 400 K/uL Final  . Sodium 11/01/2016 138  135 - 145 mmol/L Final  . Potassium 11/01/2016 3.4* 3.5 - 5.1 mmol/L Final  . Chloride 11/01/2016 104  101 -  111 mmol/L Final  . CO2 11/01/2016 26  22 - 32 mmol/L Final  . Glucose, Bld 11/01/2016 107* 65 - 99 mg/dL Final  . BUN 11/01/2016 20  6 - 20 mg/dL Final  . Creatinine, Ser 11/01/2016 1.30* 0.61 - 1.24 mg/dL Final  . Calcium 11/01/2016 9.5  8.9 - 10.3 mg/dL Final  . Total Protein 11/01/2016 7.3  6.5 - 8.1 g/dL Final  . Albumin 11/01/2016 4.2  3.5 - 5.0 g/dL Final  . AST 11/01/2016 21  15 - 41 U/L Final  . ALT 11/01/2016 16* 17 - 63 U/L Final  . Alkaline Phosphatase 11/01/2016 67  38 - 126 U/L Final  . Total Bilirubin 11/01/2016 0.8  0.3 - 1.2 mg/dL Final  . GFR calc non Af Amer 11/01/2016 51* >60 mL/min Final  . GFR calc Af Amer 11/01/2016 59* >60 mL/min Final   Comment: (NOTE) The eGFR has been calculated using the CKD EPI equation. This calculation has not been validated in all clinical situations. eGFR's persistently <60 mL/min signify possible Chronic Kidney Disease.   . Anion gap 11/01/2016 8  5 - 15 Final  . Prothrombin Time 11/01/2016 16.5* 11.4 - 15.2 seconds Final  . INR 11/01/2016 1.33   Final  . ABO/RH(D) 11/06/2016 B POS   Final  . Antibody Screen 11/06/2016 NEG   Final  . Sample Expiration 11/06/2016 11/09/2016   Final  . Extend sample reason 11/06/2016 NO TRANSFUSIONS OR PREGNANCY IN THE PAST 3 MONTHS   Final  . Color, Urine 11/01/2016 YELLOW  YELLOW Final  . APPearance 11/01/2016 CLEAR  CLEAR Final  . Specific Gravity, Urine 11/01/2016 1.013  1.005 - 1.030 Final  . pH 11/01/2016 5.0  5.0 - 8.0 Final  . Glucose, UA 11/01/2016 >=500* NEGATIVE mg/dL Final  . Hgb urine dipstick 11/01/2016 SMALL* NEGATIVE Final  . Bilirubin Urine 11/01/2016 NEGATIVE  NEGATIVE Final  . Ketones, ur 11/01/2016 NEGATIVE  NEGATIVE mg/dL Final  . Protein, ur 11/01/2016 NEGATIVE  NEGATIVE mg/dL  Final  . Nitrite 11/01/2016 NEGATIVE  NEGATIVE Final  . Leukocytes, UA 11/01/2016 SMALL* NEGATIVE Final  . RBC / HPF 11/01/2016 0-5  0 - 5 RBC/hpf Final  . WBC, UA 11/01/2016 0-5  0 - 5 WBC/hpf Final  . Bacteria, UA 11/01/2016 NONE SEEN  NONE SEEN Final  . Squamous Epithelial / LPF 11/01/2016 0-5* NONE SEEN Final  . Mucous 11/01/2016 PRESENT   Final  . MRSA, PCR 11/01/2016 NEGATIVE  NEGATIVE Final  . Staphylococcus aureus 11/01/2016 NEGATIVE  NEGATIVE Final   Comment:        The Xpert SA Assay (FDA approved for NASAL specimens in patients over 72 years of age), is one component of a comprehensive surveillance program.  Test performance has been validated by [facility] for patients greater than or equal to 85 year old. It is not intended to diagnose infection nor to guide or monitor treatment.   . Glucose-Capillary 11/01/2016 121* 65 - 99 mg/dL Final     X-Rays:No results found.  EKG: Orders placed or performed in visit on 09/14/16  . EKG 12-Lead     Hospital Course: Colton Brooks is a 75 y.o. who was admitted to [facility]. They were brought to the operating room on 11/06/2016 and underwent Procedure(s): LEFT TOTAL KNEE ARTHROPLASTY.  Patient tolerated the procedure well and was later transferred to the recovery room and then to the orthopaedic floor for postoperative care.  They were given PO and IV analgesics for  pain control following their surgery.  They were given 24 hours of postoperative antibiotics of  Anti-infectives    Start     Dose/Rate Route Frequency Ordered Stop   11/06/16 2200  ceFAZolin (ANCEF) IVPB 2g/100 mL premix     2 g 200 mL/hr over 30 Minutes Intravenous Every 6 hours 11/06/16 1810 11/07/16 0437   11/06/16 1152  ceFAZolin (ANCEF) IVPB 2g/100 mL premix     2 g 200 mL/hr over 30 Minutes Intravenous On call to O.R. 11/06/16 1152 11/06/16 1509     and started on DVT prophylaxis in the form of Eliquis.   PT and OT were ordered for total  joint protocol.  Discharge planning consulted to help with postop disposition and equipment needs.  Patient had a decent night on the evening of surgery.  They started to get up OOB with therapy on day one. Hemovac drain was pulled without difficulty.  Continued to work with therapy into day two.  Dressing was changed on day two and the incision was healing well.   Patient was seen in rounds and was ready to go home.  Plan for discharge  Diet - Cardiac diet and Diabetic diet Follow up - in 2 weeks Activity - WBAT Disposition - Home Condition Upon Discharge - Good D/C Meds - See DC Summary DVT Prophylaxis - Eliquis   Discharge Instructions    Call MD / Call 911    Complete by:  As directed    If you experience chest pain or shortness of breath, CALL 911 and be transported to the hospital emergency room.  If you develope a fever above 101 F, pus (white drainage) or increased drainage or redness at the wound, or calf pain, call your surgeon's office.   Change dressing    Complete by:  As directed    Change dressing daily with sterile 4 x 4 inch gauze dressing and apply TED hose. Do not submerge the incision under water.   Constipation Prevention    Complete by:  As directed    Drink plenty of fluids.  Prune juice may be helpful.  You may use a stool softener, such as Colace (over the counter) 100 mg twice a day.  Use MiraLax (over the counter) for constipation as needed.   Diet - low sodium heart healthy    Complete by:  As directed    Diet Carb Modified    Complete by:  As directed    Discharge instructions    Complete by:  As directed    Pick up stool softner and laxative for home use following surgery while on pain medications. Do not submerge incision under water. Please use good hand washing techniques while changing dressing each day. May shower starting three days after surgery. Please use a clean towel to pat the incision dry following showers. Continue to use ice for pain and  swelling after surgery. Do not use any lotions or creams on the incision until instructed by your surgeon.   Postoperative Constipation Protocol  Constipation - defined medically as fewer than three stools per week and severe constipation as less than one stool per week.  One of the most common issues patients have following surgery is constipation.  Even if you have a regular bowel pattern at home, your normal regimen is likely to be disrupted due to multiple reasons following surgery.  Combination of anesthesia, postoperative narcotics, change in appetite and fluid intake all can affect your bowels.  In order to  avoid complications following surgery, here are some recommendations in order to help you during your recovery period.  Colace (docusate) - Pick up an over-the-counter form of Colace or another stool softener and take twice a day as long as you are requiring postoperative pain medications.  Take with a full glass of water daily.  If you experience loose stools or diarrhea, hold the colace until you stool forms back up.  If your symptoms do not get better within 1 week or if they get worse, check with your doctor.  Dulcolax (bisacodyl) - Pick up over-the-counter and take as directed by the product packaging as needed to assist with the movement of your bowels.  Take with a full glass of water.  Use this product as needed if not relieved by Colace only.   MiraLax (polyethylene glycol) - Pick up over-the-counter to have on hand.  MiraLax is a solution that will increase the amount of water in your bowels to assist with bowel movements.  Take as directed and can mix with a glass of water, juice, soda, coffee, or tea.  Take if you go more than two days without a movement. Do not use MiraLax more than once per day. Call your doctor if you are still constipated or irregular after using this medication for 7 days in a row.  If you continue to have problems with postoperative constipation, please  contact the office for further assistance and recommendations.  If you experience "the worst abdominal pain ever" or develop nausea or vomiting, please contact the office immediatly for further recommendations for treatment.   May resume the Eliquis 5 mg twice a day starting tonight 11/08/2016   Do not put a pillow under the knee. Place it under the heel.    Complete by:  As directed    Do not sit on low chairs, stoools or toilet seats, as it may be difficult to get up from low surfaces    Complete by:  As directed    Driving restrictions    Complete by:  As directed    No driving until released by the physician.   Increase activity slowly as tolerated    Complete by:  As directed    Lifting restrictions    Complete by:  As directed    No lifting until released by the physician.   Patient may shower    Complete by:  As directed    You may shower without a dressing once there is no drainage.  Do not wash over the wound.  If drainage remains, do not shower until drainage stops.   TED hose    Complete by:  As directed    Use stockings (TED hose) for 3 weeks on both leg(s).  You may remove them at night for sleeping.   Weight bearing as tolerated    Complete by:  As directed    Laterality:  left   Extremity:  Lower     Allergies as of 11/08/2016   No Known Allergies     Medication List    TAKE these medications   amLODipine 5 MG tablet Commonly known as:  NORVASC Take 1 tablet (5 mg total) by mouth 2 (two) times daily.   ANDROGEL PUMP 20.25 MG/ACT (1.62%) Gel Generic drug:  Testosterone 4 pumps daily   atorvastatin 80 MG tablet Commonly known as:  LIPITOR Take 80 mg by mouth at bedtime.   azelastine 0.1 % nasal spray Commonly known as:  ASTELIN Place 1  spray into the nose daily as needed for rhinitis. Use in each nostril as directed   BEANO Tabs Take 1 tablet by mouth as needed (FOR GAS).   CLARITIN 10 MG Caps Generic drug:  Loratadine Take 1 capsule by mouth at  bedtime.   colchicine 0.6 MG tablet Take 0.6 mg by mouth daily as needed (GOUT).   CoQ10 100 MG Caps Take 100 mg by mouth daily.   CREON 24000-76000 units Cpep Generic drug:  Pancrelipase (Lip-Prot-Amyl) Take 24,000-48,000 Units by mouth 3 (three) times daily as needed (digestion).   desonide 0.05 % cream Commonly known as:  DESOWEN Apply 1 application topically daily as needed (SKIN IRRITATION).   ELIQUIS 5 MG Tabs tablet Generic drug:  apixaban Take 1 tablet by mouth 2 (two) times daily.   fluticasone 50 MCG/ACT nasal spray Commonly known as:  FLONASE Place 2 sprays into both nostrils daily. What changed:  when to take this  reasons to take this   fluticasone furoate-vilanterol 100-25 MCG/INH Aepb Commonly known as:  BREO ELLIPTA Inhale 1 puff into the lungs daily.   furosemide 20 MG tablet Commonly known as:  LASIX Take 20 mg by mouth daily.   GAS-X PO Take 180 mg by mouth as needed (gas).   hydrochlorothiazide 25 MG tablet Commonly known as:  HYDRODIURIL Take 25 mg by mouth daily.   HYDROCIL PO Take 30 grams in 8 oz of water daily   hydrocortisone 2.5 % rectal cream Commonly known as:  ANUSOL-HC Place 1 application rectally 2 (two) times daily as needed for hemorrhoids.   HYDROmorphone 2 MG tablet Commonly known as:  DILAUDID Take 1-2 tablets (2-4 mg total) by mouth every 3 (three) hours as needed for moderate pain or severe pain.   JARDIANCE 10 MG Tabs tablet Generic drug:  empagliflozin Take 10 mg by mouth daily.   LANTUS 100 UNIT/ML injection Generic drug:  insulin glargine Inject 20 Units into the skin at bedtime.   levothyroxine 50 MCG tablet Commonly known as:  SYNTHROID, LEVOTHROID Take 50 mcg by mouth daily.   losartan 50 MG tablet Commonly known as:  COZAAR Take 1 tablet (50 mg total) by mouth 2 (two) times daily.   LUMIGAN 0.01 % Soln Generic drug:  bimatoprost Place 1 drop into both eyes at bedtime.   metaxalone 800 MG  tablet Commonly known as:  SKELAXIN Take 1 tablet (800 mg total) by mouth 3 (three) times daily as needed for muscle spasms.   metFORMIN 500 MG tablet Commonly known as:  GLUCOPHAGE Take 1,000 mg by mouth 2 (two) times daily with a meal.   metoprolol succinate 100 MG 24 hr tablet Commonly known as:  TOPROL-XL Take 1 tablet (100 mg total) by mouth as directed. Take 150 mg in the AM and 100 mg in the PM   montelukast 10 MG tablet Commonly known as:  SINGULAIR Take 20 mg by mouth at bedtime.   Omega-3 Krill Oil 300 MG Caps Take 1 capsule by mouth daily.   pantoprazole 40 MG tablet Commonly known as:  PROTONIX Take 40 mg by mouth 2 (two) times daily.   potassium chloride SA 20 MEQ tablet Commonly known as:  K-DUR,KLOR-CON Take 20 mEq by mouth daily.   promethazine 12.5 MG tablet Commonly known as:  PHENERGAN Take 1-2 tablets (12.5-25 mg total) by mouth every 6 (six) hours as needed for nausea.   quiNINE 324 MG capsule Commonly known as:  QUALAQUIN Take 324 mg by mouth daily as  needed (muscle cramps).   Safflower Oil 1000 MG Caps Take 1 capsule by mouth as needed (GOUT).   Salicylic Acid-Cleanser 6 % (Lotion) Kit Apply topically and occlude area at night What changed:  additional instructions   traMADol 50 MG tablet Commonly known as:  ULTRAM Take 1-2 tablets (50-100 mg total) by mouth every 6 (six) hours as needed for moderate pain.   TRESIBA FLEXTOUCH Dana Inject 20 Units into the skin at bedtime.   VICTOZA 18 MG/3ML Sopn Generic drug:  liraglutide Inject 1.8 mg as directed at bedtime.      Follow-up Information    [name], MD. Schedule an appointment as soon as possible for a visit on 11/21/2016.   Specialty:  Orthopedic Surgery Contact information: [address] [place] [address] [phone]           Signed: Arlee Muslim, PA-C Orthopaedic Surgery 11/08/2016, 10:42 AM

## 2016-11-08 NOTE — Progress Notes (Signed)
   Subjective: 2 Days Post-Op Procedure(s) (LRB): LEFT TOTAL KNEE ARTHROPLASTY (Left) Patient reports pain as mild and moderate.   Patient seen in rounds for Dr. Wynelle Link. Patient is well, but has had some minor complaints of pain in the knee, requiring pain medications Patient is ready to go home later today  Objective: Vital signs in last 24 hours: Temp:  [97.7 F (36.5 C)-98.7 F (37.1 C)] 98 F (36.7 C) (12/20 0506) Pulse Rate:  [55-83] 63 (12/20 1020) Resp:  [16] 16 (12/20 0506) BP: (104-143)/(60-73) 143/73 (12/20 1020) SpO2:  [91 %-97 %] 97 % (12/20 0506)  Intake/Output from previous day:  Intake/Output Summary (Last 24 hours) at 11/08/16 1034 Last data filed at 11/08/16 0925  Gross per 24 hour  Intake           873.33 ml  Output              600 ml  Net           273.33 ml    Intake/Output this shift: Total I/O In: 360 [P.O.:360] Out: -   Labs:  Recent Labs  11/07/16 0406 11/08/16 0439  HGB 13.9 13.7    Recent Labs  11/07/16 0406 11/08/16 0439  WBC 9.9 12.6*  RBC 5.50 5.29  HCT 43.4 41.4  PLT 188 195    Recent Labs  11/07/16 0406 11/08/16 0439  NA 136 138  K 3.9 3.6  CL 103 104  CO2 26 27  BUN 21* 24*  CREATININE 1.43* 1.57*  GLUCOSE 192* 141*  CALCIUM 8.7* 8.9   No results for input(s): LABPT, INR in the last 72 hours.  EXAM: General - Patient is Alert, Appropriate and Oriented Extremity - Neurovascular intact Sensation intact distally Intact pulses distally Dorsiflexion/Plantar flexion intact Incision - clean, dry, no drainage Motor Function - intact, moving foot and toes well on exam.   Assessment/Plan: 2 Days Post-Op Procedure(s) (LRB): LEFT TOTAL KNEE ARTHROPLASTY (Left) Procedure(s) (LRB): LEFT TOTAL KNEE ARTHROPLASTY (Left) Past Medical History:  Diagnosis Date  . AF (atrial fibrillation) ([place])   . Allergic rhinitis, cause unspecified   . Arthritis    OA  . Asthma   . Chronic kidney disease    AT 60 PERCENMT  MANAGED BY DR SHAW  . Hypertension   . Hypothyroidism   . OSA on CPAP   . PONV (postoperative nausea and vomiting)    WITH GENERAL PREFERS SPINAL  . 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 Sore on leg    RIGHT LOWEER LEG HEALING X 3 WEEKS  . Type II or unspecified type diabetes mellitus without mention of complication, not stated as uncontrolled    TYPE 2  . Vertigo    LAST YEAR    Principal Problem:   OA (osteoarthritis) of knee  Estimated body mass index is 33.37 kg/m as calculated from the following:   Height as of this encounter: 5\' 9"  (1.753 m).   Weight as of this encounter: 102.5 kg (226 lb). Up with therapy Plan for discharge  Diet - Cardiac diet and Diabetic diet Follow up - in 2 weeks Activity - WBAT Disposition - Home Condition Upon Discharge - Good D/C Meds - See DC Summary DVT Prophylaxis - Eliquis  Arlee Muslim, PA-C Orthopaedic Surgery 11/08/2016, 10:34 AM

## 2016-11-08 NOTE — Progress Notes (Signed)
Physical Therapy Treatment Patient Details Name: Colton Brooks MRN: [id number] DOB: Jun 17, 1941 Today's Date: 11/08/2016    History of Present Illness s/p TKA    PT Comments    Assisted with amb a greater distance.  Pain more controlled.  Pt ready for D/C to home   Follow Up Recommendations   (per pt and spouse.......Marland Kitchenpt starting OP this Friday)     Equipment Recommendations  None recommended by PT    Recommendations for Other Services       Precautions / Restrictions Precautions Precautions: Fall;Knee Precaution Comments: unable to perform SLR today due to increased pain.  Instructed to wear esp for stairs.  had spouse practice applying and removing Required Braces or Orthoses: Knee Immobilizer - Left Restrictions Weight Bearing Restrictions: No Other Position/Activity Restrictions: WBAT    Mobility  Bed Mobility Overal bed mobility: Needs Assistance Bed Mobility: Supine to Sit     Supine to sit: Min guard     General bed mobility comments: Pt OOB in recliner  Transfers Overall transfer level: Needs assistance Equipment used: Rolling walker (2 wheeled) Transfers: Sit to/from Stand Sit to Stand: Min guard;Supervision         General transfer comment: cues for safety and hand placement with increased time   Ambulation/Gait Ambulation/Gait assistance: Min guard;Min assist Ambulation Distance (Feet): 85 Feet Assistive device: Rolling walker (2 wheeled) Gait Pattern/deviations: Step-to pattern;Step-through pattern;Decreased stance time - left Gait velocity: decreased   General Gait Details: Tolerated an increased distance.  Reminded to wear KI for increased support.     Stairs Stairs: Yes   Stair Management: No rails;Step to pattern;Backwards;With walker Number of Stairs: 4 General stair comments: with spouse present "hands on" training to assist pt with stair training.  Instructed to wear KI.  Unable to attempt stair training with crutches due to  pain level and balance.    Wheelchair Mobility    Modified Rankin (Stroke Patients Only)       Balance                                    Cognition Arousal/Alertness: Awake/alert Behavior During Therapy: WFL for tasks assessed/performed Overall Cognitive Status: Within Functional Limits for tasks assessed                      Exercises      General Comments        Pertinent Vitals/Pain Pain Assessment: 0-10 Pain Score: 6  Pain Location: L knee "much worse today" but better than this morning Pain Descriptors / Indicators: Operative site guarding;Sore;Tender Pain Intervention(s): Monitored during session;Repositioned;Ice applied    Home Living                      Prior Function            PT Goals (current goals can now be found in the care plan section) Progress towards PT goals: Progressing toward goals    Frequency    7X/week      PT Plan Frequency needs to be updated    Co-evaluation             End of Session Equipment Utilized During Treatment: Gait belt Activity Tolerance: Patient limited by pain Patient left: in chair;with call bell/phone within reach;with family/visitor present     Time: [id number] PT Time Calculation (min) (ACUTE ONLY): 24 min  Charges:  $  Gait Training: 8-22 mins $Therapeutic Activity: 8-22 mins                    G Codes:      Rica Koyanagi  PTA [facility]  Acute  Rehab Pager      [phone]

## 2016-11-08 NOTE — Consult Note (Signed)
   [facility] Inpatient Consult   11/08/2016  EDKER BERGSTEN 03-26-1941 [id number]     Patient screened for [facility] Care Management program. No identifiable [facility] Care Management needs noted at this time.    Marthenia Rolling, MSN-Ed, RN,BSN [facility] Liaison [phone]

## 2016-11-08 NOTE — Progress Notes (Signed)
Physical Therapy Treatment Patient Details Name: Colton Brooks MRN: [id number] DOB: 1941/02/14 Today's Date: 11/08/2016    History of Present Illness s/p TKA    PT Comments    POD @ am session Session limited by pain level and feeling "bad".  Pt will need another PT session prior to D/C today.   Performed stairs with spouse with some difficulty.    Follow Up Recommendations   (per pt and spouse.......Marland Kitchenpt starting OP this Friday)     Equipment Recommendations  None recommended by PT (from previous surgery)    Recommendations for Other Services       Precautions / Restrictions Precautions Precautions: Fall;Knee Precaution Comments: unable to perform SLR today due to increased pain.  Instructed to wear esp for stairs.  had spouse practice applying and removing Required Braces or Orthoses: Knee Immobilizer - Left Restrictions Weight Bearing Restrictions: No Other Position/Activity Restrictions: WBAT    Mobility  Bed Mobility Overal bed mobility: Needs Assistance Bed Mobility: Supine to Sit     Supine to sit: Min guard     General bed mobility comments: had spouse "hands on" assist pt OOB by supporting his L LE  Transfers Overall transfer level: Needs assistance Equipment used: Rolling walker (2 wheeled) Transfers: Sit to/from Stand Sit to Stand: Min guard;Supervision         General transfer comment: cues for safety and hand placement with increased time   Ambulation/Gait Ambulation/Gait assistance: Min guard;Min assist Ambulation Distance (Feet): 18 Feet Assistive device: Rolling walker (2 wheeled) Gait Pattern/deviations: Step-to pattern;Step-through pattern;Decreased stance time - left Gait velocity: decreased   General Gait Details: decreased distance due to increased c/o pain so that pt was nausea.  Cols wash cloth to face and diet GingerAle given with seated rest break.     Stairs Stairs: Yes   Stair Management: No rails;Step to  pattern;Backwards;With walker Number of Stairs: 4 General stair comments: with spouse present "hands on" training to assist pt with stair training.  Instructed to wear KI.  Unable to attempt stair training with crutches due to pain level and balance.    Wheelchair Mobility    Modified Rankin (Stroke Patients Only)       Balance                                    Cognition                            Exercises   Total Knee Replacement TE's 10 reps B LE ankle pumps 10 reps towel squeezes 10 reps knee presses  10 reps SLR's 10 reps ABD Followed by ICE     General Comments        Pertinent Vitals/Pain Pain Assessment: 0-10 Pain Score: 9  Pain Location: L knee "much worse today" Pain Descriptors / Indicators: Operative site guarding;Sore;Tender Pain Intervention(s): Monitored during session;Repositioned;Ice applied    Home Living                      Prior Function            PT Goals (current goals can now be found in the care plan section)      Frequency           PT Plan      Co-evaluation  End of Session Equipment Utilized During Treatment: Gait belt Activity Tolerance: Patient limited by pain Patient left: in chair;with call bell/phone within reach;with family/visitor present     Time: [id number] PT Time Calculation (min) (ACUTE ONLY): 42 min  Charges:  $Gait Training: 8-22 mins $Therapeutic Exercise: 8-22 mins $Therapeutic Activity: 8-22 mins                    G Codes:      Rica Koyanagi  PTA [facility]  Acute  Rehab Pager      [phone]

## 2016-11-10 DIAGNOSIS — M1712 Unilateral primary osteoarthritis, left knee: Secondary | ICD-10-CM | POA: Diagnosis not present

## 2016-11-15 DIAGNOSIS — M1712 Unilateral primary osteoarthritis, left knee: Secondary | ICD-10-CM | POA: Diagnosis not present

## 2016-11-17 DIAGNOSIS — M1712 Unilateral primary osteoarthritis, left knee: Secondary | ICD-10-CM | POA: Diagnosis not present

## 2016-11-21 DIAGNOSIS — M1712 Unilateral primary osteoarthritis, left knee: Secondary | ICD-10-CM | POA: Diagnosis not present

## 2016-11-21 DIAGNOSIS — Z96652 Presence of left artificial knee joint: Secondary | ICD-10-CM | POA: Diagnosis not present

## 2016-11-21 DIAGNOSIS — Z471 Aftercare following joint replacement surgery: Secondary | ICD-10-CM | POA: Diagnosis not present

## 2016-11-23 DIAGNOSIS — M1712 Unilateral primary osteoarthritis, left knee: Secondary | ICD-10-CM | POA: Diagnosis not present

## 2016-11-27 DIAGNOSIS — M1712 Unilateral primary osteoarthritis, left knee: Secondary | ICD-10-CM | POA: Diagnosis not present

## 2016-11-29 DIAGNOSIS — M1712 Unilateral primary osteoarthritis, left knee: Secondary | ICD-10-CM | POA: Diagnosis not present

## 2016-12-01 DIAGNOSIS — M1712 Unilateral primary osteoarthritis, left knee: Secondary | ICD-10-CM | POA: Diagnosis not present

## 2016-12-04 DIAGNOSIS — M1712 Unilateral primary osteoarthritis, left knee: Secondary | ICD-10-CM | POA: Diagnosis not present

## 2016-12-08 DIAGNOSIS — M1712 Unilateral primary osteoarthritis, left knee: Secondary | ICD-10-CM | POA: Diagnosis not present

## 2016-12-12 DIAGNOSIS — Z471 Aftercare following joint replacement surgery: Secondary | ICD-10-CM | POA: Diagnosis not present

## 2016-12-12 DIAGNOSIS — M1712 Unilateral primary osteoarthritis, left knee: Secondary | ICD-10-CM | POA: Diagnosis not present

## 2016-12-12 DIAGNOSIS — Z96652 Presence of left artificial knee joint: Secondary | ICD-10-CM | POA: Diagnosis not present

## 2016-12-14 DIAGNOSIS — Z85828 Personal history of other malignant neoplasm of skin: Secondary | ICD-10-CM | POA: Diagnosis not present

## 2016-12-14 DIAGNOSIS — M1712 Unilateral primary osteoarthritis, left knee: Secondary | ICD-10-CM | POA: Diagnosis not present

## 2016-12-14 DIAGNOSIS — L814 Other melanin hyperpigmentation: Secondary | ICD-10-CM | POA: Diagnosis not present

## 2016-12-14 DIAGNOSIS — L821 Other seborrheic keratosis: Secondary | ICD-10-CM | POA: Diagnosis not present

## 2016-12-18 DIAGNOSIS — N183 Chronic kidney disease, stage 3 (moderate): Secondary | ICD-10-CM | POA: Diagnosis not present

## 2016-12-18 DIAGNOSIS — E669 Obesity, unspecified: Secondary | ICD-10-CM | POA: Diagnosis not present

## 2016-12-18 DIAGNOSIS — M1A09X0 Idiopathic chronic gout, multiple sites, without tophus (tophi): Secondary | ICD-10-CM | POA: Diagnosis not present

## 2016-12-18 DIAGNOSIS — M150 Primary generalized (osteo)arthritis: Secondary | ICD-10-CM | POA: Diagnosis not present

## 2016-12-18 DIAGNOSIS — Z6833 Body mass index (BMI) 33.0-33.9, adult: Secondary | ICD-10-CM | POA: Diagnosis not present

## 2016-12-19 DIAGNOSIS — M1712 Unilateral primary osteoarthritis, left knee: Secondary | ICD-10-CM | POA: Diagnosis not present

## 2016-12-27 DIAGNOSIS — M1712 Unilateral primary osteoarthritis, left knee: Secondary | ICD-10-CM | POA: Diagnosis not present

## 2016-12-29 DIAGNOSIS — M1712 Unilateral primary osteoarthritis, left knee: Secondary | ICD-10-CM | POA: Diagnosis not present

## 2017-01-01 DIAGNOSIS — M1712 Unilateral primary osteoarthritis, left knee: Secondary | ICD-10-CM | POA: Diagnosis not present

## 2017-01-04 DIAGNOSIS — M1712 Unilateral primary osteoarthritis, left knee: Secondary | ICD-10-CM | POA: Diagnosis not present

## 2017-01-10 DIAGNOSIS — H04123 Dry eye syndrome of bilateral lacrimal glands: Secondary | ICD-10-CM | POA: Diagnosis not present

## 2017-01-10 DIAGNOSIS — H01003 Unspecified blepharitis right eye, unspecified eyelid: Secondary | ICD-10-CM | POA: Diagnosis not present

## 2017-01-10 DIAGNOSIS — H401232 Low-tension glaucoma, bilateral, moderate stage: Secondary | ICD-10-CM | POA: Diagnosis not present

## 2017-01-10 DIAGNOSIS — H02403 Unspecified ptosis of bilateral eyelids: Secondary | ICD-10-CM | POA: Diagnosis not present

## 2017-01-12 DIAGNOSIS — M1712 Unilateral primary osteoarthritis, left knee: Secondary | ICD-10-CM | POA: Diagnosis not present

## 2017-01-15 DIAGNOSIS — M1712 Unilateral primary osteoarthritis, left knee: Secondary | ICD-10-CM | POA: Diagnosis not present

## 2017-01-16 DIAGNOSIS — Z471 Aftercare following joint replacement surgery: Secondary | ICD-10-CM | POA: Diagnosis not present

## 2017-01-16 DIAGNOSIS — Z96652 Presence of left artificial knee joint: Secondary | ICD-10-CM | POA: Diagnosis not present

## 2017-01-18 DIAGNOSIS — M1712 Unilateral primary osteoarthritis, left knee: Secondary | ICD-10-CM | POA: Diagnosis not present

## 2017-01-22 DIAGNOSIS — I10 Essential (primary) hypertension: Secondary | ICD-10-CM | POA: Diagnosis not present

## 2017-01-22 DIAGNOSIS — Z6831 Body mass index (BMI) 31.0-31.9, adult: Secondary | ICD-10-CM | POA: Diagnosis not present

## 2017-01-22 DIAGNOSIS — E668 Other obesity: Secondary | ICD-10-CM | POA: Diagnosis not present

## 2017-01-22 DIAGNOSIS — M1990 Unspecified osteoarthritis, unspecified site: Secondary | ICD-10-CM | POA: Diagnosis not present

## 2017-01-22 DIAGNOSIS — I5022 Chronic systolic (congestive) heart failure: Secondary | ICD-10-CM | POA: Diagnosis not present

## 2017-01-22 DIAGNOSIS — I480 Paroxysmal atrial fibrillation: Secondary | ICD-10-CM | POA: Diagnosis not present

## 2017-01-22 DIAGNOSIS — E1129 Type 2 diabetes mellitus with other diabetic kidney complication: Secondary | ICD-10-CM | POA: Diagnosis not present

## 2017-01-22 DIAGNOSIS — Z7901 Long term (current) use of anticoagulants: Secondary | ICD-10-CM | POA: Diagnosis not present

## 2017-01-22 DIAGNOSIS — E784 Other hyperlipidemia: Secondary | ICD-10-CM | POA: Diagnosis not present

## 2017-01-22 DIAGNOSIS — H8110 Benign paroxysmal vertigo, unspecified ear: Secondary | ICD-10-CM | POA: Diagnosis not present

## 2017-01-22 DIAGNOSIS — R110 Nausea: Secondary | ICD-10-CM | POA: Diagnosis not present

## 2017-01-22 DIAGNOSIS — M1712 Unilateral primary osteoarthritis, left knee: Secondary | ICD-10-CM | POA: Diagnosis not present

## 2017-01-22 DIAGNOSIS — R1901 Right upper quadrant abdominal swelling, mass and lump: Secondary | ICD-10-CM | POA: Diagnosis not present

## 2017-01-24 ENCOUNTER — Other Ambulatory Visit: Payer: Self-pay | Admitting: Internal Medicine

## 2017-01-24 DIAGNOSIS — R110 Nausea: Secondary | ICD-10-CM

## 2017-01-24 DIAGNOSIS — M1712 Unilateral primary osteoarthritis, left knee: Secondary | ICD-10-CM | POA: Diagnosis not present

## 2017-02-01 ENCOUNTER — Ambulatory Visit
Admission: RE | Admit: 2017-02-01 | Discharge: 2017-02-01 | Disposition: A | Discharge status: Home / Self Care | Payer: Medicare Other | Source: Ambulatory Visit | Attending: Internal Medicine | Admitting: Internal Medicine

## 2017-02-01 DIAGNOSIS — R110 Nausea: Secondary | ICD-10-CM

## 2017-02-01 DIAGNOSIS — M1712 Unilateral primary osteoarthritis, left knee: Secondary | ICD-10-CM | POA: Diagnosis not present

## 2017-02-19 ENCOUNTER — Other Ambulatory Visit: Payer: Self-pay | Admitting: Physician Assistant

## 2017-02-19 DIAGNOSIS — I482 Chronic atrial fibrillation, unspecified: Secondary | ICD-10-CM

## 2017-02-19 DIAGNOSIS — I5022 Chronic systolic (congestive) heart failure: Secondary | ICD-10-CM

## 2017-03-08 ENCOUNTER — Ambulatory Visit (INDEPENDENT_AMBULATORY_CARE_PROVIDER_SITE_OTHER): Payer: Medicare Other | Admitting: Internal Medicine

## 2017-03-08 ENCOUNTER — Encounter: Payer: Self-pay | Admitting: Internal Medicine

## 2017-03-08 VITALS — BP 124/60 | HR 80 | Ht 69.0 in | Wt 218.0 lb

## 2017-03-08 DIAGNOSIS — R42 Dizziness and giddiness: Secondary | ICD-10-CM

## 2017-03-08 DIAGNOSIS — J4540 Moderate persistent asthma, uncomplicated: Secondary | ICD-10-CM | POA: Diagnosis not present

## 2017-03-08 DIAGNOSIS — G4733 Obstructive sleep apnea (adult) (pediatric): Secondary | ICD-10-CM

## 2017-03-08 DIAGNOSIS — H8110 Benign paroxysmal vertigo, unspecified ear: Secondary | ICD-10-CM

## 2017-03-08 MED ORDER — ONDANSETRON HCL 4 MG PO TABS: 4.0000 mg | ORAL_TABLET | Freq: Three times a day (TID) | ORAL | 2 refills | Status: DC | PRN

## 2017-03-08 MED ORDER — FLUTICASONE PROPIONATE 50 MCG/ACT NA SUSP: 2.0000 | Freq: Every day | NASAL | 3 refills | Status: DC | PRN

## 2017-03-08 MED ORDER — BUDESONIDE-FORMOTEROL FUMARATE 160-4.5 MCG/ACT IN AERO: 2.0000 | INHALATION_SPRAY | Freq: Two times a day (BID) | RESPIRATORY_TRACT | 0 refills | Status: DC

## 2017-03-08 MED ORDER — AEROCHAMBER MV MISC: 0 refills | Status: AC

## 2017-03-08 NOTE — Progress Notes (Signed)
Subjective:    Patient ID: Colton Brooks, male    DOB: 10-Jul-1941, 76 y.o.   MRN: [id number]  HPI  M former smoker followed for dyspnea, OSA, allergic rhinitis, asthma, complicated by DM 2, chronic A. fib, gout Office Spirometry 11/26/14- Mild obstructive airways disease  ----------------------------------------------------------------------------------------------------------  03/08/2016-76 year old M former smoker followed for dyspnea, OSA, allergic rhinitis, asthma, complicated by DM 2, chronic A. fib, gout CPAP 13/AHP FOLLOWS FOR: DME [facility]; DL attached. Pt states he wears CPAP almost every night for about 4-5 hours. No supplies needed at this time. Download okay with CPAP at 13. We discussed benefit of taking CPAP machine with him when he travels and availability of smaller portable machines.  03/08/17-76 year old M former smoker followed for dyspnea, OSA, allergic rhinitis, asthma, complicated by DM 2, chronic A. fib, gout CPAP 13/AHP Follows For: OSA, ok using CPAP, Breo is helping but voice gets raspy after use and would like to see if there is anything to help with that Breo causes less hoarseness than Advair. Complains of vertigo with for a year now, making him unsteady with risk of falls. Needs ENT referral and refill for nausea until he can be seen. Remains compliant with CPAP except that he traveled recently and forgot his CPAP cord until he got home. Definitely feels better with it. Control is good.  Review of Systems-See HPI Constitutional:   No-   weight loss, night sweats, fevers, chills, fatigue, lassitude. HEENT:   No-  headaches, difficulty swallowing, tooth/dental problems, sore throat,       No-  sneezing, itching, ear ache,  no  -nasal congestion, post nasal drip,  CV:  No-   chest pain, orthopnea, PND, swelling in lower extremities, anasarca, dizziness, palpitations Resp: +shortness of breath with exertion.              No-   productive cough,  No  non-productive cough,  No- coughing up of blood.              No-   change in color of mucus.  No- wheezing.   Skin: No-   rash or lesions. GI:  No-   heartburn, indigestion, abdominal pain, nausea, vomiting,  GU: No-   dysuria,  MS:  +Chronic joint pain or swelling.   Neuro-     nothing unusual Psych:  No- change in mood or affect. No depression or anxiety.  No memory loss.   Objective:   Physical Exam  General- Alert, Oriented, Affect-appropriate, Distress- none acute; overweight Skin- rash-none, lesions- none, excoriation- none Lymphadenopathy- none Head- atraumatic            Eyes- Gross vision intact, PERRLA, conjunctivae clear secretions            Ears- Hearing, canals-normal            Nose- Clear, no-Septal dev, mucus, polyps, erosion, perforation             Throat- Mallampati III-IV , mucosa clear , drainage- none, tonsils- atrophic,+ mild hoarseness Neck- flexible , trachea midline, no stridor , thyroid nl, carotid no bruit Chest - symmetrical excursion , unlabored           Heart/CV- +nearly regular pulse/ Atrial fib , no murmur , no gallop  , no rub, nl s1 s2                           - JVD- none , edema-+1,  stasis changes-+, varices- none           Lung- clear to P&A, wheeze- none, cough- none , dullness-none, rub- none           Chest wall-  Abd-  Br/ Gen/ Rectal- Not done, not indicated Extrem- cyanosis- none, clubbing, none, atrophy- none, strength- nl, +cane Neuro- grossly intact to observation-I do not see nystagmus

## 2017-03-08 NOTE — Patient Instructions (Signed)
Order- [facility]- continue CPAP 13, mask of choice, humidifier, supplies, AirView  Dx OSA  Order- referral to ENT Dr Radene Journey    Dx vertigo  Sample and script Symbicort 160  Maintenance inhaler    Inhale 2 puffs then rinse mouth, twice daily   Try this instead of Advair or Breo  Script provide Spacer device for use with Symbicort   Script refilling Zofran

## 2017-03-09 DIAGNOSIS — R42 Dizziness and giddiness: Secondary | ICD-10-CM | POA: Insufficient documentation

## 2017-03-09 NOTE — Assessment & Plan Note (Signed)
Persistent vertigo for a year is too long and needs evaluation. Plan-refill Zofran pending his return to his PCP. Referral to ENT. If that visit isn't helpful, recommend neurology

## 2017-03-09 NOTE — Assessment & Plan Note (Signed)
Not clear if dry powder or steroid is making him worse. We will let him try Symbicort with a spacer device.

## 2017-03-09 NOTE — Assessment & Plan Note (Signed)
Adequate compliance and good control with CPAP fixed pressure 13. He still tends to sleep CPAP at home for short trips.

## 2017-03-20 DIAGNOSIS — H26493 Other secondary cataract, bilateral: Secondary | ICD-10-CM | POA: Diagnosis not present

## 2017-03-20 DIAGNOSIS — H04123 Dry eye syndrome of bilateral lacrimal glands: Secondary | ICD-10-CM | POA: Diagnosis not present

## 2017-03-20 DIAGNOSIS — Z961 Presence of intraocular lens: Secondary | ICD-10-CM | POA: Diagnosis not present

## 2017-03-20 DIAGNOSIS — R42 Dizziness and giddiness: Secondary | ICD-10-CM | POA: Diagnosis not present

## 2017-03-20 DIAGNOSIS — H401232 Low-tension glaucoma, bilateral, moderate stage: Secondary | ICD-10-CM | POA: Diagnosis not present

## 2017-03-20 DIAGNOSIS — H9313 Tinnitus, bilateral: Secondary | ICD-10-CM | POA: Diagnosis not present

## 2017-03-20 DIAGNOSIS — H903 Sensorineural hearing loss, bilateral: Secondary | ICD-10-CM | POA: Diagnosis not present

## 2017-03-23 DIAGNOSIS — H903 Sensorineural hearing loss, bilateral: Secondary | ICD-10-CM | POA: Diagnosis not present

## 2017-03-23 DIAGNOSIS — R42 Dizziness and giddiness: Secondary | ICD-10-CM | POA: Diagnosis not present

## 2017-03-23 DIAGNOSIS — H9311 Tinnitus, right ear: Secondary | ICD-10-CM | POA: Diagnosis not present

## 2017-03-26 DIAGNOSIS — H35362 Drusen (degenerative) of macula, left eye: Secondary | ICD-10-CM | POA: Diagnosis not present

## 2017-03-26 DIAGNOSIS — H26493 Other secondary cataract, bilateral: Secondary | ICD-10-CM | POA: Diagnosis not present

## 2017-03-26 DIAGNOSIS — H401232 Low-tension glaucoma, bilateral, moderate stage: Secondary | ICD-10-CM | POA: Diagnosis not present

## 2017-03-26 DIAGNOSIS — E119 Type 2 diabetes mellitus without complications: Secondary | ICD-10-CM | POA: Diagnosis not present

## 2017-03-26 DIAGNOSIS — H26492 Other secondary cataract, left eye: Secondary | ICD-10-CM | POA: Diagnosis not present

## 2017-03-28 DIAGNOSIS — R42 Dizziness and giddiness: Secondary | ICD-10-CM | POA: Diagnosis not present

## 2017-04-17 ENCOUNTER — Encounter: Payer: Self-pay | Admitting: Neurology

## 2017-04-19 ENCOUNTER — Ambulatory Visit: Payer: Medicare Other | Admitting: Cardiology

## 2017-04-19 DIAGNOSIS — M1712 Unilateral primary osteoarthritis, left knee: Secondary | ICD-10-CM | POA: Diagnosis not present

## 2017-04-30 DIAGNOSIS — H04123 Dry eye syndrome of bilateral lacrimal glands: Secondary | ICD-10-CM | POA: Diagnosis not present

## 2017-04-30 DIAGNOSIS — H401232 Low-tension glaucoma, bilateral, moderate stage: Secondary | ICD-10-CM | POA: Diagnosis not present

## 2017-04-30 DIAGNOSIS — H01003 Unspecified blepharitis right eye, unspecified eyelid: Secondary | ICD-10-CM | POA: Diagnosis not present

## 2017-04-30 DIAGNOSIS — L718 Other rosacea: Secondary | ICD-10-CM | POA: Diagnosis not present

## 2017-04-30 LAB — HM DIABETES EYE EXAM

## 2017-05-24 DIAGNOSIS — N390 Urinary tract infection, site not specified: Secondary | ICD-10-CM | POA: Diagnosis not present

## 2017-05-24 DIAGNOSIS — E291 Testicular hypofunction: Secondary | ICD-10-CM | POA: Diagnosis not present

## 2017-05-24 DIAGNOSIS — E784 Other hyperlipidemia: Secondary | ICD-10-CM | POA: Diagnosis not present

## 2017-05-24 DIAGNOSIS — M109 Gout, unspecified: Secondary | ICD-10-CM | POA: Diagnosis not present

## 2017-05-24 DIAGNOSIS — E1129 Type 2 diabetes mellitus with other diabetic kidney complication: Secondary | ICD-10-CM | POA: Diagnosis not present

## 2017-05-24 DIAGNOSIS — R8299 Other abnormal findings in urine: Secondary | ICD-10-CM | POA: Diagnosis not present

## 2017-05-24 DIAGNOSIS — N183 Chronic kidney disease, stage 3 (moderate): Secondary | ICD-10-CM | POA: Diagnosis not present

## 2017-05-24 DIAGNOSIS — E038 Other specified hypothyroidism: Secondary | ICD-10-CM | POA: Diagnosis not present

## 2017-05-30 DIAGNOSIS — Z6834 Body mass index (BMI) 34.0-34.9, adult: Secondary | ICD-10-CM | POA: Diagnosis not present

## 2017-05-30 DIAGNOSIS — J45909 Unspecified asthma, uncomplicated: Secondary | ICD-10-CM | POA: Diagnosis not present

## 2017-05-30 DIAGNOSIS — I10 Essential (primary) hypertension: Secondary | ICD-10-CM | POA: Diagnosis not present

## 2017-05-30 DIAGNOSIS — I480 Paroxysmal atrial fibrillation: Secondary | ICD-10-CM | POA: Diagnosis not present

## 2017-05-30 DIAGNOSIS — Z1389 Encounter for screening for other disorder: Secondary | ICD-10-CM | POA: Diagnosis not present

## 2017-05-30 DIAGNOSIS — Z7901 Long term (current) use of anticoagulants: Secondary | ICD-10-CM | POA: Diagnosis not present

## 2017-05-30 DIAGNOSIS — Z0000 Encounter for general adult medical examination without abnormal findings: Secondary | ICD-10-CM | POA: Diagnosis not present

## 2017-05-30 DIAGNOSIS — E784 Other hyperlipidemia: Secondary | ICD-10-CM | POA: Diagnosis not present

## 2017-05-30 DIAGNOSIS — I428 Other cardiomyopathies: Secondary | ICD-10-CM | POA: Diagnosis not present

## 2017-05-30 DIAGNOSIS — I5022 Chronic systolic (congestive) heart failure: Secondary | ICD-10-CM | POA: Diagnosis not present

## 2017-05-30 DIAGNOSIS — E1129 Type 2 diabetes mellitus with other diabetic kidney complication: Secondary | ICD-10-CM | POA: Diagnosis not present

## 2017-05-30 DIAGNOSIS — Z23 Encounter for immunization: Secondary | ICD-10-CM | POA: Diagnosis not present

## 2017-05-31 ENCOUNTER — Other Ambulatory Visit (HOSPITAL_COMMUNITY): Payer: Self-pay | Admitting: Internal Medicine

## 2017-05-31 DIAGNOSIS — R42 Dizziness and giddiness: Secondary | ICD-10-CM

## 2017-06-01 ENCOUNTER — Ambulatory Visit (HOSPITAL_COMMUNITY)
Admission: RE | Admit: 2017-06-01 | Discharge: 2017-06-01 | Disposition: A | Discharge status: Home / Self Care | Payer: Medicare Other | Source: Ambulatory Visit | Attending: Vascular Surgery | Admitting: Vascular Surgery

## 2017-06-01 DIAGNOSIS — I771 Stricture of artery: Secondary | ICD-10-CM | POA: Insufficient documentation

## 2017-06-01 DIAGNOSIS — R42 Dizziness and giddiness: Secondary | ICD-10-CM

## 2017-06-01 LAB — VAS US CAROTID
LEFT ECA DIAS: -21 cm/s
LEFT VERTEBRAL DIAS: 9 cm/s
Left CCA dist dias: -15 cm/s
Left CCA dist sys: -65 cm/s
Left CCA prox dias: 22 cm/s
Left CCA prox sys: 99 cm/s
Left ICA dist dias: -21 cm/s
Left ICA dist sys: -82 cm/s
Left ICA prox dias: -19 cm/s
Left ICA prox sys: -62 cm/s
RIGHT CCA MID DIAS: -9 cm/s
RIGHT ECA DIAS: -9 cm/s
RIGHT VERTEBRAL DIAS: 14 cm/s
Right CCA prox dias: 17 cm/s
Right CCA prox sys: 153 cm/s
Right cca dist sys: -69 cm/s

## 2017-06-24 NOTE — Progress Notes (Signed)
06/25/2017 Colton Brooks   Apr 16, 1941  [id number]  Primary Physician Marton Redwood, MD Primary Cardiologist: Dr. Meda Coffee   Reason for Visit/CC:  4 months follow-up  HPI:  Colton Brooks is a 76 y.o. male, followed by Dr. Meda Coffee, with a hx of chronic AFib, bradycardia, OSA on CPAP, HTN, HL and DM2.  LHC in 2011 demonstrated no CAD. His last 2D echo 06/06/16 demonstrated normal LVEF at 55-60%, moderate LVH. No WMA. There was aortic sclerosis w/o stenosis. Trivial MR was noted. Compared to his prior echo 11/2015, his EF was improved (previously 45-50%). He is on Eliquis for a/c given chronic atrial fibrillation.   06/25/17 - the last seen in 08/2016 as preop for a TKR. He is coming after 4 months, he states that he feels great, his most recent knee replacement went much easier than previous. He has mild chronic lower extremity edema and no claudications, in fact he is able to bike 10 miles yesterday without any symptoms. He denies any shortness of breath or chest pain. He is compliant with his medications. His only concern is dizziness that has been diagnosed as vertigo and he is seeing ENT for that and using the meclizine. He also states that he feels dizzy at home sometimes with positional changes but sometimes for no reason but no falls. Occasional palpitations.    Current Meds  Medication Sig  . Alpha-D-Galactosidase (BEANO) TABS Take 1 tablet by mouth as needed (FOR GAS).   [name] amLODipine (NORVASC) 5 MG tablet Take 1 tablet (5 mg total) by mouth daily.  . ANDROGEL PUMP 20.25 MG/ACT (1.62%) GEL 4 pumps daily  . atorvastatin (LIPITOR) 80 MG tablet Take 80 mg by mouth at bedtime.   [name] azelastine (ASTELIN) 137 MCG/SPRAY nasal spray Place 1 spray into the nose daily as needed for rhinitis. Use in each nostril as directed   . budesonide-formoterol (SYMBICORT) 160-4.5 MCG/ACT inhaler Inhale 2 puffs into the lungs 2 (two) times daily.  . Coenzyme Q10 (COQ10) 100 MG CAPS Take 100 mg by mouth daily.     . colchicine 0.6 MG tablet Take 0.6 mg by mouth daily as needed (GOUT).   [name] desonide (DESOWEN) 0.05 % cream Apply 1 application topically daily as needed (SKIN IRRITATION).   [name] ELIQUIS 5 MG TABS tablet Take 1 tablet by mouth 2 (two) times daily.  . fluticasone (FLONASE) 50 MCG/ACT nasal spray Place 2 sprays into both nostrils daily as needed for allergies.  . fluticasone furoate-vilanterol (BREO ELLIPTA) 100-25 MCG/INH AEPB Inhale 1 puff into the lungs daily.  . furosemide (LASIX) 20 MG tablet Take 20 mg by mouth daily.    . hydrochlorothiazide (HYDRODIURIL) 25 MG tablet Take 25 mg by mouth daily.  . hydrocortisone (ANUSOL-HC) 2.5 % rectal cream Place 1 application rectally 2 (two) times daily as needed for hemorrhoids.   . Insulin Degludec (TRESIBA FLEXTOUCH [place]) Inject 20 Units into the skin at bedtime.  . insulin glargine (LANTUS) 100 UNIT/ML injection Inject 20 Units into the skin at bedtime.   [name] JARDIANCE 10 MG TABS tablet Take 10 mg by mouth daily.  [name] levothyroxine (SYNTHROID, LEVOTHROID) 50 MCG tablet Take 50 mcg by mouth daily.    . Loratadine (CLARITIN) 10 MG CAPS Take 1 capsule by mouth at bedtime.   [name] losartan (COZAAR) 50 MG tablet Take 1 tablet (50 mg total) by mouth 2 (two) times daily.  [name] LUMIGAN 0.01 % SOLN Place 1 drop into both eyes at bedtime.  . meclizine (  ANTIVERT) 25 MG tablet Take 25 mg by mouth 3 (three) times daily as needed.  . metaxalone (SKELAXIN) 800 MG tablet Take 1 tablet (800 mg total) by mouth 3 (three) times daily as needed for muscle spasms.  . metFORMIN (GLUCOPHAGE) 500 MG tablet Take 1,000 mg by mouth 2 (two) times daily with a meal.   . metoprolol succinate (TOPROL-XL) 100 MG 24 hr tablet Take one and one-half tablets by mouth in the morning and one tablet by mouth in the evening.  . montelukast (SINGULAIR) 10 MG tablet Take 20 mg by mouth at bedtime.   . OMEGA-3 KRILL OIL 300 MG CAPS Take 1 capsule by mouth daily.    . ondansetron (ZOFRAN) 4 MG tablet Take  1 tablet (4 mg total) by mouth every 8 (eight) hours as needed for nausea or vomiting.  [name] OZEMPIC 1 MG/DOSE SOPN as directed.  . Pancrelipase, Lip-Prot-Amyl, (CREON) 24000 UNITS CPEP Take 24,000-48,000 Units by mouth 3 (three) times daily as needed (digestion).   . pantoprazole (PROTONIX) 40 MG tablet Take 40 mg by mouth 2 (two) times daily.    . potassium chloride SA (K-DUR,KLOR-CON) 20 MEQ tablet Take 20 mEq by mouth daily.    . promethazine (PHENERGAN) 12.5 MG tablet Take 1-2 tablets (12.5-25 mg total) by mouth every 6 (six) hours as needed for nausea.  . Psyllium (HYDROCIL PO) Take 30 grams in 8 oz of water daily  . Salicylic Acid-Cleanser 6 % (LOTION) KIT Apply topically and occlude area at night (Patient taking differently: Apply topically and occlude area at night as needed for dryness)  . Simethicone (GAS-X PO) Take 180 mg by mouth as needed (gas).   [name] Spacer/Aero-Holding Chambers (AEROCHAMBER MV) inhaler Use as instructed  . traMADol (ULTRAM) 50 MG tablet Take 1-2 tablets (50-100 mg total) by mouth every 6 (six) hours as needed for moderate pain.   No Known Allergies Past Medical History:  Diagnosis Date  . AF (atrial fibrillation) ([place])   . Allergic rhinitis, cause unspecified   . Arthritis    OA  . Asthma   . Chronic kidney disease    AT 36 PERCENMT MANAGED BY DR SHAW  . Hypertension   . Hypothyroidism   . OSA on CPAP   . PONV (postoperative nausea and vomiting)    WITH GENERAL PREFERS SPINAL  . 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 Sore on leg    RIGHT LOWEER LEG HEALING X 3 WEEKS  . Type II or unspecified type diabetes mellitus without mention of complication, not stated as uncontrolled    TYPE 2  . Vertigo    LAST YEAR    Family History  Problem Relation Age of Onset  . Heart disease Mother        died at 66 resp failure  . Breast cancer Mother   . Emphysema Father        died at 79 resp failure  . Hypertension Sister     Past Surgical History:  Procedure Laterality Date  . CARDIAC CATHETERIZATION    . EYE SURGERY Bilateral    BOTH EYES IOC LENS REPLACEMENT CATARACT  . INSERTION OF MESH N/A 11/29/2015   Procedure: INSERTION OF MESH;  Surgeon: Coralie Keens, MD;  Location: [facility];  Service: General;  Laterality: N/A;  . JOINT REPLACEMENT Right 10/2010   KNEE TOTAL JOINT   . KNEE ARTHROSCOPY Right   .  LEFT HAND SURGERY  1980   TENDON REPAIR  . TONSILLECTOMY    . 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 UMBILICAL HERNIA REPAIR N/A 11/29/2015   Procedure: HERNIA REPAIR UMBILICAL ADULT WITH MESH;  Surgeon: Coralie Keens, MD;  Location: [place];  Service: General;  Laterality: N/A;   Social History   Social History  . Marital status: Married    Spouse name: N/A  . Number of children: N/A  . Years of education: N/A   Occupational History  . Retired     Scientist, research (life sciences)   Social History Main Topics  . Smoking status: Former Smoker    Packs/day: 1.00    Years: 26.00    Types: Cigarettes    Quit date: 11/20/1982  . Smokeless tobacco: Never Used  . Alcohol use Yes     Comment: SOCIAL  . Drug use: No  . Sexual activity: Not on file   Other Topics Concern  . Not on file   Social History Narrative  . No narrative on file     Review of Systems: General: negative for chills, fever, night sweats or weight changes.  Cardiovascular: negative for chest pain, dyspnea on exertion, edema, orthopnea, palpitations, paroxysmal nocturnal dyspnea or shortness of breath Dermatological: negative for rash Respiratory: negative for cough or wheezing Urologic: negative for hematuria Abdominal: negative for nausea, vomiting, diarrhea, bright red blood per rectum, melena, or hematemesis Neurologic: negative for visual changes,  syncope, or dizziness All other systems reviewed and are otherwise negative except as noted above.   Physical Exam:  Blood pressure 132/64, pulse (!) 55, height 5' 9"  (1.753 m), weight 230 lb (104.3 kg), SpO2 99 %.  General appearance: alert, cooperative and no distress Neck: no carotid bruit and no JVD Lungs: clear to auscultation bilaterally Heart: regular rate and rhythm, S1, S2 normal, no murmur, click, rub or gallop Extremities: no LEE, chronic bilateral venous stasis dermatitis  Pulses: 2+ and symmetric Skin: Skin color, texture, turgor normal. No rashes or lesions Neurologic: Grossly normal  EKG Performed today 06/25/2017, personally reviewed, atrial fibrillation with slow ventricular response 55 bpm otherwise unchanged from prior.   TTE: 05/2016 size was normal. Wall thickness was   increased in a pattern of moderate LVH. Systolic function was   normal. The estimated ejection fraction was in the range of 55%   to 60%. Wall motion was normal; there were no regional wall   motion abnormalities. The study is not technically sufficient to   allow evaluation of LV diastolic function. - Aortic valve: Trileaflet. Sclerosis without stenosis. There was   no regurgitation. - Mitral valve: Structurally normal valve. There was trivial   regurgitation. - Left atrium: Moderately dilated. - Right atrium: Moderately dilated. - Tricuspid valve: There was trivial regurgitation. - Pulmonary arteries: PA peak pressure: 25 mm Hg (S). - Inferior vena cava: The vessel was normal in size. The   respirophasic diameter changes were in the normal range (= 50%),   consistent with normal central venous pressure.  Impressions: - Compared to a prior study in 11/2015, the LVEF has improved to   55-60%.    ASSESSMENT AND PLAN:   1. Chronic Atrial Fibrillation: Slow ventricular response, the patient has vertigo but feels dizzy sometimes for no reason, we will try to decrease Toprol-XL to 50 mg  daily.  We will follow  in 3 months. Continue Eliquis for A/C. He has no bleeding.  2. Chronic Diastolic CHF: most recent echo 05/2016 showed an EF of 55-60%. He is euvolemic on exam. No dyspnea. Continue diuretic and BP meds for BP control.   3. HTN: well controlled on current regimen. Cutting down his Toprol-XL for bradycardia, we will monitor for hypertension and adjust BP medicines as needed.  4. DM: followed by PCP.   5. OSA: continue with CPAP nightly.   6. Hyperlipidemia - most recent lipids in 7/208 LDL 46, TG 72, HDL 29.  7. CKD stage 3 - Baseline Crea 1.3 - 1.4, in April 1.57, now back to 1.3 in July 2018.  PLAN  F/u with Dr. Meda Coffee in 6 months.   Ena Dawley, MD  06/25/2017 10:06 AM

## 2017-06-25 ENCOUNTER — Encounter: Payer: Self-pay | Admitting: Cardiology

## 2017-06-25 ENCOUNTER — Ambulatory Visit (INDEPENDENT_AMBULATORY_CARE_PROVIDER_SITE_OTHER): Payer: Medicare Other | Admitting: Cardiology

## 2017-06-25 VITALS — BP 132/64 | HR 55 | Ht 69.0 in | Wt 230.0 lb

## 2017-06-25 DIAGNOSIS — R42 Dizziness and giddiness: Secondary | ICD-10-CM | POA: Diagnosis not present

## 2017-06-25 DIAGNOSIS — I481 Persistent atrial fibrillation: Secondary | ICD-10-CM | POA: Diagnosis not present

## 2017-06-25 DIAGNOSIS — I4819 Other persistent atrial fibrillation: Secondary | ICD-10-CM

## 2017-06-25 DIAGNOSIS — I420 Dilated cardiomyopathy: Secondary | ICD-10-CM | POA: Diagnosis not present

## 2017-06-25 DIAGNOSIS — Z7901 Long term (current) use of anticoagulants: Secondary | ICD-10-CM

## 2017-06-25 DIAGNOSIS — I5022 Chronic systolic (congestive) heart failure: Secondary | ICD-10-CM

## 2017-06-25 DIAGNOSIS — I482 Chronic atrial fibrillation, unspecified: Secondary | ICD-10-CM

## 2017-06-25 DIAGNOSIS — I10 Essential (primary) hypertension: Secondary | ICD-10-CM

## 2017-06-25 DIAGNOSIS — I4891 Unspecified atrial fibrillation: Secondary | ICD-10-CM

## 2017-06-25 MED ORDER — METOPROLOL SUCCINATE ER 50 MG PO TB24: ORAL_TABLET | ORAL | 3 refills | Status: AC

## 2017-06-25 NOTE — Patient Instructions (Signed)
Medication Instructions:  Your physician has recommended you make the following change in your medication: \ 1) DECREASE Toprol from 100 mg daily to 50 mg by mouth daily   Labwork: none  Testing/Procedures: none  Follow-Up: Your physician recommends that you schedule a follow-up appointment in: 3 months with Dr. Meda Coffee.    Any Other Special Instructions Will Be Listed Below (If Applicable).     If you need a refill on your cardiac medications before your next appointment, please call your pharmacy.

## 2017-06-28 ENCOUNTER — Ambulatory Visit: Payer: Medicare Other | Admitting: Cardiology

## 2017-07-03 ENCOUNTER — Ambulatory Visit: Payer: Medicare Other | Admitting: Neurology

## 2017-07-17 ENCOUNTER — Encounter: Payer: Self-pay | Admitting: Neurology

## 2017-07-17 ENCOUNTER — Ambulatory Visit (INDEPENDENT_AMBULATORY_CARE_PROVIDER_SITE_OTHER): Payer: Medicare Other | Admitting: Neurology

## 2017-07-17 VITALS — BP 118/62 | HR 74

## 2017-07-17 DIAGNOSIS — R0989 Other specified symptoms and signs involving the circulatory and respiratory systems: Secondary | ICD-10-CM

## 2017-07-17 DIAGNOSIS — H8149 Vertigo of central origin, unspecified ear: Secondary | ICD-10-CM | POA: Diagnosis not present

## 2017-07-17 DIAGNOSIS — R42 Dizziness and giddiness: Secondary | ICD-10-CM | POA: Diagnosis not present

## 2017-07-17 DIAGNOSIS — H814 Vertigo of central origin: Secondary | ICD-10-CM

## 2017-07-17 NOTE — Patient Instructions (Signed)
1.  We will check MRI of brain and internal auditory canals with and without contrast and MRA of head 2.  Follow up after testing.

## 2017-07-17 NOTE — Progress Notes (Addendum)
NEUROLOGY CONSULTATION NOTE  Colton Brooks MRN: [id number] DOB: Nov 01, 1941  Referring provider: Dr. Brigitte Pulse Primary care provider: Dr. Brigitte Pulse  Reason for consult:  Vertigo.  HISTORY OF PRESENT ILLNESS: Colton Brooks is a 76 year old right-handed male with chronic CHF, atrial fibrillation, dilated cardiomyopathy, hypertension and diabetes who presents for vertigo.  He is accompanied by his wife who supplements history.  In May 2017, he developed vertigo.  It was thought to be due to [facility], which he had been on for 6 months.  In addition, he developed a rash and pruritis.  Symptoms improved after discontinuation of the medication, but never resolved.  He has it daily.  He describes it as an acute onset spinning sensation.  It usually positional, such as turning over in bed or gazing up or down, but could be spontaneous as well.  Turning to the right is worse than to the left.  When he wakes up in bed in the morning, he feels dizzy.  He staggers when he walks.  Spells typically last a few seconds but on some occasions last up to 30 minutes.  There is associated nausea and he would likely vomit if not for an antiemetic.  He reports some hearing loss in both ears, as well as tinnitus.  He denies associated lightheadedness, headache, double vision, unilateral facial or extremity numbness or weakness.    He has tried several therapies.  Meclizine and acupuncture provide mild relief.  He also sees a Restaurant manager, fast food.  Vestibular rehab was ineffective.  He was evaluated by ENT and underwent audiogram/ENG, which reportedly did not demonstrate peripheral etiology.  He reports one prior episode of vertigo 10 years ago, a reaction to a cold medicine with codeine.  He has atrial fibrillation and reports daily episodes of racing heartbeat lasting just seconds.  05/24/17 Labs:  Hgb A1c 5.8  PAST MEDICAL HISTORY: Past Medical History:  Diagnosis Date  . AF (atrial fibrillation) ([place])   . Allergic  rhinitis, cause unspecified   . Arthritis    OA  . Asthma   . Chronic kidney disease    AT 14 PERCENMT MANAGED BY DR SHAW  . Hypertension   . Hypothyroidism   . OSA on CPAP   . PONV (postoperative nausea and vomiting)    WITH GENERAL PREFERS SPINAL  . 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 Sore on leg    RIGHT LOWEER LEG HEALING X 3 WEEKS  . Type II or unspecified type diabetes mellitus without mention of complication, not stated as uncontrolled    TYPE 2  . Vertigo    LAST YEAR     PAST SURGICAL HISTORY: Past Surgical History:  Procedure Laterality Date  . CARDIAC CATHETERIZATION    . EYE SURGERY Bilateral    BOTH EYES IOC LENS REPLACEMENT CATARACT  . INSERTION OF MESH N/A 11/29/2015   Procedure: INSERTION OF MESH;  Surgeon: Coralie Keens, MD;  Location: [facility];  Service: General;  Laterality: N/A;  . JOINT REPLACEMENT Right 10/2010   KNEE TOTAL JOINT   . KNEE ARTHROSCOPY Right   . LEFT HAND SURGERY  1980   TENDON REPAIR  . TONSILLECTOMY    . 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 UMBILICAL HERNIA REPAIR N/A 11/29/2015   Procedure: HERNIA  REPAIR UMBILICAL ADULT WITH MESH;  Surgeon: Coralie Keens, MD;  Location: [facility];  Service: General;  Laterality: N/A;    MEDICATIONS: Current Outpatient Prescriptions on File Prior to Visit  Medication Sig Dispense Refill  . Alpha-D-Galactosidase (BEANO) TABS Take 1 tablet by mouth as needed (FOR GAS).     [name] amLODipine (NORVASC) 5 MG tablet Take 1 tablet (5 mg total) by mouth daily. 180 tablet 1  . ANDROGEL PUMP 20.25 MG/ACT (1.62%) GEL 4 pumps daily    . atorvastatin (LIPITOR) 80 MG tablet Take 80 mg by mouth at bedtime.     [name] azelastine (ASTELIN) 137 MCG/SPRAY nasal spray Place 1 spray into the nose daily as needed  for rhinitis. Use in each nostril as directed     . budesonide-formoterol (SYMBICORT) 160-4.5 MCG/ACT inhaler Inhale 2 puffs into the lungs 2 (two) times daily. 2 Inhaler 0  . Coenzyme Q10 (COQ10) 100 MG CAPS Take 100 mg by mouth daily.     . colchicine 0.6 MG tablet Take 0.6 mg by mouth daily as needed (GOUT).     [name] desonide (DESOWEN) 0.05 % cream Apply 1 application topically daily as needed (SKIN IRRITATION).     [name] ELIQUIS 5 MG TABS tablet Take 1 tablet by mouth 2 (two) times daily.    . fluticasone (FLONASE) 50 MCG/ACT nasal spray Place 2 sprays into both nostrils daily as needed for allergies. 48 g 3  . fluticasone furoate-vilanterol (BREO ELLIPTA) 100-25 MCG/INH AEPB Inhale 1 puff into the lungs daily. 1 each 3  . furosemide (LASIX) 20 MG tablet Take 20 mg by mouth daily.      . hydrochlorothiazide (HYDRODIURIL) 25 MG tablet Take 25 mg by mouth daily.    . hydrocortisone (ANUSOL-HC) 2.5 % rectal cream Place 1 application rectally 2 (two) times daily as needed for hemorrhoids.     . Insulin Degludec (TRESIBA FLEXTOUCH [place]) Inject 20 Units into the skin at bedtime.    [name] JARDIANCE 10 MG TABS tablet Take 10 mg by mouth daily.    [name] levothyroxine (SYNTHROID, LEVOTHROID) 50 MCG tablet Take 50 mcg by mouth daily.      . Loratadine (CLARITIN) 10 MG CAPS Take 1 capsule by mouth at bedtime.     [name] losartan (COZAAR) 50 MG tablet Take 1 tablet (50 mg total) by mouth 2 (two) times daily. 180 tablet 3  . LUMIGAN 0.01 % SOLN Place 1 drop into both eyes at bedtime.    . meclizine (ANTIVERT) 25 MG tablet Take 25 mg by mouth 3 (three) times daily as needed.    . metaxalone (SKELAXIN) 800 MG tablet Take 1 tablet (800 mg total) by mouth 3 (three) times daily as needed for muscle spasms. 90 tablet 0  . metFORMIN (GLUCOPHAGE) 500 MG tablet Take 1,000 mg by mouth 2 (two) times daily with a meal.     . metoprolol succinate (TOPROL-XL) 50 MG 24 hr tablet Take one and one-half tablets by mouth in the morning and one  tablet by mouth in the evening. 90 tablet 3  . montelukast (SINGULAIR) 10 MG tablet Take 20 mg by mouth at bedtime.     . OMEGA-3 KRILL OIL 300 MG CAPS Take 1 capsule by mouth daily.      . ondansetron (ZOFRAN) 4 MG tablet Take 1 tablet (4 mg total) by mouth every 8 (eight) hours as needed for nausea or vomiting. 20 tablet 2  . OZEMPIC 1 MG/DOSE SOPN as directed.    [name]  Pancrelipase, Lip-Prot-Amyl, (CREON) 24000 UNITS CPEP Take 24,000-48,000 Units by mouth 3 (three) times daily as needed (digestion).     . pantoprazole (PROTONIX) 40 MG tablet Take 40 mg by mouth 2 (two) times daily.      . potassium chloride SA (K-DUR,KLOR-CON) 20 MEQ tablet Take 20 mEq by mouth daily.      . promethazine (PHENERGAN) 12.5 MG tablet Take 1-2 tablets (12.5-25 mg total) by mouth every 6 (six) hours as needed for nausea. 40 tablet 0  . Psyllium (HYDROCIL PO) Take 30 grams in 8 oz of water daily    . Salicylic Acid-Cleanser 6 % (LOTION) KIT Apply topically and occlude area at night (Patient taking differently: Apply topically and occlude area at night as needed for dryness) 1 kit prn  . Simethicone (GAS-X PO) Take 180 mg by mouth as needed (gas).     [name] Spacer/Aero-Holding Chambers (AEROCHAMBER MV) inhaler Use as instructed 1 each 0  . traMADol (ULTRAM) 50 MG tablet Take 1-2 tablets (50-100 mg total) by mouth every 6 (six) hours as needed for moderate pain. 80 tablet 1   No current facility-administered medications on file prior to visit.     ALLERGIES: No Known Allergies  FAMILY HISTORY: Family History  Problem Relation Age of Onset  . Heart disease Mother        died at 72 resp failure  . Breast cancer Mother   . Emphysema Father        died at 52 resp failure  . Hypertension Sister     SOCIAL HISTORY: Social History   Social History  . Marital status: Married    Spouse name: N/A  . Number of children: N/A  . Years of education: N/A   Occupational History  . Retired     Scientist, research (life sciences)    Social History Main Topics  . Smoking status: Former Smoker    Packs/day: 1.00    Years: 26.00    Types: Cigarettes    Quit date: 11/20/1982  . Smokeless tobacco: Never Used  . Alcohol use Yes     Comment: SOCIAL  . Drug use: No  . Sexual activity: Not on file   Other Topics Concern  . Not on file   Social History Narrative  . No narrative on file    REVIEW OF SYSTEMS: Constitutional: No fevers, chills, or sweats, no generalized fatigue, change in appetite Eyes: No visual changes, double vision, eye pain Ear, nose and throat: No hearing loss, ear pain, nasal congestion, sore throat Cardiovascular: No chest pain, palpitations Respiratory:  No shortness of breath at rest or with exertion, wheezes GastrointestinaI: No nausea, vomiting, diarrhea, abdominal pain, fecal incontinence Genitourinary:  No dysuria, urinary retention or frequency Musculoskeletal:  No neck pain, back pain Integumentary: No rash, pruritus, skin lesions Neurological: as above Psychiatric: No depression, insomnia, anxiety Endocrine: No palpitations, fatigue, diaphoresis, mood swings, change in appetite, change in weight, increased thirst Hematologic/Lymphatic:  No purpura, petechiae. Allergic/Immunologic: no itchy/runny eyes, nasal congestion, recent allergic reactions, rashes  PHYSICAL EXAM: Vitals:   07/17/17 1027 07/17/17 1028  BP: 130/62 118/62  Pulse: 74 74  SpO2: 95% 95%   General: No acute distress.  Patient appears well-groomed.  Head:  Normocephalic/atraumatic Eyes:  fundi examined but not visualized Neck: supple, no paraspinal tenderness, full range of motion Back: No paraspinal tenderness Heart: regular rate, irregular rhythm Lungs: Clear to auscultation bilaterally. Vascular: No carotid bruits. Neurological Exam: Mental status: alert and oriented to person, place, and  time, recent and remote memory intact, fund of knowledge intact, attention and concentration intact, speech fluent and  not dysarthric, language intact. Cranial nerves: CN I: not tested CN II: pupils equal, round and reactive to light, visual fields intact CN III, IV, VI:  full range of motion, no nystagmus, no ptosis CN V: facial sensation intact CN VII: upper and lower face symmetric CN VIII: hearing intact CN IX, X: gag intact, uvula midline CN XI: sternocleidomastoid and trapezius muscles intact CN XII: tongue midline Bulk & Tone: normal, no fasciculations. Motor:  5/5 throughout  Sensation: temperature and vibration sensation intact. Deep Tendon Reflexes:  2+ throughout, toes downgoing.  Finger to nose testing:  Without dysmetria.  Heel to shin:  Without dysmetria.  Gait:  Normal station and stride.  Able to turn and tandem walk. Romberg positive. Dix-Hallpike test positive to the right.  IMPRESSION: Vertigo.  Semiology and Dix-Hallpike findings are consistent with peripheral etiology (benign paroxysmal positional vertigo).  However, as this is a chronic daily issue lasting over a year, and he has failed therapy, further evaluation is warranted.  PLAN: 1.  We will get MRI brain/IAC w/wo contrast and MRA of head and neck to evaluate posterior fossa and posterior circulation. 2.  Ultimately, symptoms may be treated with clonazepam. 3.  Follow up after testing. 4.  Blood pressure borderline elevated.  Should be rechecked with PCP.  Thank you for allowing me to take part in the care of this patient.  Metta Clines, DO  CC:  Marton Redwood, MD

## 2017-07-25 ENCOUNTER — Telehealth: Payer: Self-pay | Admitting: Neurology

## 2017-07-25 NOTE — Telephone Encounter (Signed)
Pt's wife called and said no one has contacted them yet regarding scheduling pt's MRI and MRA, please advise

## 2017-07-26 NOTE — Telephone Encounter (Signed)
Called Pts wife, Jennette Banker her I have spoken with Denton Ar at [facility] this morning and they will be contacting them today to scheduel the imaging tests.

## 2017-08-13 ENCOUNTER — Ambulatory Visit
Admission: RE | Admit: 2017-08-13 | Discharge: 2017-08-13 | Disposition: A | Discharge status: Home / Self Care | Payer: Medicare Other | Source: Ambulatory Visit | Attending: Neurology | Admitting: Neurology

## 2017-08-13 DIAGNOSIS — R0989 Other specified symptoms and signs involving the circulatory and respiratory systems: Secondary | ICD-10-CM

## 2017-08-13 DIAGNOSIS — H814 Vertigo of central origin: Secondary | ICD-10-CM

## 2017-08-13 DIAGNOSIS — R42 Dizziness and giddiness: Secondary | ICD-10-CM | POA: Diagnosis not present

## 2017-08-13 MED ORDER — GADOBENATE DIMEGLUMINE 529 MG/ML IV SOLN
20.0000 mL | Freq: Once | INTRAVENOUS | Status: AC | PRN
  Administered 2017-08-13: 20 mL via INTRAVENOUS

## 2017-08-17 ENCOUNTER — Telehealth: Payer: Self-pay

## 2017-08-17 NOTE — Telephone Encounter (Signed)
-----   Message from Pieter Partridge, DO sent at 08/14/2017 12:12 PM EDT ----- MRI of brain and blood vessels do not reveal any concerning cause for dizziness.

## 2017-08-17 NOTE — Telephone Encounter (Signed)
Called and spoke with wife, advsd of MRI result

## 2017-09-03 ENCOUNTER — Ambulatory Visit: Payer: Medicare Other | Admitting: Neurology

## 2017-09-07 ENCOUNTER — Encounter: Payer: Self-pay | Admitting: Neurology

## 2017-09-07 ENCOUNTER — Ambulatory Visit (INDEPENDENT_AMBULATORY_CARE_PROVIDER_SITE_OTHER): Payer: Medicare Other | Admitting: Neurology

## 2017-09-07 VITALS — BP 140/60 | HR 60 | Ht 69.0 in | Wt 229.0 lb

## 2017-09-07 DIAGNOSIS — R42 Dizziness and giddiness: Secondary | ICD-10-CM | POA: Diagnosis not present

## 2017-09-07 DIAGNOSIS — R0989 Other specified symptoms and signs involving the circulatory and respiratory systems: Secondary | ICD-10-CM

## 2017-09-07 MED ORDER — VENLAFAXINE HCL ER 75 MG PO CP24: 75.0000 mg | ORAL_CAPSULE | Freq: Every day | ORAL | 2 refills | Status: DC

## 2017-09-07 NOTE — Progress Notes (Signed)
NEUROLOGY FOLLOW UP OFFICE NOTE  Colton Brooks [id number]  HISTORY OF PRESENT ILLNESS: Colton Brooks is a 76 year old right-handed male with chronic CHF, atrial fibrillation, dilated cardiomyopathy, hypertension and diabetes who follows up for vertigo.  He is accompanied by his wife who supplements history.  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  MRA of head showed no significant proximal or large intracranial vessel stenosis or occlusion.  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    He still has sensation of being off balance but he hasn't had any acute severe episodes.   HISTORY: In May 2017, he developed vertigo.  It was thought to be due to [facility], which he had been on for 6 months.  In addition, he developed a rash and pruritis.  Symptoms improved after discontinuation of the medication, but never resolved.  He has it daily.  He describes it as an acute onset spinning sensation.  It usually positional, such as turning over in bed or gazing up or down, but could be spontaneous as well.  Turning to the right is worse than to the left.  When he wakes up in bed in the morning, he feels dizzy.  He staggers when he walks.  Spells typically last a few seconds but on some occasions last up to 30 minutes.  There is associated nausea and he would likely vomit if not for an antiemetic.  He reports some hearing loss in both ears, as well as tinnitus.  He denies associated lightheadedness, headache, double vision, unilateral facial or extremity numbness or weakness.     He has tried several therapies.  Meclizine and acupuncture provide mild relief.  He also sees a Restaurant manager, fast food.  Vestibular rehab was ineffective.   He was evaluated by ENT and underwent audiogram/ENG, which  reportedly did not demonstrate peripheral etiology.   He reports one prior episode of vertigo 10 years ago, a reaction to a cold medicine with codeine.  He has atrial fibrillation and reports daily episodes of racing heartbeat lasting just seconds.  PAST MEDICAL HISTORY: Past Medical History:  Diagnosis Date  . AF (atrial fibrillation) ([place])   . Allergic rhinitis, cause unspecified   . Arthritis    OA  . Asthma   . Chronic kidney disease    AT 31 PERCENMT MANAGED BY DR SHAW  . Hypertension   . Hypothyroidism   . OSA on CPAP   . PONV (postoperative nausea and vomiting)    WITH GENERAL PREFERS SPINAL  . 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 Sore on leg    RIGHT LOWEER LEG HEALING X 3 WEEKS  . Type II or unspecified type diabetes mellitus without mention of complication, not stated as uncontrolled    TYPE 2  . Vertigo    LAST YEAR     MEDICATIONS: Current Outpatient Prescriptions on File Prior to Visit  Medication Sig Dispense Refill  . Alpha-D-Galactosidase (BEANO) TABS Take 1 tablet by mouth as needed (FOR GAS).     [name] amLODipine (NORVASC) 5 MG tablet Take 1 tablet (5 mg total) by mouth daily. 180 tablet 1  . ANDROGEL PUMP 20.25 MG/ACT (1.62%) GEL 4 pumps daily    . atorvastatin (LIPITOR) 80 MG tablet  Take 80 mg by mouth at bedtime.     [name] azelastine (ASTELIN) 137 MCG/SPRAY nasal spray Place 1 spray into the nose daily as needed for rhinitis. Use in each nostril as directed     . budesonide-formoterol (SYMBICORT) 160-4.5 MCG/ACT inhaler Inhale 2 puffs into the lungs 2 (two) times daily. 2 Inhaler 0  . Coenzyme Q10 (COQ10) 100 MG CAPS Take 100 mg by mouth daily.     . colchicine 0.6 MG tablet Take 0.6 mg by mouth daily as needed (GOUT).     [name] desonide (DESOWEN) 0.05 % cream Apply 1 application topically daily as needed (SKIN IRRITATION).     [name] ELIQUIS 5 MG TABS tablet Take 1 tablet by mouth 2 (two) times daily.    . fluticasone (FLONASE)  50 MCG/ACT nasal spray Place 2 sprays into both nostrils daily as needed for allergies. 48 g 3  . fluticasone furoate-vilanterol (BREO ELLIPTA) 100-25 MCG/INH AEPB Inhale 1 puff into the lungs daily. 1 each 3  . furosemide (LASIX) 20 MG tablet Take 20 mg by mouth daily.      . hydrochlorothiazide (HYDRODIURIL) 25 MG tablet Take 25 mg by mouth daily.    . hydrocortisone (ANUSOL-HC) 2.5 % rectal cream Place 1 application rectally 2 (two) times daily as needed for hemorrhoids.     . Insulin Degludec (TRESIBA FLEXTOUCH [place]) Inject 20 Units into the skin at bedtime.    [name] JARDIANCE 10 MG TABS tablet Take 10 mg by mouth daily.    [name] levothyroxine (SYNTHROID, LEVOTHROID) 50 MCG tablet Take 50 mcg by mouth daily.      . Loratadine (CLARITIN) 10 MG CAPS Take 1 capsule by mouth at bedtime.     [name] losartan (COZAAR) 50 MG tablet Take 1 tablet (50 mg total) by mouth 2 (two) times daily. 180 tablet 3  . LUMIGAN 0.01 % SOLN Place 1 drop into both eyes at bedtime.    . meclizine (ANTIVERT) 25 MG tablet Take 25 mg by mouth 3 (three) times daily as needed.    . metaxalone (SKELAXIN) 800 MG tablet Take 1 tablet (800 mg total) by mouth 3 (three) times daily as needed for muscle spasms. 90 tablet 0  . metFORMIN (GLUCOPHAGE) 500 MG tablet Take 1,000 mg by mouth 2 (two) times daily with a meal.     . metoprolol succinate (TOPROL-XL) 50 MG 24 hr tablet Take one and one-half tablets by mouth in the morning and one tablet by mouth in the evening. 90 tablet 3  . montelukast (SINGULAIR) 10 MG tablet Take 20 mg by mouth at bedtime.     . OMEGA-3 KRILL OIL 300 MG CAPS Take 1 capsule by mouth daily.      . ondansetron (ZOFRAN) 4 MG tablet Take 1 tablet (4 mg total) by mouth every 8 (eight) hours as needed for nausea or vomiting. 20 tablet 2  . OZEMPIC 1 MG/DOSE SOPN as directed.    . Pancrelipase, Lip-Prot-Amyl, (CREON) 24000 UNITS CPEP Take 24,000-48,000 Units by mouth 3 (three) times daily as needed (digestion).     .  pantoprazole (PROTONIX) 40 MG tablet Take 40 mg by mouth 2 (two) times daily.      . potassium chloride SA (K-DUR,KLOR-CON) 20 MEQ tablet Take 20 mEq by mouth daily.      . promethazine (PHENERGAN) 12.5 MG tablet Take 1-2 tablets (12.5-25 mg total) by mouth every 6 (six) hours as needed for nausea. 40 tablet 0  . Psyllium (HYDROCIL PO) Take  30 grams in 8 oz of water daily    . Salicylic Acid-Cleanser 6 % (LOTION) KIT Apply topically and occlude area at night (Patient taking differently: Apply topically and occlude area at night as needed for dryness) 1 kit prn  . Simethicone (GAS-X PO) Take 180 mg by mouth as needed (gas).     [name] Spacer/Aero-Holding Chambers (AEROCHAMBER MV) inhaler Use as instructed 1 each 0  . traMADol (ULTRAM) 50 MG tablet Take 1-2 tablets (50-100 mg total) by mouth every 6 (six) hours as needed for moderate pain. (Patient not taking: Reported on 09/07/2017) 80 tablet 1   No current facility-administered medications on file prior to visit.     ALLERGIES: No Known Allergies  FAMILY HISTORY: Family History  Problem Relation Age of Onset  . Heart disease Mother        died at 69 resp failure  . Breast cancer Mother   . Emphysema Father        died at 21 resp failure  . Hypertension Sister     SOCIAL HISTORY: Social History   Social History  . Marital status: Married    Spouse name: N/A  . Number of children: N/A  . Years of education: N/A   Occupational History  . Retired     Scientist, research (life sciences)   Social History Main Topics  . Smoking status: Former Smoker    Packs/day: 1.00    Years: 26.00    Types: Cigarettes    Quit date: 11/20/1982  . Smokeless tobacco: Never Used  . Alcohol use Yes     Comment: SOCIAL  . Drug use: No  . Sexual activity: Not on file   Other Topics Concern  . Not on file   Social History Narrative  . No narrative on file    REVIEW OF SYSTEMS: Constitutional: No fevers, chills, or sweats, no generalized fatigue, change in  appetite Eyes: No visual changes, double vision, eye pain Ear, nose and throat: No hearing loss, ear pain, nasal congestion, sore throat Cardiovascular: No chest pain, palpitations Respiratory:  No shortness of breath at rest or with exertion, wheezes GastrointestinaI: No nausea, vomiting, diarrhea, abdominal pain, fecal incontinence Genitourinary:  No dysuria, urinary retention or frequency Musculoskeletal:  No neck pain, back pain Integumentary: No rash, pruritus, skin lesions Neurological: as above Psychiatric: No depression, insomnia, anxiety Endocrine: No palpitations, fatigue, diaphoresis, mood swings, change in appetite, change in weight, increased thirst Hematologic/Lymphatic:  No purpura, petechiae. Allergic/Immunologic: no itchy/runny eyes, nasal congestion, recent allergic reactions, rashes  PHYSICAL EXAM: Vitals:   09/07/17 1303  BP: 140/60  Pulse: 60  SpO2: 98%   General: No acute distress.  Patient appears well-groomed.    IMPRESSION: Vertigo/dizziness.  No specific etiology found.  Meclizine and vestibular rehab ineffective  PLAN: 1.  Will start venlafaxine XR 35m daily to see if it helps with symptoms 2.  Follow up in 3 months.  15 minutes spent face to face with patient, 100% spent discussing MRI results and management.  AMetta Clines DO  CC:  WMarton Redwood MD

## 2017-09-07 NOTE — Patient Instructions (Signed)
1.  Start venlafaxine XR 75mg  daily (try taking in the morning with breakfast) 2.  Follow up in 3 months.

## 2017-09-15 ENCOUNTER — Encounter: Payer: Self-pay | Admitting: Neurology

## 2017-09-18 ENCOUNTER — Telehealth: Payer: Self-pay

## 2017-09-26 NOTE — Telephone Encounter (Signed)
Opened in error

## 2017-09-27 DIAGNOSIS — Z6833 Body mass index (BMI) 33.0-33.9, adult: Secondary | ICD-10-CM | POA: Diagnosis not present

## 2017-09-27 DIAGNOSIS — Z7901 Long term (current) use of anticoagulants: Secondary | ICD-10-CM | POA: Diagnosis not present

## 2017-09-27 DIAGNOSIS — I10 Essential (primary) hypertension: Secondary | ICD-10-CM | POA: Diagnosis not present

## 2017-09-27 DIAGNOSIS — Z471 Aftercare following joint replacement surgery: Secondary | ICD-10-CM | POA: Diagnosis not present

## 2017-09-27 DIAGNOSIS — I480 Paroxysmal atrial fibrillation: Secondary | ICD-10-CM | POA: Diagnosis not present

## 2017-09-27 DIAGNOSIS — Z96651 Presence of right artificial knee joint: Secondary | ICD-10-CM | POA: Diagnosis not present

## 2017-09-27 DIAGNOSIS — E1129 Type 2 diabetes mellitus with other diabetic kidney complication: Secondary | ICD-10-CM | POA: Diagnosis not present

## 2017-09-27 DIAGNOSIS — M170 Bilateral primary osteoarthritis of knee: Secondary | ICD-10-CM | POA: Diagnosis not present

## 2017-09-27 DIAGNOSIS — H8110 Benign paroxysmal vertigo, unspecified ear: Secondary | ICD-10-CM | POA: Diagnosis not present

## 2017-09-27 DIAGNOSIS — G4733 Obstructive sleep apnea (adult) (pediatric): Secondary | ICD-10-CM | POA: Diagnosis not present

## 2017-09-27 DIAGNOSIS — Z96653 Presence of artificial knee joint, bilateral: Secondary | ICD-10-CM | POA: Diagnosis not present

## 2017-09-27 DIAGNOSIS — Z96652 Presence of left artificial knee joint: Secondary | ICD-10-CM | POA: Diagnosis not present

## 2017-10-03 ENCOUNTER — Ambulatory Visit (INDEPENDENT_AMBULATORY_CARE_PROVIDER_SITE_OTHER): Payer: Medicare Other | Admitting: Cardiology

## 2017-10-03 DIAGNOSIS — I739 Peripheral vascular disease, unspecified: Secondary | ICD-10-CM

## 2017-10-03 DIAGNOSIS — I10 Essential (primary) hypertension: Secondary | ICD-10-CM

## 2017-10-03 DIAGNOSIS — I482 Chronic atrial fibrillation, unspecified: Secondary | ICD-10-CM

## 2017-10-03 DIAGNOSIS — R0989 Other specified symptoms and signs involving the circulatory and respiratory systems: Secondary | ICD-10-CM | POA: Diagnosis not present

## 2017-10-03 DIAGNOSIS — I5022 Chronic systolic (congestive) heart failure: Secondary | ICD-10-CM | POA: Diagnosis not present

## 2017-10-03 NOTE — Patient Instructions (Signed)
Medication Instructions:   Your physician recommends that you continue on your current medications as directed. Please refer to the Current Medication list given to you today.    Labwork:  TODAY--CMET, CBC W DIFF, TSH       Testing/Procedures:  Your physician has requested that you have a lower extremity arterial duplex. This test is an ultrasound of the arteries in the legs. It looks at arterial blood flow in the legs. Allow one hour for Lower  scans. There are no restrictions or special instructions      Follow-Up:  Your physician wants you to follow-up in: [place] will receive a reminder letter in the mail two months in advance. If you don't receive a letter, please call our office to schedule the follow-up appointment.        If you need a refill on your cardiac medications before your next appointment, please call your pharmacy.

## 2017-10-03 NOTE — Progress Notes (Signed)
Cardiology Office Note:    Date:  10/03/2017   ID:  Colton Brooks, DOB 06/29/1941, MRN [id number]  PCP:  Marton Redwood, MD  Cardiologist:  Ena Dawley, MD    Referring MD: Marton Redwood, MD   Chief complain: 3 months follow up  History of Present Illness:    Colton Brooks is a 76 y.o. adult with a hx of chronic AFib, bradycardia, OSA on CPAP, HTN, HL and DM2. LHC in 2011 demonstrated no CAD. His last 2D echo 06/06/16 demonstrated normal LVEF at 55-60%, moderate LVH. No WMA. There was aortic sclerosis w/o stenosis. Trivial MR was noted. Compared to his prior echo 11/2015, his EF was improved (previously 45-50%). He is on Eliquis for a/c given chronic atrial fibrillation.   06/25/17 - the last seen in 08/2016 as preop for a TKR. He is coming after 4 months, he states that he feels great, his most recent knee replacement went much easier than previous. He has mild chronic lower extremity edema and no claudications, in fact he is able to bike 10 miles yesterday without any symptoms. He denies any shortness of breath or chest pain. He is compliant with his medications. His only concern is dizziness that has been diagnosed as vertigo and he is seeing ENT for that and using the meclizine. He also states that he feels dizzy at home sometimes with positional changes but sometimes for no reason but no falls. Occasional palpitations.  10/03/2017 - 3 months follow up, he feels well, denies chest pain, SOB, no palpitations, improved dizziness or no falls. Complaint with medications. No bleeding. Started on Flexeril by neurology for vertigo. He complains of LE numbness and claudications.   Past Medical History:  Diagnosis Date  . AF (atrial fibrillation) ([place])   . Allergic rhinitis, cause unspecified   . Arthritis    OA  . Asthma   . Chronic kidney disease    AT 54 PERCENMT MANAGED BY DR SHAW  . Hypertension   . Hypothyroidism   . OSA on CPAP   . PONV (postoperative nausea and vomiting)     WITH GENERAL PREFERS SPINAL  . 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 Sore on leg    RIGHT LOWEER LEG HEALING X 3 WEEKS  . Type II or unspecified type diabetes mellitus without mention of complication, not stated as uncontrolled    TYPE 2  . Vertigo    LAST YEAR     Past Surgical History:  Procedure Laterality Date  . CARDIAC CATHETERIZATION    . EYE SURGERY Bilateral    BOTH EYES IOC LENS REPLACEMENT CATARACT  . JOINT REPLACEMENT Right 10/2010   KNEE TOTAL JOINT   . KNEE ARTHROSCOPY Right   . LEFT HAND SURGERY  1980   TENDON REPAIR  . TONSILLECTOMY      Current Medications: Current Meds  Medication Sig  . Alpha-D-Galactosidase (BEANO) TABS Take 1 tablet by mouth as needed (FOR GAS).   [name] amLODipine (NORVASC) 5 MG tablet Take 1 tablet (5 mg total) by mouth daily.  . ANDROGEL PUMP 20.25 MG/ACT (1.62%) GEL 4 pumps daily  . atorvastatin (LIPITOR) 80 MG tablet Take 80 mg by mouth at bedtime.   [name] azelastine (ASTELIN) 137 MCG/SPRAY nasal spray Place 1 spray into the nose daily as needed for rhinitis. Use in each nostril as directed   . budesonide-formoterol (SYMBICORT) 160-4.5 MCG/ACT inhaler Inhale 2 puffs into the  lungs 2 (two) times daily.  . Coenzyme Q10 (COQ10) 100 MG CAPS Take 100 mg by mouth daily.   . colchicine 0.6 MG tablet Take 0.6 mg by mouth daily as needed (GOUT).   [name] desonide (DESOWEN) 0.05 % cream Apply 1 application topically daily as needed (SKIN IRRITATION).   [name] ELIQUIS 5 MG TABS tablet Take 1 tablet by mouth 2 (two) times daily.  . fluticasone (FLONASE) 50 MCG/ACT nasal spray Place 2 sprays into both nostrils daily as needed for allergies.  . fluticasone furoate-vilanterol (BREO ELLIPTA) 100-25 MCG/INH AEPB Inhale 1 puff into the lungs daily.  . furosemide (LASIX) 20 MG tablet Take 20 mg by mouth daily.    . hydrochlorothiazide (HYDRODIURIL) 25 MG tablet Take 25 mg by mouth daily.  . hydrocortisone (ANUSOL-HC) 2.5 %  rectal cream Place 1 application rectally 2 (two) times daily as needed for hemorrhoids.   . Insulin Degludec (TRESIBA FLEXTOUCH [place]) Inject 20 Units into the skin at bedtime.  [name] JARDIANCE 10 MG TABS tablet Take 10 mg by mouth daily.  [name] levothyroxine (SYNTHROID, LEVOTHROID) 50 MCG tablet Take 50 mcg by mouth daily.    . Loratadine (CLARITIN) 10 MG CAPS Take 1 capsule by mouth at bedtime.   [name] losartan (COZAAR) 50 MG tablet Take 1 tablet (50 mg total) by mouth 2 (two) times daily.  [name] LUMIGAN 0.01 % SOLN Place 1 drop into both eyes at bedtime.  . meclizine (ANTIVERT) 25 MG tablet Take 25 mg by mouth 3 (three) times daily as needed.  . metaxalone (SKELAXIN) 800 MG tablet Take 1 tablet (800 mg total) by mouth 3 (three) times daily as needed for muscle spasms.  . metFORMIN (GLUCOPHAGE) 500 MG tablet Take 1,000 mg by mouth 2 (two) times daily with a meal.   . metoprolol succinate (TOPROL-XL) 50 MG 24 hr tablet Take one and one-half tablets by mouth in the morning and one tablet by mouth in the evening.  . montelukast (SINGULAIR) 10 MG tablet Take 20 mg by mouth at bedtime.   . OMEGA-3 KRILL OIL 300 MG CAPS Take 1 capsule by mouth daily.    . ondansetron (ZOFRAN) 4 MG tablet Take 1 tablet (4 mg total) by mouth every 8 (eight) hours as needed for nausea or vomiting.  [name] OZEMPIC 1 MG/DOSE SOPN Inject 1 mg once a week as directed.   . Pancrelipase, Lip-Prot-Amyl, (CREON) 24000 UNITS CPEP Take 24,000-48,000 Units by mouth 3 (three) times daily as needed (digestion).   . pantoprazole (PROTONIX) 40 MG tablet Take 40 mg daily by mouth.   . potassium chloride SA (K-DUR,KLOR-CON) 20 MEQ tablet Take 20 mEq by mouth daily.    . promethazine (PHENERGAN) 12.5 MG tablet Take 1-2 tablets (12.5-25 mg total) by mouth every 6 (six) hours as needed for nausea.  . Psyllium (HYDROCIL PO) Take 30 grams in 8 oz of water daily  . Salicylic Acid-Cleanser 6 % (LOTION) KIT Apply topically and occlude area at night (Patient taking  differently: Apply topically and occlude area at night as needed for dryness)  . Simethicone (GAS-X PO) Take 180 mg by mouth as needed (gas).   [name] Spacer/Aero-Holding Chambers (AEROCHAMBER MV) inhaler Use as instructed  . traMADol (ULTRAM) 50 MG tablet Take 1-2 tablets (50-100 mg total) by mouth every 6 (six) hours as needed for moderate pain.  [name] venlafaxine XR (EFFEXOR-XR) 37.5 MG 24 hr capsule Take 37.5 mg at bedtime by mouth.      Allergies:   Patient  has no known allergies.   Social History   Socioeconomic History  . Marital status: Married    Spouse name: Not on file  . Number of children: Not on file  . Years of education: Not on file  . Highest education level: Not on file  Social Needs  . Financial resource strain: Not on file  . Food insecurity - worry: Not on file  . Food insecurity - inability: Not on file  . Transportation needs - medical: Not on file  . Transportation needs - non-medical: Not on file  Occupational History  . Occupation: Retired    Comment: Scientist, research (life sciences)  Tobacco Use  . Smoking status: Former Smoker    Packs/day: 1.00    Years: 26.00    Pack years: 26.00    Types: Cigarettes    Last attempt to quit: 11/20/1982    Years since quitting: 34.8  . Smokeless tobacco: Never Used  Substance and Sexual Activity  . Alcohol use: Yes    Comment: SOCIAL  . Drug use: No  . Sexual activity: Not on file  Other Topics Concern  . Not on file  Social History Narrative  . Not on file     Family History: The patient's family history includes Breast cancer in his mother; Emphysema in his father; Heart disease in his mother; Hypertension in his sister. ROS:   Please see the history of present illness.    All other systems reviewed and are negative.  EKGs/Labs/Other Studies Reviewed:    The following studies were reviewed today:  EKG:  EKG is not ordered today.   Recent Labs: 11/01/2016: ALT 16 11/08/2016: BUN 24; Creatinine, Ser 1.57; Hemoglobin  13.7; Platelets 195; Potassium 3.6; Sodium 138  Recent Lipid Panel No results found for: CHOL, TRIG, HDL, CHOLHDL, VLDL, LDLCALC, LDLDIRECT  Physical Exam:    VS:  There were no vitals taken for this visit.    Wt Readings from Last 3 Encounters:  09/07/17 229 lb (103.9 kg)  06/25/17 230 lb (104.3 kg)  03/08/17 218 lb (98.9 kg)    GEN:  Well nourished, well developed in no acute distress HEENT: Normal NECK: No JVD; No carotid bruits LYMPHATICS: No lymphadenopathy CARDIAC: RRR, no murmurs, rubs, gallops RESPIRATORY:  Clear to auscultation without rales, wheezing or rhonchi  ABDOMEN: Soft, non-tender, non-distended MUSCULOSKELETAL:  No edema; chronic venous stasis changes on B/L LE  SKIN: Warm and dry NEUROLOGIC:  Alert and oriented x 3 PSYCHIATRIC:  Normal affect    ASSESSMENT:    1. Chronic atrial fibrillation ([place])   2. Chronic systolic CHF (congestive heart failure) ([place])   3. Essential hypertension   4. Claudication [facility])    PLAN:    In order of problems listed above:  1. Chronic Atrial Fibrillation: rate controlled, no bleeding.  2. Chronic Diastolic CHF: most recent echo 05/2016 showed an EF of 55-60%. He is euvolemic on exam and asymptomatic.  3. HTN: well controlled.  4. DM: followed by PCP. Most recent HbA1c 6%.  5. OSA: continue with CPAP nightly.   6. Hyperlipidemia - most recent lipids in 7/208 LDL 46, TG 72, HDL 29.  7. CKD stage 3 - Baseline Crea 1.3 - 1.5.  8. Claudications - fairly good pulses on PT and DP B/L. We will obtain B/L arterial Duplex.  Medication Adjustments/Labs and Tests Ordered: Current medicines are reviewed at length with the patient today.  Concerns regarding medicines are outlined above.  Orders Placed This Encounter  Procedures  .  Comp Met (CMET)  . CBC w/Diff  . TSH   No orders of the defined types were placed in this encounter.   Signed, Ena Dawley, MD  10/03/2017 2:19 PM    [facility]

## 2017-10-04 ENCOUNTER — Other Ambulatory Visit: Payer: Self-pay | Admitting: Cardiology

## 2017-10-04 DIAGNOSIS — I739 Peripheral vascular disease, unspecified: Secondary | ICD-10-CM

## 2017-10-04 LAB — CBC WITH DIFFERENTIAL/PLATELET
Basophils Absolute: 0.1 10*3/uL (ref 0.0–0.2)
Basos: 1 %
EOS (ABSOLUTE): 0.4 10*3/uL (ref 0.0–0.4)
Eos: 4 %
Hematocrit: 48.3 % (ref 37.5–51.0)
Hemoglobin: 15.4 g/dL (ref 13.0–17.7)
Immature Grans (Abs): 0 10*3/uL (ref 0.0–0.1)
Immature Granulocytes: 0 %
Lymphocytes Absolute: 2.6 10*3/uL (ref 0.7–3.1)
Lymphs: 27 %
MCH: 26 pg — ABNORMAL LOW (ref 26.6–33.0)
MCHC: 31.9 g/dL (ref 31.5–35.7)
MCV: 82 fL (ref 79–97)
Monocytes Absolute: 0.6 10*3/uL (ref 0.1–0.9)
Monocytes: 6 %
Neutrophils Absolute: 5.9 10*3/uL (ref 1.4–7.0)
Neutrophils: 62 %
Platelets: 205 10*3/uL (ref 150–379)
RBC: 5.93 x10E6/uL — ABNORMAL HIGH (ref 4.14–5.80)
RDW: 18.6 % — ABNORMAL HIGH (ref 12.3–15.4)
WBC: 9.5 10*3/uL (ref 3.4–10.8)

## 2017-10-04 LAB — COMPREHENSIVE METABOLIC PANEL
ALT: 18 IU/L (ref 0–44)
AST: 18 IU/L (ref 0–40)
Albumin/Globulin Ratio: 1.6 (ref 1.2–2.2)
Albumin: 4.2 g/dL (ref 3.5–4.8)
Alkaline Phosphatase: 92 IU/L (ref 39–117)
BUN/Creatinine Ratio: 11 (ref 10–24)
BUN: 13 mg/dL (ref 8–27)
Bilirubin Total: 0.6 mg/dL (ref 0.0–1.2)
CO2: 26 mmol/L (ref 20–29)
Calcium: 9.7 mg/dL (ref 8.6–10.2)
Chloride: 100 mmol/L (ref 96–106)
Creatinine, Ser: 1.22 mg/dL (ref 0.76–1.27)
GFR calc Af Amer: 65 mL/min/{1.73_m2} (ref >59)
GFR calc non Af Amer: 56 mL/min/{1.73_m2} — ABNORMAL LOW (ref >59)
Globulin, Total: 2.6 g/dL (ref 1.5–4.5)
Glucose: 102 mg/dL — ABNORMAL HIGH (ref 65–99)
Potassium: 3.7 mmol/L (ref 3.5–5.2)
Sodium: 143 mmol/L (ref 134–144)
Total Protein: 6.8 g/dL (ref 6.0–8.5)

## 2017-10-04 LAB — TSH: TSH: 2.05 u[IU]/mL (ref 0.450–4.500)

## 2017-10-23 ENCOUNTER — Ambulatory Visit (HOSPITAL_COMMUNITY)
Admission: RE | Admit: 2017-10-23 | Discharge: 2017-10-23 | Disposition: A | Discharge status: Home / Self Care | Payer: Medicare Other | Source: Ambulatory Visit | Attending: Cardiology | Admitting: Cardiology

## 2017-10-23 DIAGNOSIS — E785 Hyperlipidemia, unspecified: Secondary | ICD-10-CM | POA: Insufficient documentation

## 2017-10-23 DIAGNOSIS — I2510 Atherosclerotic heart disease of native coronary artery without angina pectoris: Secondary | ICD-10-CM | POA: Diagnosis not present

## 2017-10-23 DIAGNOSIS — E1151 Type 2 diabetes mellitus with diabetic peripheral angiopathy without gangrene: Secondary | ICD-10-CM | POA: Diagnosis not present

## 2017-10-23 DIAGNOSIS — I10 Essential (primary) hypertension: Secondary | ICD-10-CM | POA: Insufficient documentation

## 2017-10-23 DIAGNOSIS — I739 Peripheral vascular disease, unspecified: Secondary | ICD-10-CM | POA: Diagnosis not present

## 2017-12-03 DIAGNOSIS — H40053 Ocular hypertension, bilateral: Secondary | ICD-10-CM | POA: Diagnosis not present

## 2017-12-03 DIAGNOSIS — H401232 Low-tension glaucoma, bilateral, moderate stage: Secondary | ICD-10-CM | POA: Diagnosis not present

## 2017-12-03 DIAGNOSIS — H26491 Other secondary cataract, right eye: Secondary | ICD-10-CM | POA: Diagnosis not present

## 2017-12-03 DIAGNOSIS — E119 Type 2 diabetes mellitus without complications: Secondary | ICD-10-CM | POA: Diagnosis not present

## 2017-12-17 DIAGNOSIS — D1801 Hemangioma of skin and subcutaneous tissue: Secondary | ICD-10-CM | POA: Diagnosis not present

## 2017-12-17 DIAGNOSIS — L821 Other seborrheic keratosis: Secondary | ICD-10-CM | POA: Diagnosis not present

## 2017-12-17 DIAGNOSIS — L814 Other melanin hyperpigmentation: Secondary | ICD-10-CM | POA: Diagnosis not present

## 2017-12-17 DIAGNOSIS — Z85828 Personal history of other malignant neoplasm of skin: Secondary | ICD-10-CM | POA: Diagnosis not present

## 2017-12-17 DIAGNOSIS — L84 Corns and callosities: Secondary | ICD-10-CM | POA: Diagnosis not present

## 2017-12-18 DIAGNOSIS — M150 Primary generalized (osteo)arthritis: Secondary | ICD-10-CM | POA: Diagnosis not present

## 2017-12-18 DIAGNOSIS — N183 Chronic kidney disease, stage 3 (moderate): Secondary | ICD-10-CM | POA: Diagnosis not present

## 2017-12-18 DIAGNOSIS — Z6833 Body mass index (BMI) 33.0-33.9, adult: Secondary | ICD-10-CM | POA: Diagnosis not present

## 2017-12-18 DIAGNOSIS — E669 Obesity, unspecified: Secondary | ICD-10-CM | POA: Diagnosis not present

## 2017-12-18 DIAGNOSIS — M1A09X0 Idiopathic chronic gout, multiple sites, without tophus (tophi): Secondary | ICD-10-CM | POA: Diagnosis not present

## 2017-12-28 ENCOUNTER — Ambulatory Visit (INDEPENDENT_AMBULATORY_CARE_PROVIDER_SITE_OTHER): Payer: Medicare Other | Admitting: Neurology

## 2017-12-28 ENCOUNTER — Encounter: Payer: Self-pay | Admitting: Neurology

## 2017-12-28 VITALS — BP 122/64 | HR 87 | Ht 69.0 in | Wt 224.0 lb

## 2017-12-28 DIAGNOSIS — R42 Dizziness and giddiness: Secondary | ICD-10-CM

## 2017-12-28 MED ORDER — VENLAFAXINE HCL ER 75 MG PO CP24: 75.0000 mg | ORAL_CAPSULE | Freq: Every day | ORAL | 0 refills | Status: DC

## 2017-12-28 NOTE — Patient Instructions (Signed)
1.  Continue venlafaxine XR 75mg  daily 2.  Follow up in 3 months.

## 2017-12-28 NOTE — Progress Notes (Signed)
NEUROLOGY FOLLOW UP OFFICE NOTE  IMRAN NUON [id number]  HISTORY OF PRESENT ILLNESS: Colton Brooks is a 77 year old right-handed male with chronic CHF, atrial fibrillation, dilated cardiomyopathy, hypertension and diabetes who follows up for vertigo.  He is accompanied by his wife who supplements history.   UPDATE: In October, he was started on venlafaxine XR 72m daily to see if it helped with his symptoms.  At first he developed drowsiness.  Dose was reduced to 37.538mand he started taking it at night.  6 weeks ago, he increased dose back to 7551m Episodes of vertigo are more frequent but less severe and duration is reduced to just 5 or 10 seconds.  It is much more manageable.  He is also seeing a chiropractor and receiving acupuncture, which helps.   HISTORY: In May 2017, he developed vertigo.  [facility]  In addition, he developed a rash and pruritis.  Symptoms improved after discontinuation of the medication, but never resolved.  He has it daily.  He describes it as an acute onset spinning sensation.  It usually positional, such as turning over in bed or gazing up or down, but could be spontaneous as well.  Turning to the right is worse than to the left.  When he wakes up in bed in the morning, he feels dizzy.  He staggers when he walks.  Spells typically last a few seconds but on some occasions last up to 30 minutes.  There is associated nausea and he would likely vomit if not for an antiemetic.  He reports some hearing loss in both ears, as well as tinnitus.  He denies associated lightheadedness, headache, double vision, unilateral facial or extremity numbness or weakness.     He has tried several therapies.  Meclizine and acupuncture provide mild relief.  He also sees a chiRestaurant manager, fast foodVestibular rehab was ineffective.   He was evaluated by ENT and underwent audiogram/ENG, which reportedly did not demonstrate  peripheral etiology.  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  MRA of head showed no significant proximal or large intracranial vessel stenosis or occlusion.  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     He reports one prior episode of vertigo 10 years ago, a reaction to a cold medicine with codeine.  He has atrial fibrillation and reports daily episodes of racing heartbeat lasting just seconds..  Marland KitchenAST MEDICAL HISTORY: Past Medical History:  Diagnosis Date  . AF (atrial fibrillation) ([place] . Allergic rhinitis, cause unspecified   . Arthritis    OA  . Asthma   . Chronic kidney disease    AT 47 51RCENMT MANAGED BY DR SHAW  . Hypertension   . Hypothyroidism   . OSA on CPAP   . PONV (postoperative nausea and vomiting)    WITH GENERAL PREFERS SPINAL  . 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 Sore on leg    RIGHT LOWEER LEG HEALING X 3 WEEKS  . Type II or unspecified type diabetes mellitus without mention of complication, not stated as uncontrolled    TYPE 2  . Vertigo    LAST YEAR     MEDICATIONS: Current Outpatient Medications  on File Prior to Visit  Medication Sig Dispense Refill  . Alpha-D-Galactosidase (BEANO) TABS Take 1 tablet by mouth as needed (FOR GAS).     [name] amLODipine (NORVASC) 5 MG tablet Take 1 tablet (5 mg total) by mouth daily. 180 tablet 1  . ANDROGEL PUMP 20.25 MG/ACT (1.62%) GEL 4 pumps daily    . atorvastatin (LIPITOR) 80 MG tablet Take 80 mg by mouth at bedtime.     [name] azelastine (ASTELIN) 137 MCG/SPRAY nasal spray Place 1 spray into the nose daily as needed for rhinitis. Use in each nostril as directed     . budesonide-formoterol (SYMBICORT) 160-4.5 MCG/ACT inhaler Inhale 2 puffs into the lungs 2 (two) times daily. 2 Inhaler 0  . Coenzyme  Q10 (COQ10) 100 MG CAPS Take 100 mg by mouth daily.     . colchicine 0.6 MG tablet Take 0.6 mg by mouth daily as needed (GOUT).     [name] desonide (DESOWEN) 0.05 % cream Apply 1 application topically daily as needed (SKIN IRRITATION).     [name] ELIQUIS 5 MG TABS tablet Take 1 tablet by mouth 2 (two) times daily.    . fluticasone (FLONASE) 50 MCG/ACT nasal spray Place 2 sprays into both nostrils daily as needed for allergies. 48 g 3  . fluticasone furoate-vilanterol (BREO ELLIPTA) 100-25 MCG/INH AEPB Inhale 1 puff into the lungs daily. 1 each 3  . furosemide (LASIX) 20 MG tablet Take 20 mg by mouth daily.      . hydrochlorothiazide (HYDRODIURIL) 25 MG tablet Take 25 mg by mouth daily.    . hydrocortisone (ANUSOL-HC) 2.5 % rectal cream Place 1 application rectally 2 (two) times daily as needed for hemorrhoids.     . Insulin Degludec (TRESIBA FLEXTOUCH [place]) Inject 20 Units into the skin at bedtime.    [name] JARDIANCE 10 MG TABS tablet Take 10 mg by mouth daily.    [name] levothyroxine (SYNTHROID, LEVOTHROID) 50 MCG tablet Take 50 mcg by mouth daily.      . Loratadine (CLARITIN) 10 MG CAPS Take 1 capsule by mouth at bedtime.     [name] losartan (COZAAR) 50 MG tablet Take 1 tablet (50 mg total) by mouth 2 (two) times daily. 180 tablet 3  . LUMIGAN 0.01 % SOLN Place 1 drop into both eyes at bedtime.    . meclizine (ANTIVERT) 25 MG tablet Take 25 mg by mouth 3 (three) times daily as needed.    . metaxalone (SKELAXIN) 800 MG tablet Take 1 tablet (800 mg total) by mouth 3 (three) times daily as needed for muscle spasms. 90 tablet 0  . metFORMIN (GLUCOPHAGE) 500 MG tablet Take 1,000 mg by mouth 2 (two) times daily with a meal.     . metoprolol succinate (TOPROL-XL) 50 MG 24 hr tablet Take one and one-half tablets by mouth in the morning and one tablet by mouth in the evening. 90 tablet 3  . montelukast (SINGULAIR) 10 MG tablet Take 20 mg by mouth at bedtime.     . OMEGA-3 KRILL OIL 300 MG CAPS Take 1 capsule by mouth daily.        . ondansetron (ZOFRAN) 4 MG tablet Take 1 tablet (4 mg total) by mouth every 8 (eight) hours as needed for nausea or vomiting. 20 tablet 2  . OZEMPIC 1 MG/DOSE SOPN Inject 1 mg once a week as directed.     . Pancrelipase, Lip-Prot-Amyl, (CREON) 24000 UNITS CPEP Take 24,000-48,000 Units by mouth 3 (three) times daily as needed (digestion).     [name]  pantoprazole (PROTONIX) 40 MG tablet Take 40 mg daily by mouth.     . potassium chloride SA (K-DUR,KLOR-CON) 20 MEQ tablet Take 20 mEq by mouth daily.      . promethazine (PHENERGAN) 12.5 MG tablet Take 1-2 tablets (12.5-25 mg total) by mouth every 6 (six) hours as needed for nausea. 40 tablet 0  . Psyllium (HYDROCIL PO) Take 30 grams in 8 oz of water daily    . Salicylic Acid-Cleanser 6 % (LOTION) KIT Apply topically and occlude area at night (Patient taking differently: Apply topically and occlude area at night as needed for dryness) 1 kit prn  . Simethicone (GAS-X PO) Take 180 mg by mouth as needed (gas).     [name] Spacer/Aero-Holding Chambers (AEROCHAMBER MV) inhaler Use as instructed 1 each 0  . traMADol (ULTRAM) 50 MG tablet Take 1-2 tablets (50-100 mg total) by mouth every 6 (six) hours as needed for moderate pain. (Patient not taking: Reported on 12/28/2017) 80 tablet 1   No current facility-administered medications on file prior to visit.     ALLERGIES: No Known Allergies  FAMILY HISTORY: Family History  Problem Relation Age of Onset  . Heart disease Mother        died at 16 resp failure  . Breast cancer Mother   . Emphysema Father        died at 53 resp failure  . Hypertension Sister     SOCIAL HISTORY: Social History   Socioeconomic History  . Marital status: Married    Spouse name: Not on file  . Number of children: Not on file  . Years of education: Not on file  . Highest education level: Not on file  Social Needs  . Financial resource strain: Not on file  . Food insecurity - worry: Not on file  . Food insecurity - inability:  Not on file  . Transportation needs - medical: Not on file  . Transportation needs - non-medical: Not on file  Occupational History  . Occupation: Retired    Comment: Scientist, research (life sciences)  Tobacco Use  . Smoking status: Former Smoker    Packs/day: 1.00    Years: 26.00    Pack years: 26.00    Types: Cigarettes    Last attempt to quit: 11/20/1982    Years since quitting: 35.1  . Smokeless tobacco: Never Used  Substance and Sexual Activity  . Alcohol use: Yes    Comment: SOCIAL  . Drug use: No  . Sexual activity: Not on file  Other Topics Concern  . Not on file  Social History Narrative  . Not on file    REVIEW OF SYSTEMS: Constitutional: No fevers, chills, or sweats, no generalized fatigue, change in appetite Eyes: No visual changes, double vision, eye pain Ear, nose and throat: No hearing loss, ear pain, nasal congestion, sore throat Cardiovascular: No chest pain, palpitations Respiratory:  No shortness of breath at rest or with exertion, wheezes GastrointestinaI: No nausea, vomiting, diarrhea, abdominal pain, fecal incontinence Genitourinary:  No dysuria, urinary retention or frequency Musculoskeletal:  No neck pain, back pain Integumentary: No rash, pruritus, skin lesions Neurological: as above Psychiatric: No depression, insomnia, anxiety Endocrine: No palpitations, fatigue, diaphoresis, mood swings, change in appetite, change in weight, increased thirst Hematologic/Lymphatic:  No purpura, petechiae. Allergic/Immunologic: no itchy/runny eyes, nasal congestion, recent allergic reactions, rashes  PHYSICAL EXAM: Vitals:   12/28/17 1420  BP: 122/64  Pulse: 87  SpO2: 95%   General: No acute distress.  Patient appears well-groomed.  IMPRESSION: Vertigo/dizziness.  No specific etiology.  Improved  PLAN: Continue venlafaxine XR 26m daily for now Follow up in 3 months.  If doing well, consider tapering off.  15 minutes spent face to face with patient, 100% spent  discussing management.  AMetta Clines DO  CC:  WMarton Redwood MD

## 2018-02-14 DIAGNOSIS — I10 Essential (primary) hypertension: Secondary | ICD-10-CM | POA: Diagnosis not present

## 2018-02-14 DIAGNOSIS — I5022 Chronic systolic (congestive) heart failure: Secondary | ICD-10-CM | POA: Diagnosis not present

## 2018-02-14 DIAGNOSIS — Z6834 Body mass index (BMI) 34.0-34.9, adult: Secondary | ICD-10-CM | POA: Diagnosis not present

## 2018-02-14 DIAGNOSIS — M1990 Unspecified osteoarthritis, unspecified site: Secondary | ICD-10-CM | POA: Diagnosis not present

## 2018-02-14 DIAGNOSIS — F325 Major depressive disorder, single episode, in full remission: Secondary | ICD-10-CM | POA: Diagnosis not present

## 2018-02-14 DIAGNOSIS — J45909 Unspecified asthma, uncomplicated: Secondary | ICD-10-CM | POA: Diagnosis not present

## 2018-02-14 DIAGNOSIS — E1129 Type 2 diabetes mellitus with other diabetic kidney complication: Secondary | ICD-10-CM | POA: Diagnosis not present

## 2018-02-14 DIAGNOSIS — I480 Paroxysmal atrial fibrillation: Secondary | ICD-10-CM | POA: Diagnosis not present

## 2018-02-14 DIAGNOSIS — M1000 Idiopathic gout, unspecified site: Secondary | ICD-10-CM | POA: Diagnosis not present

## 2018-02-14 DIAGNOSIS — E7849 Other hyperlipidemia: Secondary | ICD-10-CM | POA: Diagnosis not present

## 2018-02-14 DIAGNOSIS — N183 Chronic kidney disease, stage 3 (moderate): Secondary | ICD-10-CM | POA: Diagnosis not present

## 2018-02-14 DIAGNOSIS — G4733 Obstructive sleep apnea (adult) (pediatric): Secondary | ICD-10-CM | POA: Diagnosis not present

## 2018-02-16 IMAGING — US US ABDOMEN LIMITED
1 series · 14 of 25 positions shown · non-contrast
Comparison: None.

CLINICAL DATA: Nausea.

EXAM:
US ABDOMEN LIMITED - RIGHT UPPER QUADRANT

[Series 1: us abdomen limited · 0.22mm/px · 14 of 51 slices shown]
[im 1/51]
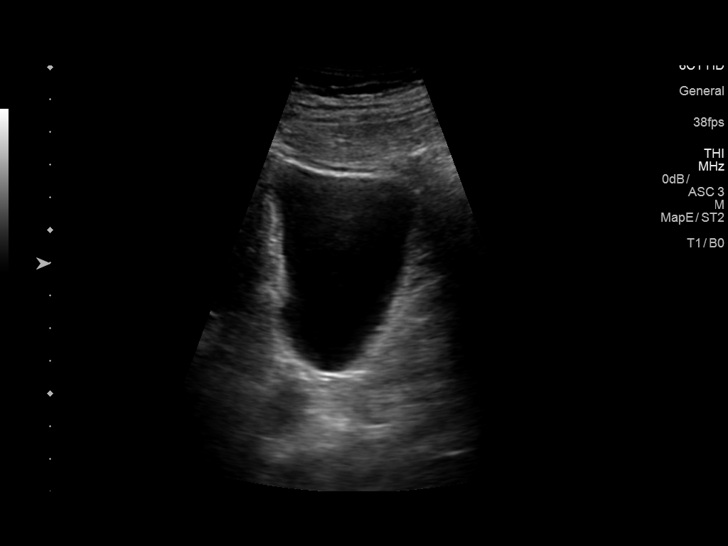
[im 5/51]
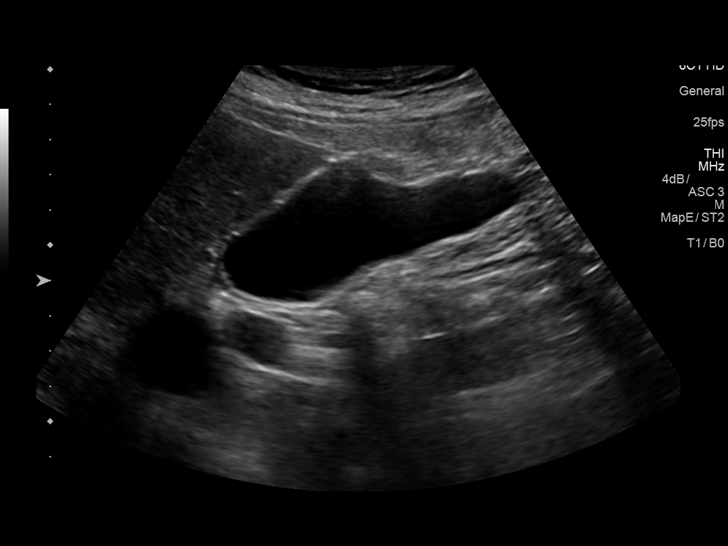
[im 9/51]
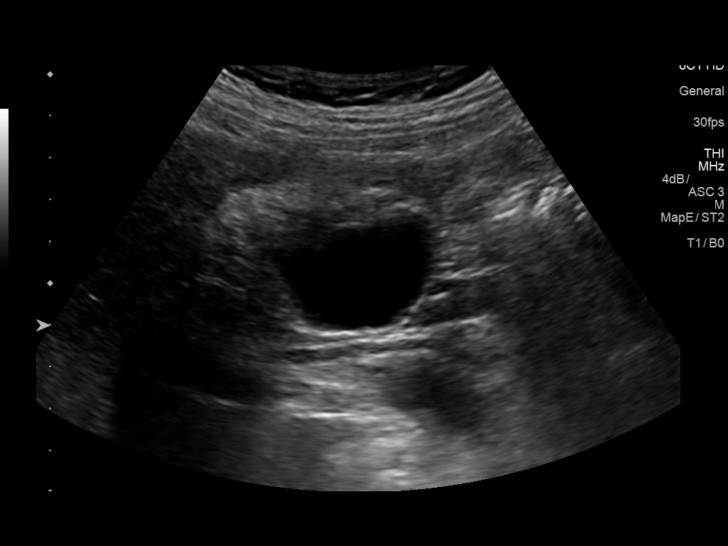
[im 13/51]
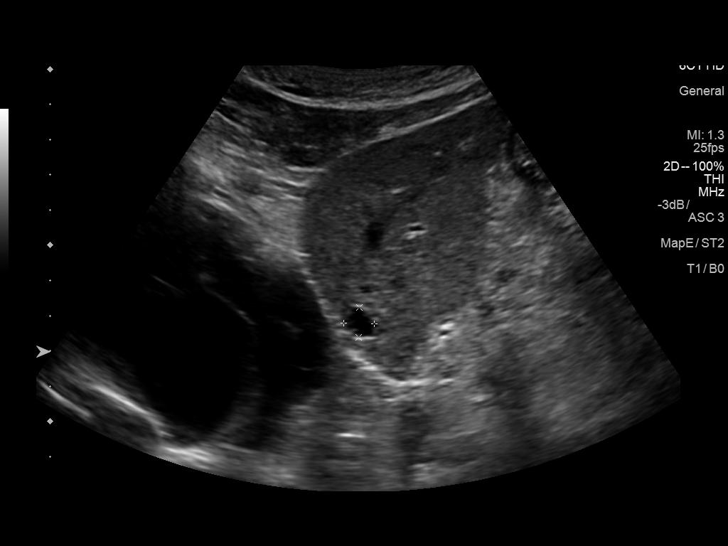
[im 17/51]
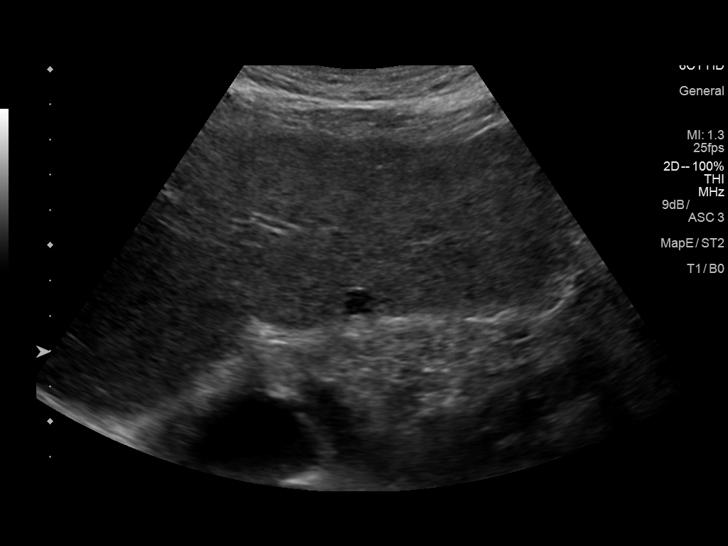
[im 19/51]
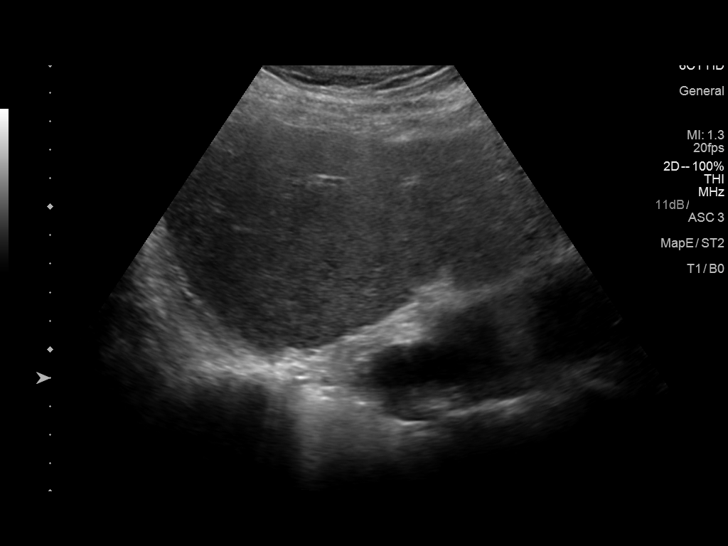
[im 23/51]
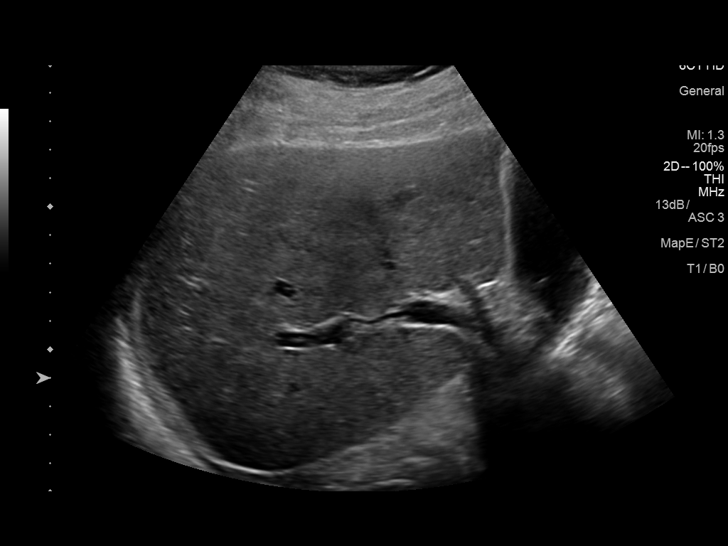
[im 28/51]
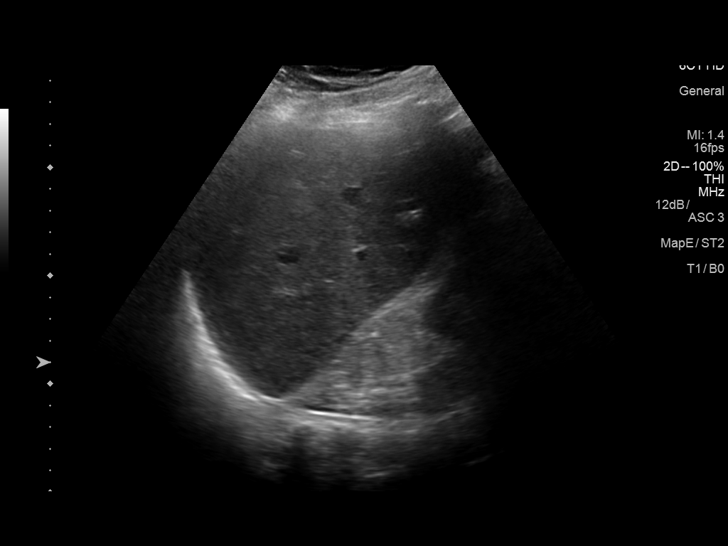
[im 32/51]
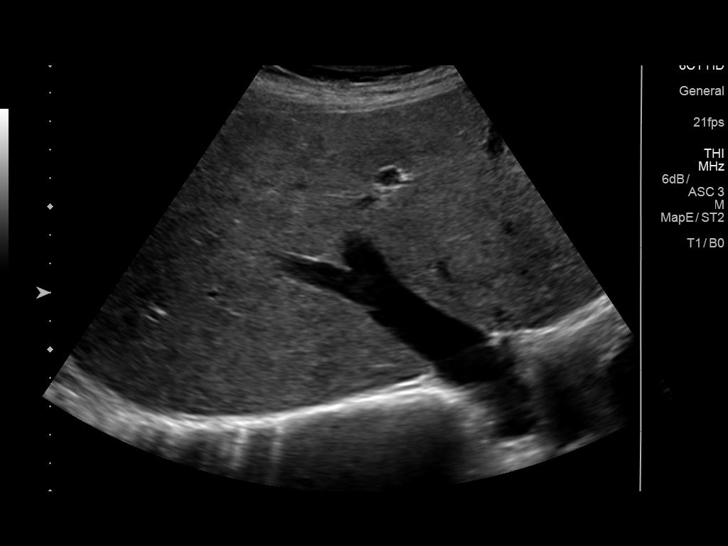
[im 34/51]
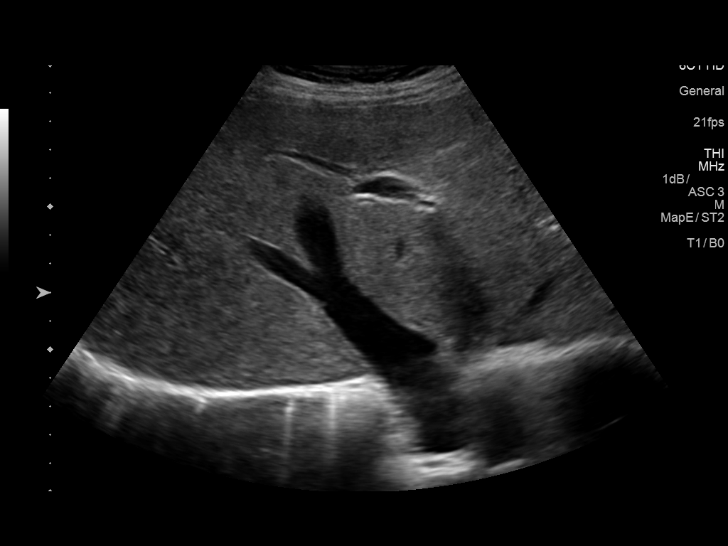
[im 38/51]
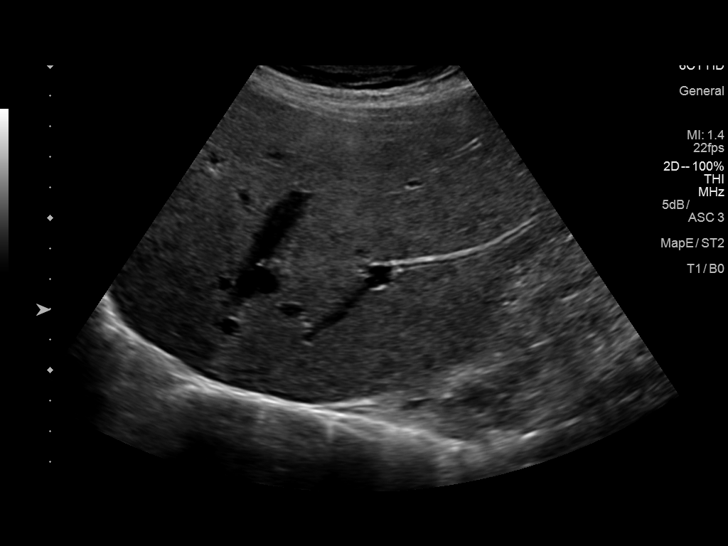
[im 42/51]
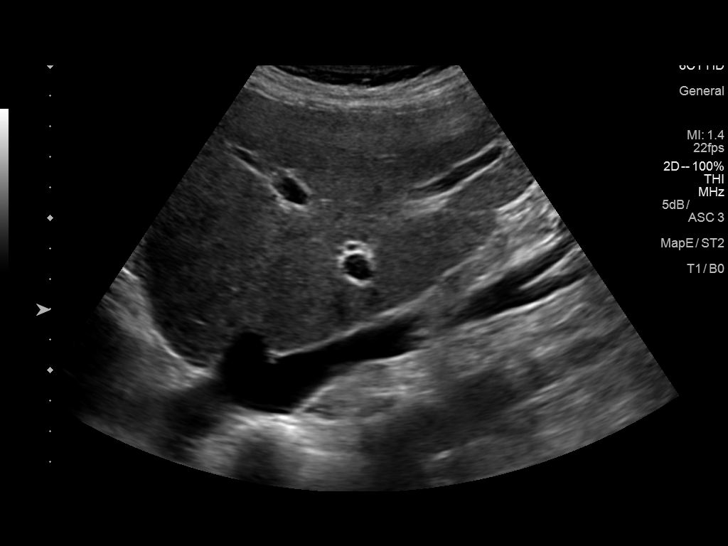
[im 46/51]
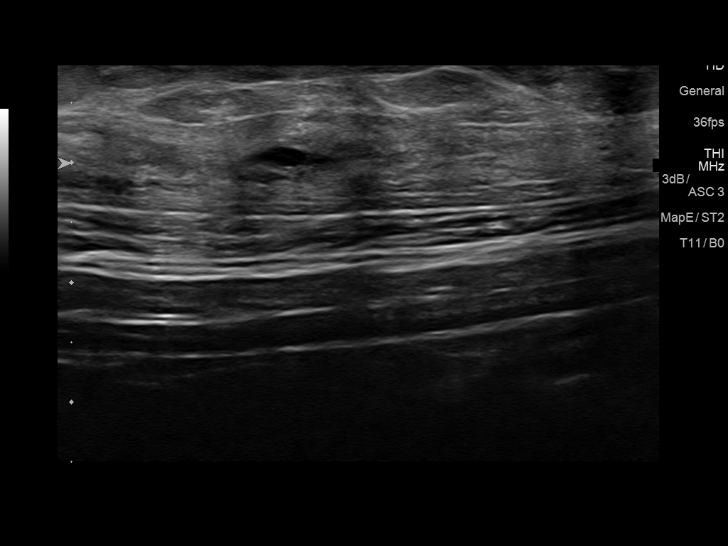
[im 51/51]
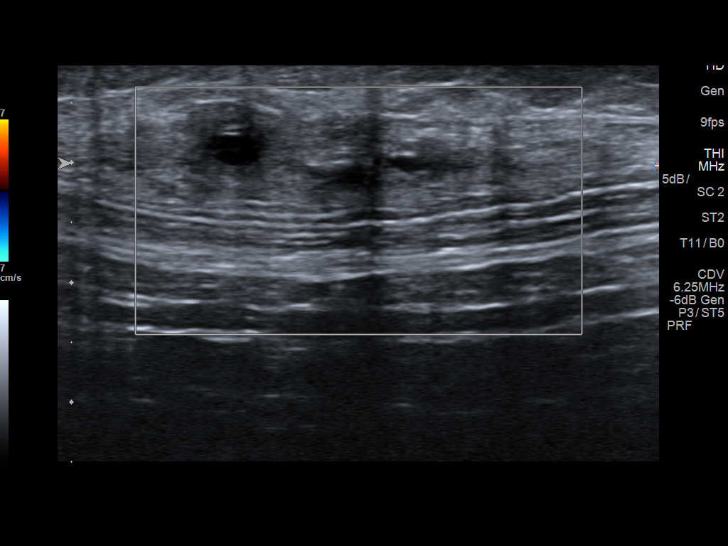

[14 of 25 positions shown; findings below may reference images not displayed]

FINDINGS: Gallbladder:

No gallstones or wall thickening visualized. No sonographic Murphy
sign noted by sonographer.

Common bile duct:

Diameter: 3.2 mm

Liver:

9 mm simple cyst left hepatic lobe.  Liver echotexture normal.

Noted over the right mid abdomen is a small approximately 9 mm cyst
in the right abdominal wall. This corresponds with palpable
abnormality .
IMPRESSION: 1. Small 9 mm cyst in the right abdominal wall in the region of
palpable abnormality. This could represent a tiny seroma.

2. No acute abnormality otherwise noted .

## 2018-02-19 ENCOUNTER — Other Ambulatory Visit: Payer: Self-pay | Admitting: Internal Medicine

## 2018-03-07 ENCOUNTER — Encounter: Payer: Self-pay | Admitting: Internal Medicine

## 2018-03-07 ENCOUNTER — Ambulatory Visit (INDEPENDENT_AMBULATORY_CARE_PROVIDER_SITE_OTHER): Payer: Medicare Other | Admitting: Internal Medicine

## 2018-03-07 VITALS — BP 124/82 | HR 70 | Ht 69.0 in | Wt 220.0 lb

## 2018-03-07 DIAGNOSIS — J4540 Moderate persistent asthma, uncomplicated: Secondary | ICD-10-CM

## 2018-03-07 DIAGNOSIS — G4733 Obstructive sleep apnea (adult) (pediatric): Secondary | ICD-10-CM

## 2018-03-07 MED ORDER — BUDESONIDE-FORMOTEROL FUMARATE 160-4.5 MCG/ACT IN AERO: 2.0000 | INHALATION_SPRAY | Freq: Two times a day (BID) | RESPIRATORY_TRACT | 3 refills | Status: AC

## 2018-03-07 MED ORDER — ALBUTEROL SULFATE HFA 108 (90 BASE) MCG/ACT IN AERS: 2.0000 | INHALATION_SPRAY | Freq: Four times a day (QID) | RESPIRATORY_TRACT | 3 refills | Status: AC | PRN

## 2018-03-07 NOTE — Assessment & Plan Note (Signed)
Symbicort with spacer is working well for him.  Only occasional need for rescue inhaler. Plan-refill Symbicort and albuterol

## 2018-03-07 NOTE — Patient Instructions (Addendum)
Order- DME [facility]   Please replace old CPAP machine, changing to auto 10-20, mask of choice, humidifier supplies, AirView  Refill scripts sent for Symbicort and ProAir   Glad you are doing well.   Please call if we can help

## 2018-03-07 NOTE — Assessment & Plan Note (Signed)
Good compliance and control.  His machine is old.  He sleeps better using CPAP. Plan-replace old machine changing to auto 10-20

## 2018-03-07 NOTE — Progress Notes (Signed)
Subjective:    Patient ID: Colton Brooks, male    DOB: 06-04-1941, 77 y.o.   MRN: [id number]  HPI  M former smoker followed for dyspnea, OSA, allergic rhinitis, asthma, complicated by DM 2, chronic A. fib, gout Office Spirometry 11/26/14- Mild obstructive airways disease NPSG 09/20/06- AHI 53.8/ hr, desaturation to 80%, body weight 263 lbs ----------------------------------------------------------------------------------------------------------  03/08/17-25 year old M former smoker followed for dyspnea, OSA, allergic rhinitis, asthma, complicated by DM 2, chronic A. fib, gout CPAP 13/AHP Follows For: OSA, ok using CPAP, Breo is helping but voice gets raspy after use and would like to see if there is anything to help with that Breo causes less hoarseness than Advair. Complains of vertigo with for a year now, making him unsteady with risk of falls. Needs ENT referral and refill for nausea until he can be seen. Remains compliant with CPAP except that he traveled recently and forgot his CPAP cord until he got home. Definitely feels better with it. Control is good.  03/07/18- 18 year old M former smoker followed for dyspnea, OSA, allergic rhinitis, asthma, complicated by DM 2, chronic A. fib, gout CPAP 13/AHP>> today replace machine, auto 10-20 We had given  Symbicort 160 / spacer to see if less hoarseness than Breo ----OSA: DME: [facility] Pt Pt wears CPAP nightly and DL attached. Pt would like to discuss pressure increase. Vertigo problem was improved by Effexor/neurology-big improvement in quality of life. Symbicort with spacer worked very well to improve his hoarseness, compared with Breo. Asthma component is doing very well now with occasional need for rescue inhaler.  Asks for refills. CPAP doing quite well but machine is old and we would like to change to AutoPap.  Review of Systems-See HPI    + = positive Constitutional:   No-   weight loss, night sweats, fevers, chills, fatigue,  lassitude. HEENT:   No-  headaches, difficulty swallowing, tooth/dental problems, sore throat,       No-  sneezing, itching, ear ache,  +nasal congestion, post nasal drip,  CV:  No-   chest pain, orthopnea, PND, swelling in lower extremities, anasarca, dizziness, palpitations Resp: +shortness of breath with exertion.              No-   productive cough,  No non-productive cough,  No- coughing up of blood.              No-   change in color of mucus.  No- wheezing.   Skin: No-   rash or lesions. GI:  No-   heartburn, indigestion, abdominal pain, nausea, vomiting,  GU: No-   dysuria,  MS:  +Chronic joint pain or swelling.   Neuro-     nothing unusual Psych:  No- change in mood or affect. No depression or anxiety.  No memory loss.   Objective:   Physical Exam  General- Alert, Oriented, Affect-appropriate, Distress- none acute; overweight Skin- rash-none, lesions- none, excoriation- none Lymphadenopathy- none Head- atraumatic            Eyes- Gross vision intact, PERRLA, conjunctivae clear secretions            Ears- Hearing, canals-normal            Nose- Clear, no-Septal dev, mucus, polyps, erosion, perforation             Throat- Mallampati III-IV , mucosa clear , drainage- none, tonsils- atrophic,+ mild hoarseness Neck- flexible , trachea midline, no stridor , thyroid nl, carotid no bruit Chest - symmetrical excursion ,  unlabored           Heart/CV- +nearly regular pulse/ Atrial fib , no murmur , no gallop  , no rub, nl s1 s2                           - JVD- none , edema-+1, stasis changes-+, varices- none           Lung- clear to P&A, wheeze- none, cough- none , dullness-none, rub- none           Chest wall-  Abd-  Br/ Gen/ Rectal- Not done, not indicated Extrem- cyanosis- none, clubbing, none, atrophy- none, strength- nl, +cane Neuro- grossly intact to observation

## 2018-03-12 ENCOUNTER — Ambulatory Visit: Payer: Medicare Other | Admitting: Internal Medicine

## 2018-03-14 ENCOUNTER — Encounter: Payer: Self-pay | Admitting: Neurology

## 2018-03-20 ENCOUNTER — Other Ambulatory Visit: Payer: Self-pay | Admitting: Neurology

## 2018-03-26 ENCOUNTER — Encounter: Payer: Self-pay | Admitting: Cardiology

## 2018-04-16 ENCOUNTER — Ambulatory Visit (INDEPENDENT_AMBULATORY_CARE_PROVIDER_SITE_OTHER): Payer: Medicare Other | Admitting: Cardiology

## 2018-04-16 ENCOUNTER — Encounter: Payer: Self-pay | Admitting: Cardiology

## 2018-04-16 ENCOUNTER — Encounter: Payer: Self-pay | Admitting: Neurology

## 2018-04-16 ENCOUNTER — Ambulatory Visit (INDEPENDENT_AMBULATORY_CARE_PROVIDER_SITE_OTHER): Payer: Medicare Other | Admitting: Neurology

## 2018-04-16 VITALS — BP 138/68 | HR 56 | Ht 69.0 in | Wt 214.0 lb

## 2018-04-16 VITALS — BP 126/72 | HR 57 | Ht 69.0 in | Wt 218.2 lb

## 2018-04-16 DIAGNOSIS — I482 Chronic atrial fibrillation, unspecified: Secondary | ICD-10-CM

## 2018-04-16 DIAGNOSIS — R42 Dizziness and giddiness: Secondary | ICD-10-CM

## 2018-04-16 NOTE — Patient Instructions (Addendum)
1.  See if acupuncture helps.  If vertigo does not improve by end of week, then contact me.  I would ideally like to prescribe you a prednisone taper.  However, it may increase your blood sugars, so you would have to monitor your sugar closely under supervision of Dr. Brigitte Pulse 2.  Continue venlafaxine XR 75mg  daily for now 3.  Follow up in 3 months.

## 2018-04-16 NOTE — Progress Notes (Signed)
NEUROLOGY FOLLOW UP OFFICE NOTE  Colton Brooks [id number]  HISTORY OF PRESENT ILLNESS: Colton Brooks is a 77 year old right-handed male with chronic CHF, atrial fibrillation, dilated cardiomyopathy, hypertension and diabetes who follows up for vertigo.  He is accompanied by his wife who supplements history.   UPDATE: Episodes of vertigo continued to be brief, lasting just seconds and occurring daily but manageable.  However, he began experiencing persistent vertigo about 3 days ago.  Symptoms have decreased in intensity but still persistent.  He plans on seeing his acupuncturist and chiropractor later this week.  Meclizine helps.   HISTORY: In May 2017, he developed vertigo.  It was thought to be due to [facility], which he had been on for 6 months.  In addition, he developed a rash and pruritis.  Symptoms improved after discontinuation of the medication, but never resolved.  He has it daily.  He describes it as an acute onset spinning sensation.  It usually positional, such as turning over in bed or gazing up or down, but could be spontaneous as well.  Turning to the right is worse than to the left.  When he wakes up in bed in the morning, he feels dizzy.  He staggers when he walks.  Spells typically last a few seconds but on some occasions last up to 30 minutes.  There is associated nausea and he would likely vomit if not for an antiemetic.  He reports some hearing loss in both ears, as well as tinnitus.  He denies associated lightheadedness, headache, double vision, unilateral facial or extremity numbness or weakness.     He has tried several therapies.  Meclizine and acupuncture provide mild relief.  He also sees a Restaurant manager, fast food.  Vestibular rehab was ineffective.   He was evaluated by ENT and underwent audiogram/ENG, which reportedly did not demonstrate peripheral etiology.  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  MRA of head showed no significant proximal or large intracranial vessel stenosis or occlusion.  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     He reports one prior episode of vertigo 10 years ago, a reaction to a cold medicine with codeine.  He has atrial fibrillation and reports daily episodes of racing heartbeat lasting just seconds.[name]  PAST MEDICAL HISTORY: Past Medical History:  Diagnosis Date  . AF (atrial fibrillation) ([place])   . Allergic rhinitis, cause unspecified   . Arthritis    OA  . Asthma   . Chronic kidney disease    AT 72 PERCENMT MANAGED BY DR SHAW  . Hypertension   . Hypothyroidism   . OSA on CPAP   . PONV (postoperative nausea and vomiting)    WITH GENERAL PREFERS SPINAL  . 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 Sore on leg    RIGHT LOWEER LEG HEALING X 3 WEEKS  . Type II or unspecified type diabetes mellitus without mention of complication, not stated as uncontrolled    TYPE 2  . Vertigo    LAST YEAR     MEDICATIONS: Current Outpatient Medications on File Prior to Visit  Medication Sig Dispense Refill  . albuterol (PROVENTIL HFA;VENTOLIN HFA) 108 (90 Base) MCG/ACT inhaler Inhale 2 puffs into the lungs every 6 (six) hours as needed for wheezing  or shortness of breath. 3 Inhaler 3  . Alpha-D-Galactosidase (BEANO) TABS Take 1 tablet by mouth as needed (FOR GAS).     [name] amLODipine (NORVASC) 5 MG tablet Take 1 tablet (5 mg total) by mouth daily. 180 tablet 1  . ANDROGEL PUMP 20.25 MG/ACT (1.62%) GEL 4 pumps daily    . atorvastatin (LIPITOR) 80 MG tablet Take 80 mg by mouth at bedtime.     [name] azelastine (ASTELIN) 137 MCG/SPRAY nasal spray Place 1 spray into the nose daily as needed for rhinitis. Use in each nostril as directed     . budesonide-formoterol (SYMBICORT) 160-4.5 MCG/ACT inhaler Inhale 2 puffs into the lungs  2 (two) times daily. Use spacer 3 Inhaler 3  . Coenzyme Q10 (COQ10) 100 MG CAPS Take 100 mg by mouth daily.     [name] desonide (DESOWEN) 0.05 % cream Apply 1 application topically daily as needed (SKIN IRRITATION).     [name] ELIQUIS 5 MG TABS tablet Take 1 tablet by mouth 2 (two) times daily.    . fluticasone (FLONASE) 50 MCG/ACT nasal spray USE 2 SPRAYS IN EACH NOSTRIL DAILY AS NEEDED FOR ALLERGIES 48 g 3  . furosemide (LASIX) 20 MG tablet Take 20 mg by mouth daily.      . hydrochlorothiazide (HYDRODIURIL) 25 MG tablet Take 25 mg by mouth daily.    . hydrocortisone (ANUSOL-HC) 2.5 % rectal cream Place 1 application rectally 2 (two) times daily as needed for hemorrhoids.     . Insulin Degludec (TRESIBA FLEXTOUCH [place]) Inject 20 Units into the skin at bedtime.    [name] JARDIANCE 10 MG TABS tablet Take 10 mg by mouth daily.    [name] levothyroxine (SYNTHROID, LEVOTHROID) 50 MCG tablet Take 50 mcg by mouth daily.      . Loratadine (CLARITIN) 10 MG CAPS Take 1 capsule by mouth at bedtime.     [name] losartan (COZAAR) 50 MG tablet Take 1 tablet (50 mg total) by mouth 2 (two) times daily. 180 tablet 3  . LUMIGAN 0.01 % SOLN Place 1 drop into both eyes at bedtime.    . meclizine (ANTIVERT) 25 MG tablet Take 25 mg by mouth 3 (three) times daily as needed.    . metaxalone (SKELAXIN) 800 MG tablet Take 1 tablet (800 mg total) by mouth 3 (three) times daily as needed for muscle spasms. 90 tablet 0  . metFORMIN (GLUCOPHAGE) 500 MG tablet Take 1,000 mg by mouth 2 (two) times daily with a meal.     . metoprolol succinate (TOPROL-XL) 50 MG 24 hr tablet Take one and one-half tablets by mouth in the morning and one tablet by mouth in the evening. 90 tablet 3  . montelukast (SINGULAIR) 10 MG tablet Take 20 mg by mouth at bedtime.     . OMEGA-3 KRILL OIL 300 MG CAPS Take 1 capsule by mouth daily.      [name] OZEMPIC 1 MG/DOSE SOPN Inject 1 mg once a week as directed.     . Pancrelipase, Lip-Prot-Amyl, (CREON) 24000 UNITS CPEP Take  24,000-48,000 Units by mouth 3 (three) times daily as needed (digestion).     . pantoprazole (PROTONIX) 40 MG tablet Take 40 mg daily by mouth.     . potassium chloride SA (K-DUR,KLOR-CON) 20 MEQ tablet Take 20 mEq by mouth daily.      . Psyllium (HYDROCIL PO) Take 30 grams in 8 oz of water daily    . Simethicone (GAS-X PO) Take 180 mg by mouth as needed (gas).     [name]  Spacer/Aero-Holding Chambers (AEROCHAMBER MV) inhaler Use as instructed 1 each 0  . venlafaxine XR (EFFEXOR-XR) 75 MG 24 hr capsule TAKE 1 CAPSULE DAILY 90 capsule 0   No current facility-administered medications on file prior to visit.     ALLERGIES: No Known Allergies  FAMILY HISTORY: Family History  Problem Relation Age of Onset  . Heart disease Mother        died at 40 resp failure  . Breast cancer Mother   . Emphysema Father        died at 29 resp failure  . Hypertension Sister     SOCIAL HISTORY: Social History   Socioeconomic History  . Marital status: Married    Spouse name: Not on file  . Number of children: Not on file  . Years of education: Not on file  . Highest education level: Not on file  Occupational History  . Occupation: Retired    Comment: Scientist, research (life sciences)  Social Needs  . Financial resource strain: Not on file  . Food insecurity:    Worry: Not on file    Inability: Not on file  . Transportation needs:    Medical: Not on file    Non-medical: Not on file  Tobacco Use  . Smoking status: Former Smoker    Packs/day: 1.00    Years: 26.00    Pack years: 26.00    Types: Cigarettes    Last attempt to quit: 11/20/1982    Years since quitting: 35.4  . Smokeless tobacco: Never Used  Substance and Sexual Activity  . Alcohol use: Yes    Comment: SOCIAL  . Drug use: No  . Sexual activity: Not on file  Lifestyle  . Physical activity:    Days per week: Not on file    Minutes per session: Not on file  . Stress: Not on file  Relationships  . Social connections:    Talks on phone: Not  on file    Gets together: Not on file    Attends religious service: Not on file    Active member of club or organization: Not on file    Attends meetings of clubs or organizations: Not on file    Relationship status: Not on file  . Intimate partner violence:    Fear of current or ex partner: Not on file    Emotionally abused: Not on file    Physically abused: Not on file    Forced sexual activity: Not on file  Other Topics Concern  . Not on file  Social History Narrative  . Not on file    REVIEW OF SYSTEMS: Constitutional: No fevers, chills, or sweats, no generalized fatigue, change in appetite Eyes: No visual changes, double vision, eye pain Ear, nose and throat: No hearing loss, ear pain, nasal congestion, sore throat Cardiovascular: No chest pain, palpitations Respiratory:  No shortness of breath at rest or with exertion, wheezes GastrointestinaI: No nausea, vomiting, diarrhea, abdominal pain, fecal incontinence Genitourinary:  No dysuria, urinary retention or frequency Musculoskeletal:  No neck pain, back pain Integumentary: No rash, pruritus, skin lesions Neurological: as above Psychiatric: No depression, insomnia, anxiety Endocrine: No palpitations, fatigue, diaphoresis, mood swings, change in appetite, change in weight, increased thirst Hematologic/Lymphatic:  No purpura, petechiae. Allergic/Immunologic: no itchy/runny eyes, nasal congestion, recent allergic reactions, rashes  PHYSICAL EXAM: Vitals:   04/16/18 1405  BP: 138/68  Pulse: (!) 56  SpO2: 96%   General: No acute distress.  Patient appears well-groomed.   Head:  Normocephalic/atraumatic  Eyes:  Fundi examined but not visualized Neck: supple, no paraspinal tenderness, full range of motion Heart:  Regular rate and rhythm Lungs:  Clear to auscultation bilaterally Back: No paraspinal tenderness Neurological Exam: alert and oriented to person, place, and time. Attention span and concentration intact, recent  and remote memory intact, fund of knowledge intact.  Speech fluent and not dysarthric, language intact.  CN II-XII intact. Bulk and tone normal, muscle strength 5/5 throughout.  Sensation to light touch  intact.  Deep tendon reflexes 2+ throughout.  Finger to nose testing intact.  Unsteady gait.  Romberg positive.  IMPRESSION: Vertigo - prior testing by ENT reportedly was not consistent with BPPV, however this may still be a possible diagnosis.  Not likely migraine.  Consider persistent postural-perceptual dizziness, however symptoms usually are non-vertiginous.  PLAN: To break current flare-up of vertigo, I would like to prescribe a prednisone taper.  Given his diabetes, I informed him that he would need to closely monitor his blood glucose levels under the supervision of Dr. Brigitte Pulse.  He has an appointment with his acupuncturist and chiropractor this week, so he wants to see if treatment will help.  If not, he will contact me about the prednisone taper.  If ineffective, will need to either increase venlafaxine or switch to a different preventative medication.  He will follow up in 3 months.  16 minutes spent face to face with patient, over 50% spent discussing management.  Metta Clines, DO  CC: Marton Redwood, MD

## 2018-04-16 NOTE — Progress Notes (Signed)
04/16/2018 Colton Brooks   08/26/1941  [id number]  Primary Physician Marton Redwood, MD Primary Cardiologist: Dr. Rocky Crafts  Reason for Visit/CC: 6 Month f/u for PAF  HPI:  Colton Brooks is a 77 y.o. male, who is being seen today for routine 6 month cardiovascular examination. He is followed by Dr. Meda Coffee. PMH is notable for chronic AFib, bradycardia, chronic diastolic HF, OSA on CPAP, HTN, HL, DM2 and stage 3 CKD. LHC in 2011 demonstrated no CAD. His last 2D echo 06/06/16 demonstrated normal LVEF at 55-60%, moderate LVH. No WMA. There was aortic sclerosis w/o stenosis. Trivial MR was noted. Compared to his prior echo 11/2015, his EF was improved (previously 45-50%). He is on Eliquis for a/c given chronic atrial fibrillation.  He reports that he is done well without any cardiac symptoms since his last office visit in 2018.  He denies chest pain and dyspnea.  No exertional symptoms.  He also denies palpitations, lightheadedness, dizziness, syncope/near syncope.  No orthopnea PND or lower extremity edema.  He reports full medication compliance.  Blood pressures well controlled in clinic today. HR is controlled w/ BB.  He notes that he has been fully compliant with his CPAP machine nightly. He has had double knee replacement and is active. He bikes for exercise and swims. No limitations. Good exercise tolerance. He is retired. Retired 16 years ago. [place] often with his wife. Has grandchildren.   Current Meds  Medication Sig  . albuterol (PROVENTIL HFA;VENTOLIN HFA) 108 (90 Base) MCG/ACT inhaler Inhale 2 puffs into the lungs every 6 (six) hours as needed for wheezing or shortness of breath.  . Alpha-D-Galactosidase (BEANO) TABS Take 1 tablet by mouth as needed (FOR GAS).   [name] amLODipine (NORVASC) 5 MG tablet Take 1 tablet (5 mg total) by mouth daily.  . ANDROGEL PUMP 20.25 MG/ACT (1.62%) GEL 4 pumps daily  . atorvastatin (LIPITOR) 80 MG tablet Take 80 mg by mouth at bedtime.   [name] azelastine  (ASTELIN) 137 MCG/SPRAY nasal spray Place 1 spray into the nose daily as needed for rhinitis. Use in each nostril as directed   . budesonide-formoterol (SYMBICORT) 160-4.5 MCG/ACT inhaler Inhale 2 puffs into the lungs 2 (two) times daily. Use spacer  . Coenzyme Q10 (COQ10) 100 MG CAPS Take 100 mg by mouth daily.   [name] desonide (DESOWEN) 0.05 % cream Apply 1 application topically daily as needed (SKIN IRRITATION).   [name] ELIQUIS 5 MG TABS tablet Take 1 tablet by mouth 2 (two) times daily.  . fluticasone (FLONASE) 50 MCG/ACT nasal spray USE 2 SPRAYS IN EACH NOSTRIL DAILY AS NEEDED FOR ALLERGIES  . furosemide (LASIX) 20 MG tablet Take 20 mg by mouth daily.    . hydrochlorothiazide (HYDRODIURIL) 25 MG tablet Take 25 mg by mouth daily.  . hydrocortisone (ANUSOL-HC) 2.5 % rectal cream Place 1 application rectally 2 (two) times daily as needed for hemorrhoids.   . Insulin Degludec (TRESIBA FLEXTOUCH [place]) Inject 20 Units into the skin at bedtime.  [name] JARDIANCE 10 MG TABS tablet Take 10 mg by mouth daily.  [name] levothyroxine (SYNTHROID, LEVOTHROID) 50 MCG tablet Take 50 mcg by mouth daily.    . Loratadine (CLARITIN) 10 MG CAPS Take 1 capsule by mouth at bedtime.   [name] losartan (COZAAR) 50 MG tablet Take 1 tablet (50 mg total) by mouth 2 (two) times daily.  [name] LUMIGAN 0.01 % SOLN Place 1 drop into both eyes at bedtime.  . meclizine (ANTIVERT) 25 MG tablet Take 25 mg  by mouth 3 (three) times daily as needed.  . metaxalone (SKELAXIN) 800 MG tablet Take 1 tablet (800 mg total) by mouth 3 (three) times daily as needed for muscle spasms.  . metFORMIN (GLUCOPHAGE) 500 MG tablet Take 1,000 mg by mouth 2 (two) times daily with a meal.   . metoprolol succinate (TOPROL-XL) 50 MG 24 hr tablet Take one and one-half tablets by mouth in the morning and one tablet by mouth in the evening.  . montelukast (SINGULAIR) 10 MG tablet Take 20 mg by mouth at bedtime.   . OMEGA-3 KRILL OIL 300 MG CAPS Take 1 capsule by mouth daily.    [name]  OZEMPIC 1 MG/DOSE SOPN Inject 1 mg once a week as directed.   . Pancrelipase, Lip-Prot-Amyl, (CREON) 24000 UNITS CPEP Take 24,000-48,000 Units by mouth 3 (three) times daily as needed (digestion).   . pantoprazole (PROTONIX) 40 MG tablet Take 40 mg daily by mouth.   . potassium chloride SA (K-DUR,KLOR-CON) 20 MEQ tablet Take 20 mEq by mouth daily.    . Psyllium (HYDROCIL PO) Take 30 grams in 8 oz of water daily  . Salicylic Acid 6 % LOTN Apply topically as directed.  . Simethicone (GAS-X PO) Take 180 mg by mouth as needed (gas).   [name] Spacer/Aero-Holding Chambers (AEROCHAMBER MV) inhaler Use as instructed  . venlafaxine XR (EFFEXOR-XR) 75 MG 24 hr capsule TAKE 1 CAPSULE DAILY   No Known Allergies Past Medical History:  Diagnosis Date  . AF (atrial fibrillation) ([place])   . Allergic rhinitis, cause unspecified   . Arthritis    OA  . Asthma   . Chronic kidney disease    AT 67 PERCENMT MANAGED BY DR SHAW  . Hypertension   . Hypothyroidism   . OSA on CPAP   . PONV (postoperative nausea and vomiting)    WITH GENERAL PREFERS SPINAL  . 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 Sore on leg    RIGHT LOWEER LEG HEALING X 3 WEEKS  . Type II or unspecified type diabetes mellitus without mention of complication, not stated as uncontrolled    TYPE 2  . Vertigo    LAST YEAR    Family History  Problem Relation Age of Onset  . Heart disease Mother        died at 60 resp failure  . Breast cancer Mother   . Emphysema Father        died at 4 resp failure  . Hypertension Sister    Past Surgical History:  Procedure Laterality Date  . CARDIAC CATHETERIZATION    . EYE SURGERY Bilateral    BOTH EYES IOC LENS REPLACEMENT CATARACT  . INSERTION OF MESH N/A 11/29/2015   Procedure: INSERTION OF MESH;  Surgeon: Coralie Keens, MD;  Location: [facility];  Service: General;  Laterality: N/A;  . JOINT REPLACEMENT Right 10/2010   KNEE TOTAL JOINT   .  KNEE ARTHROSCOPY Right   . LEFT HAND SURGERY  1980   TENDON REPAIR  . TONSILLECTOMY    . 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 UMBILICAL HERNIA REPAIR N/A 11/29/2015   Procedure: HERNIA REPAIR UMBILICAL ADULT WITH MESH;  Surgeon: Coralie Keens, MD;  Location: [facility];  Service: General;  Laterality: N/A;   Social History  Socioeconomic History  . Marital status: Married    Spouse name: Not on file  . Number of children: Not on file  . Years of education: Not on file  . Highest education level: Not on file  Occupational History  . Occupation: Retired    Comment: Scientist, research (life sciences)  Social Needs  . Financial resource strain: Not on file  . Food insecurity:    Worry: Not on file    Inability: Not on file  . Transportation needs:    Medical: Not on file    Non-medical: Not on file  Tobacco Use  . Smoking status: Former Smoker    Packs/day: 1.00    Years: 26.00    Pack years: 26.00    Types: Cigarettes    Last attempt to quit: 11/20/1982    Years since quitting: 35.4  . Smokeless tobacco: Never Used  Substance and Sexual Activity  . Alcohol use: Yes    Comment: SOCIAL  . Drug use: No  . Sexual activity: Not on file  Lifestyle  . Physical activity:    Days per week: Not on file    Minutes per session: Not on file  . Stress: Not on file  Relationships  . Social connections:    Talks on phone: Not on file    Gets together: Not on file    Attends religious service: Not on file    Active member of club or organization: Not on file    Attends meetings of clubs or organizations: Not on file    Relationship status: Not on file  . Intimate partner violence:    Fear of current or ex partner: Not on file    Emotionally abused: Not on file    Physically abused: Not on file    Forced sexual activity: Not on file    Other Topics Concern  . Not on file  Social History Narrative  . Not on file     Review of Systems: General: negative for chills, fever, night sweats or weight changes.  Cardiovascular: negative for chest pain, dyspnea on exertion, edema, orthopnea, palpitations, paroxysmal nocturnal dyspnea or shortness of breath Dermatological: negative for rash Respiratory: negative for cough or wheezing Urologic: negative for hematuria Abdominal: negative for nausea, vomiting, diarrhea, bright red blood per rectum, melena, or hematemesis Neurologic: negative for visual changes, syncope, or dizziness All other systems reviewed and are otherwise negative except as noted above.   Physical Exam:  Blood pressure 126/72, pulse (!) 57, height 5\' 9"  (1.753 m), weight 218 lb 4 oz (99 kg), SpO2 98 %.  General appearance: alert, cooperative and no distress Neck: no carotid bruit and no JVD Lungs: clear to auscultation bilaterally Heart: irregularly irregular rhythm and regular rate Extremities: extremities normal, atraumatic, no cyanosis or edema Pulses: 2+ and symmetric Skin: Skin color, texture, turgor normal. No rashes or lesions Neurologic: Grossly normal  EKG not performed -- personally reviewed   ASSESSMENT AND PLAN:   1.  Chronic atrial fibrillation: rate is controlled in the upper 50s w/ metoprolol. Asymptomatic. On chronic Eliquis for a/c. Reports full compliance. No abnormal bleeding.   2.  Chronic diastolic heart failure: Euvolemic on physical exam.  He denies dyspnea, orthopnea, PND and lower extremity edema.  Blood pressure is well controlled. HR is controlled w/ BB.  3.  Hypertension: BP is well controlled on current regimen.  Routine labs including basic metabolic panel is followed by his PCP.  4.  Hyperlipidemia: Followed by his PCP at  [facility].  His last lipid panel on file was from July 2018 and showed controlled LDL at 46 mg/dL.  5.  Type 2 diabetes: Followed  by an managed by his PCP.  6.  Obstructive sleep apnea: Compliant with CPAP therapy nightly.  7.  Stage III chronic kidney disease: Followed by PCP.  8.  Hypothyroidism: On levothyroxine.  Managed by PCP.   Follow-Up w/ Dr. Meda Coffee in 6 months.   Calaya Gildner Ladoris Gene, MHS [facility] 04/16/2018 9:07 AM no

## 2018-04-16 NOTE — Patient Instructions (Signed)
Medication Instructions:   Your physician recommends that you continue on your current medications as directed. Please refer to the Current Medication list given to you today.    If you need a refill on your cardiac medications before your next appointment, please call your pharmacy.  Labwork: NONE ORDERED  TODAY    Testing/Procedures: NONE ORDERED  TODAY    Follow-Up: Your physician wants you to follow-up in:  IN  6  MONTHS WITH DR NELSON  You will receive a reminder letter in the mail two months in advance. If you don't receive a letter, please call our office to schedule the follow-up appointment.      Any Other Special Instructions Will Be Listed Below (If Applicable).                                                                                                                                                   

## 2018-05-01 DIAGNOSIS — H26491 Other secondary cataract, right eye: Secondary | ICD-10-CM | POA: Diagnosis not present

## 2018-05-01 DIAGNOSIS — H401232 Low-tension glaucoma, bilateral, moderate stage: Secondary | ICD-10-CM | POA: Diagnosis not present

## 2018-05-01 DIAGNOSIS — E119 Type 2 diabetes mellitus without complications: Secondary | ICD-10-CM | POA: Diagnosis not present

## 2018-05-01 DIAGNOSIS — H40053 Ocular hypertension, bilateral: Secondary | ICD-10-CM | POA: Diagnosis not present

## 2018-06-03 DIAGNOSIS — E7849 Other hyperlipidemia: Secondary | ICD-10-CM | POA: Diagnosis not present

## 2018-06-03 DIAGNOSIS — E291 Testicular hypofunction: Secondary | ICD-10-CM | POA: Diagnosis not present

## 2018-06-03 DIAGNOSIS — I10 Essential (primary) hypertension: Secondary | ICD-10-CM | POA: Diagnosis not present

## 2018-06-03 DIAGNOSIS — E038 Other specified hypothyroidism: Secondary | ICD-10-CM | POA: Diagnosis not present

## 2018-06-03 DIAGNOSIS — E1129 Type 2 diabetes mellitus with other diabetic kidney complication: Secondary | ICD-10-CM | POA: Diagnosis not present

## 2018-06-03 DIAGNOSIS — M1000 Idiopathic gout, unspecified site: Secondary | ICD-10-CM | POA: Diagnosis not present

## 2018-06-03 DIAGNOSIS — R82998 Other abnormal findings in urine: Secondary | ICD-10-CM | POA: Diagnosis not present

## 2018-06-04 ENCOUNTER — Other Ambulatory Visit: Payer: Self-pay | Admitting: Neurology

## 2018-06-05 DIAGNOSIS — I5022 Chronic systolic (congestive) heart failure: Secondary | ICD-10-CM | POA: Diagnosis not present

## 2018-06-05 DIAGNOSIS — Z7901 Long term (current) use of anticoagulants: Secondary | ICD-10-CM | POA: Diagnosis not present

## 2018-06-05 DIAGNOSIS — J45909 Unspecified asthma, uncomplicated: Secondary | ICD-10-CM | POA: Diagnosis not present

## 2018-06-05 DIAGNOSIS — E7849 Other hyperlipidemia: Secondary | ICD-10-CM | POA: Diagnosis not present

## 2018-06-05 DIAGNOSIS — I428 Other cardiomyopathies: Secondary | ICD-10-CM | POA: Diagnosis not present

## 2018-06-05 DIAGNOSIS — E1129 Type 2 diabetes mellitus with other diabetic kidney complication: Secondary | ICD-10-CM | POA: Diagnosis not present

## 2018-06-05 DIAGNOSIS — N183 Chronic kidney disease, stage 3 (moderate): Secondary | ICD-10-CM | POA: Diagnosis not present

## 2018-06-05 DIAGNOSIS — Z0000 Encounter for general adult medical examination without abnormal findings: Secondary | ICD-10-CM | POA: Diagnosis not present

## 2018-06-05 DIAGNOSIS — Z1389 Encounter for screening for other disorder: Secondary | ICD-10-CM | POA: Diagnosis not present

## 2018-06-05 DIAGNOSIS — I10 Essential (primary) hypertension: Secondary | ICD-10-CM | POA: Diagnosis not present

## 2018-06-05 DIAGNOSIS — Z6833 Body mass index (BMI) 33.0-33.9, adult: Secondary | ICD-10-CM | POA: Diagnosis not present

## 2018-06-05 DIAGNOSIS — I480 Paroxysmal atrial fibrillation: Secondary | ICD-10-CM | POA: Diagnosis not present

## 2018-07-03 DIAGNOSIS — H04123 Dry eye syndrome of bilateral lacrimal glands: Secondary | ICD-10-CM | POA: Diagnosis not present

## 2018-07-03 DIAGNOSIS — H01003 Unspecified blepharitis right eye, unspecified eyelid: Secondary | ICD-10-CM | POA: Diagnosis not present

## 2018-07-03 DIAGNOSIS — H40053 Ocular hypertension, bilateral: Secondary | ICD-10-CM | POA: Diagnosis not present

## 2018-07-03 DIAGNOSIS — H401232 Low-tension glaucoma, bilateral, moderate stage: Secondary | ICD-10-CM | POA: Diagnosis not present

## 2018-08-12 ENCOUNTER — Ambulatory Visit (INDEPENDENT_AMBULATORY_CARE_PROVIDER_SITE_OTHER): Payer: Medicare Other | Admitting: Neurology

## 2018-08-12 ENCOUNTER — Encounter: Payer: Self-pay | Admitting: Neurology

## 2018-08-12 VITALS — BP 130/64 | HR 71 | Ht 69.0 in | Wt 217.0 lb

## 2018-08-12 DIAGNOSIS — R42 Dizziness and giddiness: Secondary | ICD-10-CM

## 2018-08-12 NOTE — Progress Notes (Signed)
NEUROLOGY FOLLOW UP OFFICE NOTE  Colton Brooks [id number]  HISTORY OF PRESENT ILLNESS: Colton Brooks is a 77 year old right-handed male with chronic CHF, atrial fibrillation, dilated cardiomyopathy, hypertension and diabetes who follows up for vertigo.  He is accompanied by his wife who supplements history.  UPDATE: He continues on venlafaxine XR 75mg  daily.  Due to flare up of vertigo in May, he was prescribed a prednisone taper, which helped break the flare.  Since then, he has only brief positional vertigo lasting a few seconds.  HISTORY: In May 2017, he developed vertigo.  It was thought to be due to [facility], which he had been on for 6 months.  In addition, he developed a rash and pruritis.  Symptoms improved after discontinuation of the medication, but never resolved.  He has it daily.  He describes it as an acute onset spinning sensation.  It usually positional, such as turning over in bed or gazing up or down, but could be spontaneous as well.  Turning to the right is worse than to the left.  When he wakes up in bed in the morning, he feels dizzy.  He staggers when he walks.  Spells typically last a few seconds but on some occasions last up to 30 minutes.  There is associated nausea and he would likely vomit if not for an antiemetic.  He reports some hearing loss in both ears, as well as tinnitus.  He denies associated lightheadedness, headache, double vision, unilateral facial or extremity numbness or weakness.    He has tried several therapies.  Meclizine and acupuncture provide mild relief.  He also sees a Restaurant manager, fast food.  Vestibular rehab was ineffective.  He was evaluated by ENT and underwent audiogram/ENG, which reportedly did not demonstrate peripheral etiology.  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  MRA of head showed no significant  proximal or large intracranial vessel stenosis or occlusion.  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    He reports one prior episode of vertigo 10 years ago, a reaction to a cold medicine with codeine.  He has atrial fibrillation and reports daily episodes of racing heartbeat lasting just seconds.[name]  PAST MEDICAL HISTORY: Past Medical History:  Diagnosis Date  . AF (atrial fibrillation) ([place])   . Allergic rhinitis, cause unspecified   . Arthritis    OA  . Asthma   . Chronic kidney disease    AT 57 PERCENMT MANAGED BY DR SHAW  . Hypertension   . Hypothyroidism   . OSA on CPAP   . PONV (postoperative nausea and vomiting)    WITH GENERAL PREFERS SPINAL  . 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 Sore on leg    RIGHT LOWEER LEG HEALING X 3 WEEKS  . Type II or unspecified type diabetes mellitus without mention of complication, not stated as uncontrolled    TYPE 2  . Vertigo    LAST YEAR     MEDICATIONS: Current Outpatient Medications on File Prior to Visit  Medication Sig Dispense Refill  . albuterol (PROVENTIL HFA;VENTOLIN HFA) 108 (90 Base) MCG/ACT inhaler Inhale 2 puffs into the lungs every 6 (six) hours as needed for wheezing or shortness of breath. 3 Inhaler 3  . Alpha-D-Galactosidase (BEANO) TABS Take 1 tablet  by mouth as needed (FOR GAS).     [name] amLODipine (NORVASC) 5 MG tablet Take 1 tablet (5 mg total) by mouth daily. 180 tablet 1  . ANDROGEL PUMP 20.25 MG/ACT (1.62%) GEL 4 pumps daily    . atorvastatin (LIPITOR) 80 MG tablet Take 80 mg by mouth at bedtime.     [name] azelastine (ASTELIN) 137 MCG/SPRAY nasal spray Place 1 spray into the nose daily as needed for rhinitis. Use in each nostril as directed     . budesonide-formoterol (SYMBICORT) 160-4.5 MCG/ACT inhaler Inhale 2 puffs into the lungs 2 (two) times daily. Use spacer 3 Inhaler 3  . Coenzyme Q10 (COQ10) 100 MG CAPS Take 100 mg by mouth  daily.     [name] desonide (DESOWEN) 0.05 % cream Apply 1 application topically daily as needed (SKIN IRRITATION).     [name] ELIQUIS 5 MG TABS tablet Take 1 tablet by mouth 2 (two) times daily.    . fluticasone (FLONASE) 50 MCG/ACT nasal spray USE 2 SPRAYS IN EACH NOSTRIL DAILY AS NEEDED FOR ALLERGIES 48 g 3  . furosemide (LASIX) 20 MG tablet Take 20 mg by mouth daily.      . hydrochlorothiazide (HYDRODIURIL) 25 MG tablet Take 25 mg by mouth daily.    . hydrocortisone (ANUSOL-HC) 2.5 % rectal cream Place 1 application rectally 2 (two) times daily as needed for hemorrhoids.     . Insulin Degludec (TRESIBA FLEXTOUCH [place]) Inject 20 Units into the skin at bedtime.    [name] JARDIANCE 10 MG TABS tablet Take 10 mg by mouth daily.    [name] levothyroxine (SYNTHROID, LEVOTHROID) 50 MCG tablet Take 50 mcg by mouth daily.      . Loratadine (CLARITIN) 10 MG CAPS Take 1 capsule by mouth at bedtime.     [name] losartan (COZAAR) 50 MG tablet Take 1 tablet (50 mg total) by mouth 2 (two) times daily. 180 tablet 3  . LUMIGAN 0.01 % SOLN Place 1 drop into both eyes at bedtime.    . meclizine (ANTIVERT) 25 MG tablet Take 25 mg by mouth 3 (three) times daily as needed.    . metaxalone (SKELAXIN) 800 MG tablet Take 1 tablet (800 mg total) by mouth 3 (three) times daily as needed for muscle spasms. 90 tablet 0  . metFORMIN (GLUCOPHAGE) 500 MG tablet Take 1,000 mg by mouth 2 (two) times daily with a meal.     . metoprolol succinate (TOPROL-XL) 50 MG 24 hr tablet Take one and one-half tablets by mouth in the morning and one tablet by mouth in the evening. 90 tablet 3  . montelukast (SINGULAIR) 10 MG tablet Take 20 mg by mouth at bedtime.     . OMEGA-3 KRILL OIL 300 MG CAPS Take 1 capsule by mouth daily.      [name] OZEMPIC 1 MG/DOSE SOPN Inject 1 mg once a week as directed.     . Pancrelipase, Lip-Prot-Amyl, (CREON) 24000 UNITS CPEP Take 24,000-48,000 Units by mouth 3 (three) times daily as needed (digestion).     . pantoprazole (PROTONIX) 40 MG  tablet Take 40 mg daily by mouth.     . potassium chloride SA (K-DUR,KLOR-CON) 20 MEQ tablet Take 20 mEq by mouth daily.      . Psyllium (HYDROCIL PO) Take 30 grams in 8 oz of water daily    . Salicylic Acid 6 % LOTN Apply topically as directed.    . Simethicone (GAS-X PO) Take 180 mg by mouth as needed (gas).     [name]  Spacer/Aero-Holding Chambers (AEROCHAMBER MV) inhaler Use as instructed 1 each 0  . venlafaxine XR (EFFEXOR-XR) 75 MG 24 hr capsule TAKE 1 CAPSULE DAILY 90 capsule 0   No current facility-administered medications on file prior to visit.     ALLERGIES: No Known Allergies  FAMILY HISTORY: Family History  Problem Relation Age of Onset  . Heart disease Mother        died at 61 resp failure  . Breast cancer Mother   . Emphysema Father        died at 1 resp failure  . Hypertension Sister     SOCIAL HISTORY: Social History   Socioeconomic History  . Marital status: Married    Spouse name: Not on file  . Number of children: Not on file  . Years of education: Not on file  . Highest education level: Not on file  Occupational History  . Occupation: Retired    Comment: Scientist, research (life sciences)  Social Needs  . Financial resource strain: Not on file  . Food insecurity:    Worry: Not on file    Inability: Not on file  . Transportation needs:    Medical: Not on file    Non-medical: Not on file  Tobacco Use  . Smoking status: Former Smoker    Packs/day: 1.00    Years: 26.00    Pack years: 26.00    Types: Cigarettes    Last attempt to quit: 11/20/1982    Years since quitting: 35.7  . Smokeless tobacco: Never Used  Substance and Sexual Activity  . Alcohol use: Yes    Comment: SOCIAL  . Drug use: No  . Sexual activity: Not on file  Lifestyle  . Physical activity:    Days per week: Not on file    Minutes per session: Not on file  . Stress: Not on file  Relationships  . Social connections:    Talks on phone: Not on file    Gets together: Not on file    Attends  religious service: Not on file    Active member of club or organization: Not on file    Attends meetings of clubs or organizations: Not on file    Relationship status: Not on file  . Intimate partner violence:    Fear of current or ex partner: Not on file    Emotionally abused: Not on file    Physically abused: Not on file    Forced sexual activity: Not on file  Other Topics Concern  . Not on file  Social History Narrative  . Not on file    REVIEW OF SYSTEMS: Constitutional: No fevers, chills, or sweats, no generalized fatigue, change in appetite Eyes: No visual changes, double vision, eye pain Ear, nose and throat: No hearing loss, ear pain, nasal congestion, sore throat Cardiovascular: No chest pain, palpitations Respiratory:  No shortness of breath at rest or with exertion, wheezes GastrointestinaI: No nausea, vomiting, diarrhea, abdominal pain, fecal incontinence Genitourinary:  No dysuria, urinary retention or frequency Musculoskeletal:  No neck pain, back pain Integumentary: No rash, pruritus, skin lesions Neurological: as above Psychiatric: No depression, insomnia, anxiety Endocrine: No palpitations, fatigue, diaphoresis, mood swings, change in appetite, change in weight, increased thirst Hematologic/Lymphatic:  No purpura, petechiae. Allergic/Immunologic: no itchy/runny eyes, nasal congestion, recent allergic reactions, rashes  PHYSICAL EXAM: Blood pressure 130/64, pulse 71, height 5\' 9"  (1.753 m), weight 217 lb (98.4 kg), SpO2 97 %. General: No acute distress.  Patient appears well-groomed.   Head:  Normocephalic/atraumatic Eyes:  Fundi examined but not visualized Neck: supple, no paraspinal tenderness, full range of motion Heart:  Regular rate and rhythm Lungs:  Clear to auscultation bilaterally Back: No paraspinal tenderness Neurological Exam: alert and oriented to person, place, and time. Attention span and concentration intact, recent and remote memory intact,  fund of knowledge intact.  Speech fluent and not dysarthric, language intact.  CN II-XII intact. Bulk and tone normal, muscle strength 5/5 throughout.  Sensation to light touch, temperature and vibration intact.  Deep tendon reflexes 1+ upper extremities, absent lower extremities.  Finger to nose testing intact.  Gait antalgic  IMPRESSION: Vertigo  PLAN: 1.  Continue venlafaxine XR 75mg  daily 2.  Follow up in 5 months.  16 minutes spent face to face with patient, over 50% spent discussing management.  Metta Clines, DO  CC:  Marton Redwood, MD

## 2018-09-24 ENCOUNTER — Ambulatory Visit (INDEPENDENT_AMBULATORY_CARE_PROVIDER_SITE_OTHER): Payer: Medicare Other | Admitting: Cardiology

## 2018-09-24 ENCOUNTER — Encounter: Payer: Self-pay | Admitting: Cardiology

## 2018-09-24 VITALS — BP 128/62 | HR 63 | Ht 69.0 in | Wt 218.0 lb

## 2018-09-24 DIAGNOSIS — I482 Chronic atrial fibrillation, unspecified: Secondary | ICD-10-CM

## 2018-09-24 NOTE — Progress Notes (Signed)
09/24/2018 Colton Brooks   12-12-1940  [id number]  Primary Physician Marton Redwood, MD Primary Cardiologist: Dr. Meda Coffee  Electrophysiologist:   Reason for Visit/CC: 6 Month f/u for Atrial Fibrillation   HPI:  Colton Brooks is a 77 y.o. male, who is being seen today for routine 6 month cardiovascular examination. He is followed by Dr. Meda Coffee. PMH is notable for chronic AFib, bradycardia, chronic diastolic HF, OSA on CPAP, HTN, HL, DM2 and stage 3 CKD. LHC in 2011 demonstrated no CAD. His last 2D echo 06/06/16 demonstrated normal LVEF at 55-60%, moderate LVH. No WMA. There was aortic sclerosis w/o stenosis. Trivial MR was noted. Compared to his prior echo 11/2015, his EF was improved (previously 45-50%). He is on Eliquis for a/c given chronic atrial fibrillation.I last evaluated him in May of this year for routine visit. He was doing well w/o any cardiac symptoms. Had been doing a lot of traveling with his wife. He was instructed to return in 6 months w/ Dr. Meda Coffee.  I am seeing him again today for f/u. He reports that he is doing well. No complaints. Denies cardiac symptoms including no palpitations, CP or dyspnea. He remains very active. He goes to the gym and takes a cycling class. No exertional symptoms with exercise. He also denies LEE, orthopnea and PND. Fully compliant w/ CPAP. No abnormal bleeding with Eliquis. His PCP follows his labs. Labs in July showed LDL at 40 mg/dL, TG at 70 mg/dL. Hgb A1c controlled at 6.0. Scr stable at 1.2.   EKG shows chronic afib. Rate is controlled at 65 bpm. BP is well controlled at 128/62.  Current Meds  Medication Sig  . albuterol (PROVENTIL HFA;VENTOLIN HFA) 108 (90 Base) MCG/ACT inhaler Inhale 2 puffs into the lungs every 6 (six) hours as needed for wheezing or shortness of breath.  . Alpha-D-Galactosidase (BEANO) TABS Take 1 tablet by mouth as needed (FOR GAS).   [name] amLODipine (NORVASC) 5 MG tablet Take 1 tablet (5 mg total) by mouth daily.  .  ANDROGEL PUMP 20.25 MG/ACT (1.62%) GEL 4 pumps daily  . atorvastatin (LIPITOR) 80 MG tablet Take 80 mg by mouth at bedtime.   [name] azelastine (ASTELIN) 137 MCG/SPRAY nasal spray Place 1 spray into the nose daily as needed for rhinitis. Use in each nostril as directed   . budesonide-formoterol (SYMBICORT) 160-4.5 MCG/ACT inhaler Inhale 2 puffs into the lungs 2 (two) times daily. Use spacer  . Coenzyme Q10 (COQ10) 100 MG CAPS Take 100 mg by mouth daily.   [name] desonide (DESOWEN) 0.05 % cream Apply 1 application topically daily as needed (SKIN IRRITATION).   [name] ELIQUIS 5 MG TABS tablet Take 1 tablet by mouth 2 (two) times daily.  . fluticasone (FLONASE) 50 MCG/ACT nasal spray USE 2 SPRAYS IN EACH NOSTRIL DAILY AS NEEDED FOR ALLERGIES  . furosemide (LASIX) 20 MG tablet Take 20 mg by mouth daily.    . hydrochlorothiazide (HYDRODIURIL) 25 MG tablet Take 25 mg by mouth daily.  . hydrocortisone (ANUSOL-HC) 2.5 % rectal cream Place 1 application rectally 2 (two) times daily as needed for hemorrhoids.   . Insulin Degludec (TRESIBA FLEXTOUCH [place]) Inject 20 Units into the skin at bedtime.  [name] JARDIANCE 10 MG TABS tablet Take 10 mg by mouth daily.  [name] levothyroxine (SYNTHROID, LEVOTHROID) 50 MCG tablet Take 50 mcg by mouth daily.    . Loratadine (CLARITIN) 10 MG CAPS Take 1 capsule by mouth at bedtime.   [name] losartan (COZAAR) 50 MG tablet  Take 1 tablet (50 mg total) by mouth 2 (two) times daily.  [name] LUMIGAN 0.01 % SOLN Place 1 drop into both eyes at bedtime.  . meclizine (ANTIVERT) 25 MG tablet Take 25 mg by mouth 3 (three) times daily as needed.  . metaxalone (SKELAXIN) 800 MG tablet Take 1 tablet (800 mg total) by mouth 3 (three) times daily as needed for muscle spasms.  . metFORMIN (GLUCOPHAGE) 500 MG tablet Take 1,000 mg by mouth 2 (two) times daily with a meal.   . metoprolol succinate (TOPROL-XL) 50 MG 24 hr tablet Take one and one-half tablets by mouth in the morning and one tablet by mouth in the evening.  .  modafinil (PROVIGIL) 200 MG tablet   . montelukast (SINGULAIR) 10 MG tablet Take 20 mg by mouth at bedtime.   . OMEGA-3 KRILL OIL 300 MG CAPS Take 1 capsule by mouth daily.    [name] OZEMPIC 1 MG/DOSE SOPN Inject 1 mg once a week as directed.   . Pancrelipase, Lip-Prot-Amyl, (CREON) 24000 UNITS CPEP Take 24,000-48,000 Units by mouth 3 (three) times daily as needed (digestion).   . pantoprazole (PROTONIX) 40 MG tablet Take 40 mg daily by mouth.   . potassium chloride SA (K-DUR,KLOR-CON) 20 MEQ tablet Take 20 mEq by mouth daily.    . Psyllium (HYDROCIL PO) Take 30 grams in 8 oz of water daily  . Salicylic Acid 6 % LOTN Apply topically as directed.  . Simethicone (GAS-X PO) Take 180 mg by mouth as needed (gas).   [name] Spacer/Aero-Holding Chambers (AEROCHAMBER MV) inhaler Use as instructed  . venlafaxine XR (EFFEXOR-XR) 75 MG 24 hr capsule TAKE 1 CAPSULE DAILY   No Known Allergies Past Medical History:  Diagnosis Date  . AF (atrial fibrillation) ([place])   . Allergic rhinitis, cause unspecified   . Arthritis    OA  . Asthma   . Chronic kidney disease    AT 13 PERCENMT MANAGED BY DR SHAW  . Hypertension   . Hypothyroidism   . OSA on CPAP   . PONV (postoperative nausea and vomiting)    WITH GENERAL PREFERS SPINAL  . 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 Sore on leg    RIGHT LOWEER LEG HEALING X 3 WEEKS  . Type II or unspecified type diabetes mellitus without mention of complication, not stated as uncontrolled    TYPE 2  . Vertigo    LAST YEAR    Family History  Problem Relation Age of Onset  . Heart disease Mother        died at 4 resp failure  . Breast cancer Mother   . Emphysema Father        died at 77 resp failure  . Hypertension Sister    Past Surgical History:  Procedure Laterality Date  . CARDIAC CATHETERIZATION    . EYE SURGERY Bilateral    BOTH EYES IOC LENS REPLACEMENT CATARACT  . INSERTION OF MESH N/A 11/29/2015   Procedure:  INSERTION OF MESH;  Surgeon: Coralie Keens, MD;  Location: [facility];  Service: General;  Laterality: N/A;  . JOINT REPLACEMENT Right 10/2010   KNEE TOTAL JOINT   . KNEE ARTHROSCOPY Right   . LEFT HAND SURGERY  1980   TENDON REPAIR  . TONSILLECTOMY    . TOTAL KNEE ARTHROPLASTY Left 11/06/2016   Procedure: LEFT TOTAL KNEE ARTHROPLASTY;  Surgeon: Gaynelle Arabian, MD;  Location: [facility];  Service: Orthopedics;  Laterality: Left;  . UMBILICAL HERNIA REPAIR N/A 11/29/2015   Procedure: HERNIA REPAIR UMBILICAL ADULT WITH MESH;  Surgeon: Coralie Keens, MD;  Location: [facility];  Service: General;  Laterality: N/A;   Social History   Socioeconomic History  . Marital status: Married    Spouse name: Not on file  . Number of children: Not on file  . Years of education: Not on file  . Highest education level: Not on file  Occupational History  . Occupation: Retired    Comment: Scientist, research (life sciences)  Social Needs  . Financial resource strain: Not on file  . Food insecurity:    Worry: Not on file    Inability: Not on file  . Transportation needs:    Medical: Not on file    Non-medical: Not on file  Tobacco Use  . Smoking status: Former Smoker    Packs/day: 1.00    Years: 26.00    Pack years: 26.00    Types: Cigarettes    Last attempt to quit: 11/20/1982    Years since quitting: 35.8  . Smokeless tobacco: Never Used  Substance and Sexual Activity  . Alcohol use: Yes    Comment: SOCIAL  . Drug use: No  . Sexual activity: Not on file  Lifestyle  . Physical activity:    Days per week: Not on file    Minutes per session: Not on file  . Stress: Not on file  Relationships  . Social connections:    Talks on phone: Not on file    Gets together: Not on file    Attends religious service: Not on file    Active member of club or organization: Not on file    Attends meetings of clubs or organizations: Not on file    Relationship status: Not on file  .  Intimate partner violence:    Fear of current or ex partner: Not on file    Emotionally abused: Not on file    Physically abused: Not on file    Forced sexual activity: Not on file  Other Topics Concern  . Not on file  Social History Narrative  . Not on file     Review of Systems: General: negative for chills, fever, night sweats or weight changes.  Cardiovascular: negative for chest pain, dyspnea on exertion, edema, orthopnea, palpitations, paroxysmal nocturnal dyspnea or shortness of breath Dermatological: negative for rash Respiratory: negative for cough or wheezing Urologic: negative for hematuria Abdominal: negative for nausea, vomiting, diarrhea, bright red blood per rectum, melena, or hematemesis Neurologic: negative for visual changes, syncope, or dizziness All other systems reviewed and are otherwise negative except as noted above.   Physical Exam:  Blood pressure 128/62, pulse 63, height 5\' 9"  (1.753 m), weight 218 lb (98.9 kg), SpO2 94 %.  General appearance: alert, cooperative and no distress Neck: no carotid bruit and no JVD Lungs: clear to auscultation bilaterally Heart: irregularly irregular rhythm and regular rate Extremities: extremities normal, atraumatic, no cyanosis or edema Pulses: 2+ and symmetric Skin: Skin color, texture, turgor normal. No rashes or lesions Neurologic: Grossly normal  EKG chronic afib 63 bpm  -- personally reviewed   ASSESSMENT AND PLAN:   1.  Chronic atrial fibrillation: rate is controlled in the 60s  w/ metoprolol. Asymptomatic. On chronic Eliquis for a/c. Reports full compliance. No abnormal bleeding. No med changes made today.   2.  Chronic diastolic heart failure: Euvolemic on physical exam.  He denies  dyspnea, orthopnea, PND and lower extremity edema.  Blood pressure is well controlled. HR is controlled w/ BB. Continue current regimen.   3.  Hypertension: BP is well controlled on current regimen.  Routine labs including basic  metabolic panel is followed by his PCP. Labs in July showed normal renal function and K.   4.  Hyperlipidemia: Followed by his PCP at [facility].  His last lipid panel on file was from July 2019 and showed controlled LDL at 40 mg/dL.  5.  Type 2 diabetes: Followed by an managed by his PCP. Well controlled. Last Hgb A1c was 6.0.   6.  Obstructive sleep apnea: Compliant with CPAP therapy nightly.  7.  Stage III chronic kidney disease: Followed by PCP. He is on Jardiance for DM as well as Lasix and HCTZ. SCr stable at 1.20 on recent labs.   8.  Hypothyroidism: On levothyroxine.  Managed by PCP.   Follow-Up w/ Dr. Meda Coffee in 6 months.    Aurore Redinger Ladoris Gene, MHS [facility] 09/24/2018 3:23 PM

## 2018-10-19 ENCOUNTER — Other Ambulatory Visit: Payer: Self-pay | Admitting: Neurology

## 2018-10-21 ENCOUNTER — Other Ambulatory Visit: Payer: Self-pay | Admitting: Neurology

## 2018-10-29 DIAGNOSIS — J45998 Other asthma: Secondary | ICD-10-CM | POA: Diagnosis not present

## 2018-10-29 DIAGNOSIS — I480 Paroxysmal atrial fibrillation: Secondary | ICD-10-CM | POA: Diagnosis not present

## 2018-10-29 DIAGNOSIS — I5022 Chronic systolic (congestive) heart failure: Secondary | ICD-10-CM | POA: Diagnosis not present

## 2018-10-29 DIAGNOSIS — E291 Testicular hypofunction: Secondary | ICD-10-CM | POA: Diagnosis not present

## 2018-10-29 DIAGNOSIS — Z7901 Long term (current) use of anticoagulants: Secondary | ICD-10-CM | POA: Diagnosis not present

## 2018-10-29 DIAGNOSIS — E1129 Type 2 diabetes mellitus with other diabetic kidney complication: Secondary | ICD-10-CM | POA: Diagnosis not present

## 2018-10-29 DIAGNOSIS — Z6833 Body mass index (BMI) 33.0-33.9, adult: Secondary | ICD-10-CM | POA: Diagnosis not present

## 2018-10-29 DIAGNOSIS — E7849 Other hyperlipidemia: Secondary | ICD-10-CM | POA: Diagnosis not present

## 2018-10-29 DIAGNOSIS — N183 Chronic kidney disease, stage 3 (moderate): Secondary | ICD-10-CM | POA: Diagnosis not present

## 2018-10-29 DIAGNOSIS — I10 Essential (primary) hypertension: Secondary | ICD-10-CM | POA: Diagnosis not present

## 2018-10-29 DIAGNOSIS — Z23 Encounter for immunization: Secondary | ICD-10-CM | POA: Diagnosis not present

## 2018-10-29 DIAGNOSIS — M7660 Achilles tendinitis, unspecified leg: Secondary | ICD-10-CM | POA: Diagnosis not present

## 2018-12-02 DIAGNOSIS — M7661 Achilles tendinitis, right leg: Secondary | ICD-10-CM | POA: Diagnosis not present

## 2018-12-02 DIAGNOSIS — M7662 Achilles tendinitis, left leg: Secondary | ICD-10-CM | POA: Diagnosis not present

## 2018-12-17 DIAGNOSIS — Z85828 Personal history of other malignant neoplasm of skin: Secondary | ICD-10-CM | POA: Diagnosis not present

## 2018-12-17 DIAGNOSIS — L821 Other seborrheic keratosis: Secondary | ICD-10-CM | POA: Diagnosis not present

## 2018-12-17 DIAGNOSIS — L814 Other melanin hyperpigmentation: Secondary | ICD-10-CM | POA: Diagnosis not present

## 2018-12-17 DIAGNOSIS — L218 Other seborrheic dermatitis: Secondary | ICD-10-CM | POA: Diagnosis not present

## 2018-12-17 DIAGNOSIS — M7662 Achilles tendinitis, left leg: Secondary | ICD-10-CM | POA: Diagnosis not present

## 2018-12-17 DIAGNOSIS — M7661 Achilles tendinitis, right leg: Secondary | ICD-10-CM | POA: Diagnosis not present

## 2018-12-18 DIAGNOSIS — M7662 Achilles tendinitis, left leg: Secondary | ICD-10-CM | POA: Diagnosis not present

## 2018-12-18 DIAGNOSIS — M7661 Achilles tendinitis, right leg: Secondary | ICD-10-CM | POA: Diagnosis not present

## 2018-12-23 DIAGNOSIS — M7662 Achilles tendinitis, left leg: Secondary | ICD-10-CM | POA: Diagnosis not present

## 2018-12-23 DIAGNOSIS — M7661 Achilles tendinitis, right leg: Secondary | ICD-10-CM | POA: Diagnosis not present

## 2018-12-30 DIAGNOSIS — M7662 Achilles tendinitis, left leg: Secondary | ICD-10-CM | POA: Diagnosis not present

## 2018-12-30 DIAGNOSIS — M7661 Achilles tendinitis, right leg: Secondary | ICD-10-CM | POA: Diagnosis not present

## 2018-12-31 DIAGNOSIS — D485 Neoplasm of uncertain behavior of skin: Secondary | ICD-10-CM | POA: Diagnosis not present

## 2018-12-31 DIAGNOSIS — L821 Other seborrheic keratosis: Secondary | ICD-10-CM | POA: Diagnosis not present

## 2018-12-31 DIAGNOSIS — Z85828 Personal history of other malignant neoplasm of skin: Secondary | ICD-10-CM | POA: Diagnosis not present

## 2019-01-01 DIAGNOSIS — M7662 Achilles tendinitis, left leg: Secondary | ICD-10-CM | POA: Diagnosis not present

## 2019-01-01 DIAGNOSIS — H401232 Low-tension glaucoma, bilateral, moderate stage: Secondary | ICD-10-CM | POA: Diagnosis not present

## 2019-01-01 DIAGNOSIS — M7661 Achilles tendinitis, right leg: Secondary | ICD-10-CM | POA: Diagnosis not present

## 2019-01-01 DIAGNOSIS — H3509 Other intraretinal microvascular abnormalities: Secondary | ICD-10-CM | POA: Diagnosis not present

## 2019-01-01 DIAGNOSIS — H35033 Hypertensive retinopathy, bilateral: Secondary | ICD-10-CM | POA: Diagnosis not present

## 2019-01-01 DIAGNOSIS — E119 Type 2 diabetes mellitus without complications: Secondary | ICD-10-CM | POA: Diagnosis not present

## 2019-01-07 DIAGNOSIS — M7661 Achilles tendinitis, right leg: Secondary | ICD-10-CM | POA: Diagnosis not present

## 2019-01-07 DIAGNOSIS — M7662 Achilles tendinitis, left leg: Secondary | ICD-10-CM | POA: Diagnosis not present

## 2019-01-09 DIAGNOSIS — M7661 Achilles tendinitis, right leg: Secondary | ICD-10-CM | POA: Diagnosis not present

## 2019-01-09 DIAGNOSIS — M7662 Achilles tendinitis, left leg: Secondary | ICD-10-CM | POA: Diagnosis not present

## 2019-01-13 ENCOUNTER — Ambulatory Visit: Payer: Medicare Other | Admitting: Neurology

## 2019-01-21 DIAGNOSIS — M7662 Achilles tendinitis, left leg: Secondary | ICD-10-CM | POA: Diagnosis not present

## 2019-01-21 DIAGNOSIS — M7661 Achilles tendinitis, right leg: Secondary | ICD-10-CM | POA: Diagnosis not present

## 2019-01-29 DIAGNOSIS — M7661 Achilles tendinitis, right leg: Secondary | ICD-10-CM | POA: Diagnosis not present

## 2019-01-29 DIAGNOSIS — M7662 Achilles tendinitis, left leg: Secondary | ICD-10-CM | POA: Diagnosis not present

## 2019-03-12 ENCOUNTER — Ambulatory Visit: Payer: Medicare Other | Admitting: Internal Medicine

## 2019-03-14 NOTE — Progress Notes (Signed)
Virtual Visit via Video Note The purpose of this virtual visit is to provide medical care while limiting exposure to the novel coronavirus.    Consent was obtained for video visit:  Yes.   Answered questions that patient had about telehealth interaction:  Yes.   I discussed the limitations, risks, security and privacy concerns of performing an evaluation and management service by telemedicine. I also discussed with the patient that there may be a patient responsible charge related to this service. The patient expressed understanding and agreed to proceed.  Pt location: Home Physician Location: office Name of referring provider:  Marton Redwood, MD I connected with Marline Backbone at patients initiation/request on 03/17/2019 at  1:30 PM EDT by video enabled telemedicine application and verified that I am speaking with the correct person using two identifiers. Pt MRN:  [id number] Pt DOB:  [birth year] Video Participants:  Marline Backbone;  His wife.   History of Present Illness:  Colton Brooks is a 78 year old right-handed man with chronic CHF, atrial fibrillation, dilated cardiomyopathy, hypertension and diabetes who follows up for vertigo.  UPDATE:  He is currently taking venlafaxine XR 75 mg daily.   He is doing well.  He has not had any recurrent extended flares.  He has mild dizziness lasting just a couple of minutes, which occur a couple of times a day and only are triggered by change in position.  They will be moving to [place], [place] to live near their daughter and grandchildren.  However, they still plan to come up to [facility] from time to time and would like to remain in our practice.  HISTORY:  In May 2017, he developed vertigo.  It was thought to be due to [facility], which he had been on for 6 months.  In addition, he developed a rash and pruritus.  Symptoms improved after discontinuation of the medication, but never completely resolved.  Initially he had it daily.  He described  it as an acute onset spinning sensation.  It usually was positional, such as turning over in bed or gazing up or down, but could be spontaneous as well.  Turning to the right was worse than to the left.  When he woke up in bed in the morning, he felt dizzy.  He staggers when he walks.  Spells typically lasted a few seconds but on some occasions lasted up to 30 minutes.  There was associated nausea and he would likely vomit if not for an antiemetic.  He reported some hearing loss in both ears, as well as tinnitus.  He denied associated lightheadedness, headache, double vision, unilateral facial or extremity numbness or weakness.  He is tried several therapies.  Meclizine and acupuncture provide mild relief.  He is also seen a Restaurant manager, fast food.  Vestibular rehab was ineffective.  Prednisone tapers help abort occasional flareups.  He was evaluated by ENT and underwent audiogram/ENG, which reportedly did not demonstrate peripheral etiology. 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 MRA of head showed no significant proximal or large intracranial vessel stenosis or occlusion. 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   He reports one prior episode of vertigo 10 years ago, a reaction to a cold medicine with codeine. He has atrial fibrillation and reports daily episodes of racing heartbeat lasting just seconds..  Past Medical History: Past Medical History:  Diagnosis  Date  . AF (atrial fibrillation) ([place])   . Allergic rhinitis, cause unspecified   . Arthritis    OA  . Asthma   . Chronic kidney disease    AT 72 PERCENMT MANAGED BY DR SHAW  . Hypertension   . Hypothyroidism   . OSA on CPAP   . PONV (postoperative nausea and vomiting)    WITH GENERAL PREFERS SPINAL  . Skin cancer    SQUAMOUS CELL ON TOP OF RIGHT EAR AND  BASAL CELL ON SHOULDRE  . Sore on leg    RIGHT LOWEER LEG HEALING X 3 WEEKS  . Type II or unspecified type diabetes mellitus without mention of complication, not stated as uncontrolled    TYPE 2  . Vertigo    LAST YEAR     Medications: Outpatient Encounter Medications as of 03/17/2019  Medication Sig  . albuterol (PROVENTIL HFA;VENTOLIN HFA) 108 (90 Base) MCG/ACT inhaler Inhale 2 puffs into the lungs every 6 (six) hours as needed for wheezing or shortness of breath.  . Alpha-D-Galactosidase (BEANO) TABS Take 1 tablet by mouth as needed (FOR GAS).   [name] amLODipine (NORVASC) 5 MG tablet Take 1 tablet (5 mg total) by mouth daily.  . ANDROGEL PUMP 20.25 MG/ACT (1.62%) GEL 4 pumps daily  . atorvastatin (LIPITOR) 80 MG tablet Take 80 mg by mouth at bedtime.   [name] azelastine (ASTELIN) 137 MCG/SPRAY nasal spray Place 1 spray into the nose daily as needed for rhinitis. Use in each nostril as directed   . budesonide-formoterol (SYMBICORT) 160-4.5 MCG/ACT inhaler Inhale 2 puffs into the lungs 2 (two) times daily. Use spacer  . Coenzyme Q10 (COQ10) 100 MG CAPS Take 100 mg by mouth daily.   [name] desonide (DESOWEN) 0.05 % cream Apply 1 application topically daily as needed (SKIN IRRITATION).   [name] ELIQUIS 5 MG TABS tablet Take 1 tablet by mouth 2 (two) times daily.  . fluticasone (FLONASE) 50 MCG/ACT nasal spray USE 2 SPRAYS IN EACH NOSTRIL DAILY AS NEEDED FOR ALLERGIES  . furosemide (LASIX) 20 MG tablet Take 20 mg by mouth daily.    . hydrochlorothiazide (HYDRODIURIL) 25 MG tablet Take 25 mg by mouth daily.  . hydrocortisone (ANUSOL-HC) 2.5 % rectal cream Place 1 application rectally 2 (two) times daily as needed for hemorrhoids.   . Insulin Degludec (TRESIBA FLEXTOUCH ) Inject 20 Units into the skin at bedtime.  [name] JARDIANCE 10 MG TABS tablet Take 10 mg by mouth daily.  [name] levothyroxine (SYNTHROID, LEVOTHROID) 50 MCG tablet Take 50 mcg by mouth daily.    . Loratadine (CLARITIN) 10 MG CAPS Take 1 capsule by  mouth at bedtime.   [name] losartan (COZAAR) 50 MG tablet Take 1 tablet (50 mg total) by mouth 2 (two) times daily.  [name] LUMIGAN 0.01 % SOLN Place 1 drop into both eyes at bedtime.  . meclizine (ANTIVERT) 25 MG tablet Take 25 mg by mouth 3 (three) times daily as needed.  . metaxalone (SKELAXIN) 800 MG tablet Take 1 tablet (800 mg total) by mouth 3 (three) times daily as needed for muscle spasms.  . metFORMIN (GLUCOPHAGE) 500 MG tablet Take 1,000 mg by mouth 2 (two) times daily with a meal.   . metoprolol succinate (TOPROL-XL) 50 MG 24 hr tablet Take one and one-half tablets by mouth in the morning and one tablet by mouth in the evening.  . modafinil (PROVIGIL) 200 MG tablet   . montelukast (SINGULAIR) 10 MG tablet Take 20 mg by mouth at bedtime.   [name]  OMEGA-3 KRILL OIL 300 MG CAPS Take 1 capsule by mouth daily.    [name] OZEMPIC 1 MG/DOSE SOPN Inject 1 mg once a week as directed.   . Pancrelipase, Lip-Prot-Amyl, (CREON) 24000 UNITS CPEP Take 24,000-48,000 Units by mouth 3 (three) times daily as needed (digestion).   . pantoprazole (PROTONIX) 40 MG tablet Take 40 mg daily by mouth.   . potassium chloride SA (K-DUR,KLOR-CON) 20 MEQ tablet Take 20 mEq by mouth daily.    . Psyllium (HYDROCIL PO) Take 30 grams in 8 oz of water daily  . Salicylic Acid 6 % LOTN Apply topically as directed.  . Simethicone (GAS-X PO) Take 180 mg by mouth as needed (gas).   [name] Spacer/Aero-Holding Chambers (AEROCHAMBER MV) inhaler Use as instructed  . venlafaxine XR (EFFEXOR-XR) 75 MG 24 hr capsule TAKE 1 CAPSULE DAILY  . venlafaxine XR (EFFEXOR-XR) 75 MG 24 hr capsule TAKE 1 CAPSULE DAILY WITH BREAKFAST   No facility-administered encounter medications on file as of 03/17/2019.     Allergies: No Known Allergies  Family History: Family History  Problem Relation Age of Onset  . Heart disease Mother        died at 72 resp failure  . Breast cancer Mother   . Emphysema Father        died at 97 resp failure  . Hypertension Sister      Social History: Social History   Socioeconomic History  . Marital status: Married    Spouse name: Not on file  . Number of children: Not on file  . Years of education: Not on file  . Highest education level: Not on file  Occupational History  . Occupation: Retired    Comment: Scientist, research (life sciences)  Social Needs  . Financial resource strain: Not on file  . Food insecurity:    Worry: Not on file    Inability: Not on file  . Transportation needs:    Medical: Not on file    Non-medical: Not on file  Tobacco Use  . Smoking status: Former Smoker    Packs/day: 1.00    Years: 26.00    Pack years: 26.00    Types: Cigarettes    Last attempt to quit: 11/20/1982    Years since quitting: 36.3  . Smokeless tobacco: Never Used  Substance and Sexual Activity  . Alcohol use: Yes    Comment: SOCIAL  . Drug use: No  . Sexual activity: Not on file  Lifestyle  . Physical activity:    Days per week: Not on file    Minutes per session: Not on file  . Stress: Not on file  Relationships  . Social connections:    Talks on phone: Not on file    Gets together: Not on file    Attends religious service: Not on file    Active member of club or organization: Not on file    Attends meetings of clubs or organizations: Not on file    Relationship status: Not on file  . Intimate partner violence:    Fear of current or ex partner: Not on file    Emotionally abused: Not on file    Physically abused: Not on file    Forced sexual activity: Not on file  Other Topics Concern  . Not on file  Social History Narrative  . Not on file   Observations/Objective:   There were no vitals filed for this visit.    Assessment and Plan:   Vertigo, now purely positional,  brief and manageable.  1.  Venlafaxine XR 75mg  daily 2.  Refilled Skelaxin for him as well until he sees his PCP. 3.  Follow up in 6 months.  Follow Up Instructions:    -I discussed the assessment and treatment plan with the  patient. The patient was provided an opportunity to ask questions and all were answered. The patient agreed with the plan and demonstrated an understanding of the instructions.   The patient was advised to call back or seek an in-person evaluation if the symptoms worsen or if the condition fails to improve as anticipated.  Dudley Major, DO

## 2019-03-17 ENCOUNTER — Encounter: Payer: Self-pay | Admitting: Neurology

## 2019-03-17 ENCOUNTER — Telehealth (INDEPENDENT_AMBULATORY_CARE_PROVIDER_SITE_OTHER): Payer: Medicare Other | Admitting: Neurology

## 2019-03-17 ENCOUNTER — Other Ambulatory Visit: Payer: Self-pay

## 2019-03-17 VITALS — BP 124/71 | HR 84 | Temp 98.0°F | Ht 69.0 in | Wt 225.0 lb

## 2019-03-17 DIAGNOSIS — R42 Dizziness and giddiness: Secondary | ICD-10-CM | POA: Diagnosis not present

## 2019-03-17 MED ORDER — METAXALONE 800 MG PO TABS: 800.0000 mg | ORAL_TABLET | Freq: Three times a day (TID) | ORAL | 1 refills | Status: AC | PRN

## 2019-03-17 NOTE — Patient Instructions (Signed)
Venlafaxine XR 75mg  daily Skelaxin refilled Follow up in 6 months

## 2019-03-19 ENCOUNTER — Ambulatory Visit: Payer: Medicare Other | Admitting: Neurology

## 2019-04-03 ENCOUNTER — Other Ambulatory Visit: Payer: Self-pay

## 2019-04-03 ENCOUNTER — Ambulatory Visit (INDEPENDENT_AMBULATORY_CARE_PROVIDER_SITE_OTHER): Payer: Medicare Other | Admitting: Internal Medicine

## 2019-04-03 ENCOUNTER — Encounter: Payer: Self-pay | Admitting: Internal Medicine

## 2019-04-03 VITALS — BP 120/68 | HR 68 | Ht 69.0 in | Wt 221.8 lb

## 2019-04-03 DIAGNOSIS — G4733 Obstructive sleep apnea (adult) (pediatric): Secondary | ICD-10-CM | POA: Diagnosis not present

## 2019-04-03 DIAGNOSIS — J4540 Moderate persistent asthma, uncomplicated: Secondary | ICD-10-CM

## 2019-04-03 NOTE — Progress Notes (Signed)
Subjective:    Patient ID: Colton Brooks, male    DOB: 09-25-1941, 78 y.o.   MRN: [id number]  HPI  M former smoker followed for dyspnea, OSA, allergic rhinitis, asthma, complicated by DM 2, chronic A. fib, gout Office Spirometry 11/26/14- Mild obstructive airways disease NPSG 09/20/06- AHI 53.8/ hr, desaturation to 80%, body weight 263 lbs ----------------------------------------------------------------------------------------------------------  03/07/18- 78 year old M former smoker followed for dyspnea, OSA, allergic rhinitis, asthma, complicated by DM 2, chronic A. fib, gout CPAP 13/[facility]>> today replace machine, auto 10-20 We had given  Symbicort 160 / spacer to see if less hoarseness than Breo ----OSA: DME: [facility] Pt Pt wears CPAP nightly and DL attached. Pt would like to discuss pressure increase. Vertigo problem was improved by Effexor/neurology-big improvement in quality of life. Symbicort with spacer worked very well to improve his hoarseness, compared with Breo. Asthma component is doing very well now with occasional need for rescue inhaler.  Asks for refills. CPAP doing quite well but machine is old and we would like to change to AutoPap.  04/03/2019- 78 year old M former smoker followed for dyspnea, OSA, allergic rhinitis, asthma, complicated by DM 2, chronic A. fib, gout Modafinil 200, Singulair, Symbicort 160, albuterol hfa, Astelin, Flonase CPAP  auto 10-20/ [facility] -----F/U re:OSA. He states he would like to increase his cpap pressure. No other concerns.  Body weight today 221 lbs More daytime sleepiness than he would like. Says pressure never seems to change.Uses every night. Otherwise breathing ok, table using Symbicort just 1 puff, once daily. He is moving to [place], [place], but wishes to keep his healcare here as he will be coming back and forth.  Review of Systems-See HPI    + = positive Constitutional:   No-   weight loss, night sweats, fevers, chills, +fatigue,  lassitude. HEENT:   No-  headaches, difficulty swallowing, tooth/dental problems, sore throat,       No-  sneezing, itching, ear ache,  +nasal congestion, post nasal drip,  CV:  No-   chest pain, orthopnea, PND, swelling in lower extremities, anasarca, dizziness, palpitations Resp: +shortness of breath with exertion.              No-   productive cough,  No non-productive cough,  No- coughing up of blood.              No-   change in color of mucus.  No- wheezing.   Skin: No-   rash or lesions. GI:  No-   heartburn, indigestion, abdominal pain, nausea, vomiting,  GU: No-   dysuria,  MS:  +Chronic joint pain or swelling.   Neuro-     nothing unusual Psych:  No- change in mood or affect. No depression or anxiety.  No memory loss.   Objective:   Physical Exam  General- Alert, Oriented, Affect-appropriate, Distress- none acute; +overweight Skin- rash-none, lesions- none, excoriation- none Lymphadenopathy- none Head- atraumatic            Eyes- Gross vision intact, PERRLA, conjunctivae clear secretions            Ears- Hearing, canals-normal            Nose- Clear, no-Septal dev, mucus, polyps, erosion, perforation             Throat- Mallampati III-IV , mucosa clear , drainage- none, tonsils- atrophic,+ mild hoarseness Neck- flexible , trachea midline, no stridor , thyroid nl, carotid no bruit Chest - symmetrical excursion , unlabored  Heart/CV- +nearly regular pulse/ Atrial fib , no murmur , no gallop  , no rub, nl s1 s2                           - JVD- none , edema-+1, stasis changes-+, varices- none           Lung- clear to P&A, wheeze- none, cough- none , dullness-none, rub- none           Chest wall-  Abd-  Br/ Gen/ Rectal- Not done, not indicated Extrem- cyanosis- none, clubbing, none, atrophy- none, strength- nl, +cane Neuro- grossly intact to observation

## 2019-04-03 NOTE — Patient Instructions (Signed)
Order- DME [facility]- please change CPAP to auto 10-20, continue mask of choice, humidifier, supplies, Airview  Phone number for [facility] [place] Patient  [phone]  As you move to [facility] and establish health care there, your new primary care physician should be able to help you find sleep medicine and pulmonary care in that area. In particular you will probably want to get a new [facility] for supplies and service for your CPAP machine.  We can continue to see you here as long as that meets your needs. Please call if we can help.

## 2019-04-28 ENCOUNTER — Ambulatory Visit: Payer: Medicare Other | Admitting: Neurology

## 2019-05-11 NOTE — Assessment & Plan Note (Signed)
He is apparently on fixed pressure. 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 Discussed getting a new DME local to his new address when he moves to [place]. Compliance has been very good.

## 2019-05-11 NOTE — Assessment & Plan Note (Signed)
Very well controlled. Ok to continue using Symbicort just once daily as long as this is sufficient. Recognize symptom overlap between asthma and his cardiovascular disease

## 2019-06-09 ENCOUNTER — Encounter: Payer: Self-pay | Admitting: Internal Medicine

## 2019-06-10 DIAGNOSIS — Z0000 Encounter for general adult medical examination without abnormal findings: Secondary | ICD-10-CM | POA: Diagnosis not present

## 2019-06-10 DIAGNOSIS — N183 Chronic kidney disease, stage 3 (moderate): Secondary | ICD-10-CM | POA: Diagnosis not present

## 2019-06-10 DIAGNOSIS — M109 Gout, unspecified: Secondary | ICD-10-CM | POA: Diagnosis not present

## 2019-06-10 DIAGNOSIS — E038 Other specified hypothyroidism: Secondary | ICD-10-CM | POA: Diagnosis not present

## 2019-06-10 DIAGNOSIS — E785 Hyperlipidemia, unspecified: Secondary | ICD-10-CM | POA: Diagnosis not present

## 2019-06-10 DIAGNOSIS — I130 Hypertensive heart and chronic kidney disease with heart failure and stage 1 through stage 4 chronic kidney disease, or unspecified chronic kidney disease: Secondary | ICD-10-CM | POA: Diagnosis not present

## 2019-06-10 DIAGNOSIS — E291 Testicular hypofunction: Secondary | ICD-10-CM | POA: Diagnosis not present

## 2019-06-10 DIAGNOSIS — R82998 Other abnormal findings in urine: Secondary | ICD-10-CM | POA: Diagnosis not present

## 2019-06-10 DIAGNOSIS — I480 Paroxysmal atrial fibrillation: Secondary | ICD-10-CM | POA: Diagnosis not present

## 2019-06-10 DIAGNOSIS — F325 Major depressive disorder, single episode, in full remission: Secondary | ICD-10-CM | POA: Diagnosis not present

## 2019-06-10 DIAGNOSIS — E7849 Other hyperlipidemia: Secondary | ICD-10-CM | POA: Diagnosis not present

## 2019-06-10 DIAGNOSIS — J45909 Unspecified asthma, uncomplicated: Secondary | ICD-10-CM | POA: Diagnosis not present

## 2019-06-10 DIAGNOSIS — G4733 Obstructive sleep apnea (adult) (pediatric): Secondary | ICD-10-CM | POA: Diagnosis not present

## 2019-06-10 DIAGNOSIS — I5022 Chronic systolic (congestive) heart failure: Secondary | ICD-10-CM | POA: Diagnosis not present

## 2019-06-10 DIAGNOSIS — E1129 Type 2 diabetes mellitus with other diabetic kidney complication: Secondary | ICD-10-CM | POA: Diagnosis not present

## 2019-06-10 DIAGNOSIS — I10 Essential (primary) hypertension: Secondary | ICD-10-CM | POA: Diagnosis not present

## 2019-06-10 DIAGNOSIS — Z1331 Encounter for screening for depression: Secondary | ICD-10-CM | POA: Diagnosis not present

## 2019-06-10 DIAGNOSIS — Z7901 Long term (current) use of anticoagulants: Secondary | ICD-10-CM | POA: Diagnosis not present

## 2019-06-10 DIAGNOSIS — Z125 Encounter for screening for malignant neoplasm of prostate: Secondary | ICD-10-CM | POA: Diagnosis not present

## 2019-06-11 ENCOUNTER — Other Ambulatory Visit: Payer: Self-pay

## 2019-06-11 ENCOUNTER — Encounter: Payer: Self-pay | Admitting: Internal Medicine

## 2019-06-11 ENCOUNTER — Ambulatory Visit (INDEPENDENT_AMBULATORY_CARE_PROVIDER_SITE_OTHER): Payer: Medicare Other | Admitting: Internal Medicine

## 2019-06-11 DIAGNOSIS — G4733 Obstructive sleep apnea (adult) (pediatric): Secondary | ICD-10-CM | POA: Diagnosis not present

## 2019-06-11 DIAGNOSIS — J4540 Moderate persistent asthma, uncomplicated: Secondary | ICD-10-CM

## 2019-06-11 NOTE — Progress Notes (Signed)
Subjective:    Patient ID: Colton Brooks, male    DOB: 1941-09-08, 78 y.o.   MRN: [id number]  HPI  M former smoker followed for dyspnea, OSA, allergic rhinitis, asthma, complicated by DM 2, chronic A. fib, gout Office Spirometry 11/26/14- Mild obstructive airways disease NPSG 09/20/06- AHI 53.8/ hr, desaturation to 80%, body weight 263 lbs --------------------------------------------------------------------------------------------------  04/03/2019- 78 year old M former smoker followed for dyspnea, OSA, allergic rhinitis, asthma, complicated by DM 2, chronic A. fib, gout Modafinil 200, Singulair, Symbicort 160, albuterol hfa, Astelin, Flonase CPAP  auto 10-20/ [facility] -----F/U re:OSA. He states he would like to increase his cpap pressure. No other concerns.  Body weight today 221 lbs More daytime sleepiness than he would like. Says pressure never seems to change.Uses every night. Otherwise breathing ok, table using Symbicort just 1 puff, once daily. He is moving to [place], [place], but wishes to keep his healthcare here as he will be coming back and forth.  06/11/2019- 78 year old M former smoker followed for dyspnea, OSA, allergic rhinitis, asthma, complicated by DM 2, chronic A. fib, gout Modafinil 200, Singulair, Symbicort 160, albuterol hfa, Astelin, Flonase CPAP  auto 10-20/ [facility] Download 93% compliance, AHI 1.8/ hr Modafinil 200, Singulair, Symbicort 160, albuterol hfa, Astelin, Flonase -----OSA on CPAP auto 10-20, DME: [facility]/ [facility]; no complaints, pt reports using machine every night Body weight 236 lbs Drove up from [place], [place] yesterday. Very pleased with replacement CPAP. Pressure good.  Asthma control very good, no exacerbations. Avoids NSAIDS/ ASA.  Review of Systems-See HPI    + = positive Constitutional:   No-   weight loss, night sweats, fevers, chills, +fatigue, lassitude. HEENT:   No-  headaches, difficulty swallowing, tooth/dental problems, sore throat,       No-  sneezing,  itching, ear ache,  +nasal congestion, post nasal drip,  CV:  No-   chest pain, orthopnea, PND, swelling in lower extremities, anasarca, dizziness, palpitations Resp: +shortness of breath with exertion.              No-   productive cough,  No non-productive cough,  No- coughing up of blood.              No-   change in color of mucus.  No- wheezing.   Skin: No-   rash or lesions. GI:  No-   heartburn, indigestion, abdominal pain, nausea, vomiting,  GU: No-   dysuria,  MS:  +Chronic joint pain or swelling.   Neuro-     nothing unusual Psych:  No- change in mood or affect. No depression or anxiety.  No memory loss.   Objective:   Physical Exam  General- Alert, Oriented, Affect-appropriate, Distress- none acute; +overweight Skin- rash-none, lesions- none, excoriation- none Lymphadenopathy- none Head- atraumatic            Eyes- Gross vision intact, PERRLA, conjunctivae clear secretions            Ears- Hearing, canals-normal            Nose- Clear, no-Septal dev, mucus, polyps, erosion, perforation             Throat- Mallampati III-IV , mucosa clear , drainage- none, tonsils- atrophic,+ mild hoarseness Neck- flexible , trachea midline, no stridor , thyroid nl, carotid no bruit Chest - symmetrical excursion , unlabored           Heart/CV- +nearly regular pulse/ Atrial fib , no murmur , no gallop  , no rub, nl s1 s2                           -  JVD- none , edema-+1, stasis changes-+, varices- none           Lung- clear to P&A, wheeze- none, cough- none , dullness-none, rub- none           Chest wall-  Abd-  Br/ Gen/ Rectal- Not done, not indicated Extrem- cyanosis- none, clubbing, none, atrophy- none, strength- nl, +cane Neuro- grossly intact to observation

## 2019-06-11 NOTE — Patient Instructions (Signed)
We can continue CPAP auto 10-20, mask of choice, humidifier, supplies, AirView/ card  Ok to continue current asthma meds  Please cal if we can help

## 2019-06-26 ENCOUNTER — Ambulatory Visit: Payer: Medicare Other | Admitting: Internal Medicine

## 2019-07-29 NOTE — Assessment & Plan Note (Signed)
Well- controlled an uncomplicated this summer. Discussed meds.He avoids ASA/ NSAIDS, which triigger.

## 2019-07-29 NOTE — Assessment & Plan Note (Signed)
CPAP continues to help.  Plan- continue auto 10-20.

## 2019-08-15 DIAGNOSIS — L918 Other hypertrophic disorders of the skin: Secondary | ICD-10-CM | POA: Diagnosis not present

## 2019-08-15 DIAGNOSIS — Z85828 Personal history of other malignant neoplasm of skin: Secondary | ICD-10-CM | POA: Diagnosis not present

## 2019-08-15 DIAGNOSIS — L218 Other seborrheic dermatitis: Secondary | ICD-10-CM | POA: Diagnosis not present

## 2019-08-15 DIAGNOSIS — L84 Corns and callosities: Secondary | ICD-10-CM | POA: Diagnosis not present

## 2019-08-15 DIAGNOSIS — Z23 Encounter for immunization: Secondary | ICD-10-CM | POA: Diagnosis not present

## 2019-08-15 DIAGNOSIS — C44629 Squamous cell carcinoma of skin of left upper limb, including shoulder: Secondary | ICD-10-CM | POA: Diagnosis not present

## 2019-08-15 DIAGNOSIS — L821 Other seborrheic keratosis: Secondary | ICD-10-CM | POA: Diagnosis not present

## 2019-08-15 DIAGNOSIS — D485 Neoplasm of uncertain behavior of skin: Secondary | ICD-10-CM | POA: Diagnosis not present

## 2019-09-04 DIAGNOSIS — Z23 Encounter for immunization: Secondary | ICD-10-CM | POA: Diagnosis not present

## 2019-09-04 DIAGNOSIS — E349 Endocrine disorder, unspecified: Secondary | ICD-10-CM | POA: Diagnosis not present

## 2019-09-04 DIAGNOSIS — E7801 Familial hypercholesterolemia: Secondary | ICD-10-CM | POA: Diagnosis not present

## 2019-09-04 DIAGNOSIS — E1165 Type 2 diabetes mellitus with hyperglycemia: Secondary | ICD-10-CM | POA: Diagnosis not present

## 2019-09-04 DIAGNOSIS — I10 Essential (primary) hypertension: Secondary | ICD-10-CM | POA: Diagnosis not present

## 2019-09-04 DIAGNOSIS — E039 Hypothyroidism, unspecified: Secondary | ICD-10-CM | POA: Diagnosis not present

## 2019-09-15 NOTE — Progress Notes (Signed)
Virtual Visit via Video Note The purpose of this virtual visit is to provide medical care while limiting exposure to the novel coronavirus.    Consent was obtained for video visit:  Yes.   Answered questions that patient had about telehealth interaction:  Yes.   I discussed the limitations, risks, security and privacy concerns of performing an evaluation and management service by telemedicine. I also discussed with the patient that there may be a patient responsible charge related to this service. The patient expressed understanding and agreed to proceed.  Pt location: Home Physician Location: office Name of referring provider:  Marton Redwood, MD I connected with Marline Backbone at patients initiation/request on 09/16/2019 at  9:50 AM EDT by video enabled telemedicine application and verified that I am speaking with the correct person using two identifiers. Pt MRN:  [id number] Pt DOB:  [birth year] Video Participants:  Marline Backbone;  His wife   History of Present Illness:  [name] is an 78 year old right-handed man with chronic CHF, atrial fibrillation, dilated cardiomyopathy, hypertension and diabetes who follows up for vertigo.  UPDATE: Current medication: venlafaxine XR 75 mg daily.   He has brief dizziness if he looks up or down, but otherwise no extended episodes.  HISTORY:  In May 2017, he developed vertigo.  It was thought to be due to [facility], which he had been on for 6 months.  In addition, he developed a rash and pruritus.  Symptoms improved after discontinuation of the medication, but never completely resolved.  Initially he had it daily.  He described it as an acute onset spinning sensation.  It usually was positional, such as turning over in bed or gazing up or down, but could be spontaneous as well.  Turning to the right was worse than to the left.  When he woke up in bed in the morning, he felt dizzy.  He staggers when he walks.  Spells typically lasted a few  seconds but on some occasions lasted up to 30 minutes.  There was associated nausea and he would likely vomit if not for an antiemetic.  He reported some hearing loss in both ears, as well as tinnitus.  He denied associated lightheadedness, headache, double vision, unilateral facial or extremity numbness or weakness.  He is tried several therapies.  Meclizine and acupuncture provide mild relief.  He is also seen a Restaurant manager, fast food.  Vestibular rehab was ineffective.  Prednisone tapers help abort occasional flareups.  He was evaluated by ENT and underwent audiogram/ENG, which reportedly did not demonstrate peripheral etiology. 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 MRA of head showed no significant proximal or large intracranial vessel stenosis or occlusion. 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   He reports one prior episode of vertigo 10 years ago, a reaction to a cold medicine with codeine. He has atrial fibrillation and reports daily episodes of racing heartbeat lasting just seconds..  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   Alpha-D-Galactosidase (BEANO) TABS Take 1 tablet by mouth as needed (FOR GAS).    amLODipine (NORVASC) 5 MG tablet Take 1 tablet (5 mg total) by mouth daily.   ANDROGEL PUMP 20.25 MG/ACT (1.62%) GEL 4 pumps daily   atorvastatin (LIPITOR) 80 MG tablet Take 80 mg by mouth at bedtime.    azelastine (ASTELIN) 137 MCG/SPRAY nasal spray Place 1 spray into the nose daily as needed for rhinitis. Use in each nostril as directed    budesonide-formoterol (SYMBICORT) 160-4.5 MCG/ACT inhaler Inhale 2 puffs into the lungs 2 (two) times daily. Use spacer   Coenzyme Q10 (COQ10) 100 MG CAPS Take 100 mg by mouth daily.    desonide (DESOWEN) 0.05 % cream Apply 1 application topically daily as needed (SKIN IRRITATION).    ELIQUIS 5 MG TABS tablet Take 1 tablet by mouth 2 (two) times daily.   fluticasone (FLONASE) 50 MCG/ACT nasal spray USE 2 SPRAYS IN EACH NOSTRIL DAILY AS NEEDED FOR ALLERGIES   furosemide (LASIX) 20 MG tablet Take 20 mg by mouth daily.     hydrochlorothiazide (HYDRODIURIL) 25 MG tablet Take 25 mg by mouth daily.   hydrocortisone (ANUSOL-HC) 2.5 % rectal cream Place 1 application rectally 2 (two) times daily as needed for hemorrhoids.    Insulin Degludec (TRESIBA FLEXTOUCH [place]) Inject 20 Units into the skin at bedtime.   JARDIANCE 10 MG TABS tablet Take 10 mg by mouth daily.   levothyroxine (SYNTHROID, LEVOTHROID) 50 MCG tablet Take 50 mcg by mouth daily.     Loratadine (CLARITIN) 10 MG CAPS Take 1 capsule by mouth at bedtime.    losartan (COZAAR) 50 MG tablet Take 1 tablet (50 mg total) by mouth 2 (two) times daily.   LUMIGAN 0.01 % SOLN Place 1 drop into both eyes at bedtime.   metaxalone (SKELAXIN) 800 MG tablet Take 1 tablet (800 mg total) by mouth 3 (three) times daily as needed for muscle spasms.    metFORMIN (GLUCOPHAGE) 500 MG tablet Take 1,000 mg by mouth 2 (two) times daily with a meal.    metoprolol succinate (TOPROL-XL) 50 MG 24 hr tablet Take one and one-half tablets by mouth in the morning and one tablet by mouth in the evening.   modafinil (PROVIGIL) 200 MG tablet    montelukast (SINGULAIR) 10 MG tablet Take 20 mg by mouth at bedtime.    OMEGA-3 KRILL OIL 300 MG CAPS Take 1 capsule by mouth daily.     OZEMPIC 1 MG/DOSE SOPN Inject 1 mg once a week as directed.    Pancrelipase, Lip-Prot-Amyl, (CREON) 24000 UNITS CPEP Take 24,000-48,000 Units by mouth 3 (three) times daily as needed (digestion).    potassium chloride SA (K-DUR,KLOR-CON) 20 MEQ tablet Take 20 mEq by mouth daily.     Psyllium (HYDROCIL PO) Take 30 grams in 8 oz of  water daily   Salicylic Acid 6 % LOTN Apply topically as directed.   Simethicone (GAS-X PO) Take 180 mg by mouth as needed (gas).    Spacer/Aero-Holding Chambers (AEROCHAMBER MV) inhaler Use as instructed   venlafaxine XR (EFFEXOR-XR) 75 MG 24 hr capsule TAKE 1 CAPSULE DAILY   venlafaxine XR (EFFEXOR-XR) 75 MG 24 hr capsule TAKE 1 CAPSULE DAILY WITH BREAKFAST   No facility-administered encounter medications on file as of 09/16/2019.     Allergies: No Known Allergies  Family History: Family History  Problem Relation Age of Onset   Heart disease Mother        died at 55 resp failure   Breast cancer Mother    Emphysema Father        died at 65 resp failure   Hypertension Sister     Social History: Social History   Socioeconomic History   Marital status: Married    Spouse name: Not on file   Number of children: Not on file   Years of education: Not on file   Highest education level: Not on file  Occupational History   Occupation: Retired    Comment: Advertising account planner strain: Not on file   Food insecurity    Worry: Not on file    Inability: Not on file   Transportation  needs    Medical: Not on file    Non-medical: Not on file  Tobacco Use   Smoking status: Former Smoker    Packs/day: 1.00    Years: 26.00    Pack years: 26.00    Types: Cigarettes    Quit date: 11/20/1982    Years since quitting: 36.8   Smokeless tobacco: Never Used  Substance and Sexual Activity   Alcohol use: Yes    Comment: SOCIAL   Drug use: No   Sexual activity: Not on file  Lifestyle   Physical activity    Days per week: Not on file    Minutes per session: Not on file   Stress: Not on file  Relationships   Social connections    Talks on phone: Not on file    Gets together: Not on file    Attends religious service: Not on file    Active member of club or organization: Not on file    Attends meetings of clubs or organizations: Not on file    Relationship status: Not on file   Intimate partner violence    Fear of current or ex partner: Not on file    Emotionally abused: Not on file    Physically abused: Not on file    Forced sexual activity: Not on file  Other Topics Concern   Not on file  Social History Narrative   Not on file    Observations/Objective:   Height 5' 9.5" (1.765 m), weight 225 lb (102.1 kg). No acute distress.  Alert and oriented.  Speech fluent and not dysarthric.  Language intact.  Eyes orthophoric on primary gaze.  Face symmetric.  Assessment and Plan:   Vertigo.  1.  Venlafaxine XR 75mg  daily 2.  Follow up in one year  Follow Up Instructions:    -I discussed the assessment and treatment plan with the patient. The patient was provided an opportunity to ask questions and all were answered. The patient agreed with the plan and demonstrated an understanding of the instructions.   The patient was advised to call back or seek an in-person evaluation if  the symptoms worsen or if the condition fails to improve as anticipated.    Total Time spent in visit with the patient was:  9 minutes   Dudley Major, DO

## 2019-09-16 ENCOUNTER — Other Ambulatory Visit: Payer: Self-pay

## 2019-09-16 ENCOUNTER — Telehealth (INDEPENDENT_AMBULATORY_CARE_PROVIDER_SITE_OTHER): Payer: Medicare Other | Admitting: Neurology

## 2019-09-16 ENCOUNTER — Encounter: Payer: Self-pay | Admitting: Neurology

## 2019-09-16 VITALS — Ht 69.5 in | Wt 225.0 lb

## 2019-09-16 DIAGNOSIS — R42 Dizziness and giddiness: Secondary | ICD-10-CM

## 2019-10-27 DIAGNOSIS — H04123 Dry eye syndrome of bilateral lacrimal glands: Secondary | ICD-10-CM | POA: Diagnosis not present

## 2019-10-27 DIAGNOSIS — H11123 Conjunctival concretions, bilateral: Secondary | ICD-10-CM | POA: Diagnosis not present

## 2019-10-27 DIAGNOSIS — H401231 Low-tension glaucoma, bilateral, mild stage: Secondary | ICD-10-CM | POA: Diagnosis not present

## 2019-10-27 DIAGNOSIS — E119 Type 2 diabetes mellitus without complications: Secondary | ICD-10-CM | POA: Diagnosis not present

## 2019-10-28 DIAGNOSIS — E1165 Type 2 diabetes mellitus with hyperglycemia: Secondary | ICD-10-CM | POA: Diagnosis not present

## 2019-10-28 DIAGNOSIS — E349 Endocrine disorder, unspecified: Secondary | ICD-10-CM | POA: Diagnosis not present

## 2019-11-03 DIAGNOSIS — I482 Chronic atrial fibrillation, unspecified: Secondary | ICD-10-CM | POA: Diagnosis not present

## 2019-11-03 DIAGNOSIS — I10 Essential (primary) hypertension: Secondary | ICD-10-CM | POA: Diagnosis not present

## 2019-11-03 DIAGNOSIS — Z7901 Long term (current) use of anticoagulants: Secondary | ICD-10-CM | POA: Diagnosis not present

## 2019-11-03 DIAGNOSIS — Z136 Encounter for screening for cardiovascular disorders: Secondary | ICD-10-CM | POA: Diagnosis not present

## 2019-11-03 DIAGNOSIS — E785 Hyperlipidemia, unspecified: Secondary | ICD-10-CM | POA: Diagnosis not present

## 2020-01-21 ENCOUNTER — Other Ambulatory Visit: Payer: Self-pay | Admitting: Neurology

## 2020-02-04 DIAGNOSIS — E039 Hypothyroidism, unspecified: Secondary | ICD-10-CM | POA: Diagnosis not present

## 2020-02-04 DIAGNOSIS — I10 Essential (primary) hypertension: Secondary | ICD-10-CM | POA: Diagnosis not present

## 2020-02-04 DIAGNOSIS — E1165 Type 2 diabetes mellitus with hyperglycemia: Secondary | ICD-10-CM | POA: Diagnosis not present

## 2020-02-05 DIAGNOSIS — M25562 Pain in left knee: Secondary | ICD-10-CM | POA: Diagnosis not present

## 2020-02-27 DIAGNOSIS — M25562 Pain in left knee: Secondary | ICD-10-CM | POA: Diagnosis not present

## 2020-06-07 ENCOUNTER — Telehealth: Payer: Self-pay | Admitting: Internal Medicine

## 2020-06-07 NOTE — Telephone Encounter (Signed)
Sorry - I don't know anybody there. I usually suggest establishing with a local primary care practice. They can refill standard meds and make local specialist referrals. If you have friends or family there, they could ask their own doctor for a suggestion for you.

## 2020-06-07 NOTE — Telephone Encounter (Signed)
Spoke with pt's spouse, aware of recs.  Nothing further needed at this time- will close encounter.   

## 2020-06-07 NOTE — Telephone Encounter (Signed)
Dr. Annamaria Boots ---pts wife is wanting to know if you could rec a pulmonologist in [place], [place].  Please advise. Thanks

## 2020-06-10 ENCOUNTER — Ambulatory Visit: Payer: Medicare Other | Admitting: Internal Medicine

## 2020-09-17 ENCOUNTER — Ambulatory Visit: Payer: Medicare Other | Admitting: Neurology

## 2021-01-28 ENCOUNTER — Other Ambulatory Visit: Payer: Self-pay | Admitting: Neurology
